# Patient Record
Sex: Male | Born: 1955 | Race: White | Hispanic: No | Smoking: Former smoker
Health system: Southern US, Community
[De-identification: ages and names within clinical notes are randomized; demographics above are authoritative.]

---

## 2021-10-18 ENCOUNTER — Inpatient Hospital Stay (HOSPITAL_COMMUNITY)
Admission: EM | Admit: 2021-10-18 | Discharge: 2021-10-21 | DRG: 291 | Disposition: A | Discharge status: Home / Self Care | Payer: Medicare Other | Attending: Family Medicine | Admitting: Family Medicine

## 2021-10-18 ENCOUNTER — Other Ambulatory Visit: Payer: Self-pay

## 2021-10-18 ENCOUNTER — Emergency Department (HOSPITAL_COMMUNITY): Payer: Medicare Other

## 2021-10-18 ENCOUNTER — Encounter (HOSPITAL_COMMUNITY): Payer: Self-pay

## 2021-10-18 DIAGNOSIS — I110 Hypertensive heart disease with heart failure: Secondary | ICD-10-CM | POA: Diagnosis not present

## 2021-10-18 DIAGNOSIS — I130 Hypertensive heart and chronic kidney disease with heart failure and stage 1 through stage 4 chronic kidney disease, or unspecified chronic kidney disease: Secondary | ICD-10-CM | POA: Diagnosis not present

## 2021-10-18 DIAGNOSIS — N1831 Chronic kidney disease, stage 3a: Secondary | ICD-10-CM | POA: Diagnosis present

## 2021-10-18 DIAGNOSIS — F1011 Alcohol abuse, in remission: Secondary | ICD-10-CM | POA: Diagnosis present

## 2021-10-18 DIAGNOSIS — T465X6A Underdosing of other antihypertensive drugs, initial encounter: Secondary | ICD-10-CM | POA: Diagnosis present

## 2021-10-18 DIAGNOSIS — N179 Acute kidney failure, unspecified: Secondary | ICD-10-CM | POA: Diagnosis present

## 2021-10-18 DIAGNOSIS — I160 Hypertensive urgency: Secondary | ICD-10-CM | POA: Diagnosis present

## 2021-10-18 DIAGNOSIS — I517 Cardiomegaly: Secondary | ICD-10-CM | POA: Diagnosis not present

## 2021-10-18 DIAGNOSIS — I43 Cardiomyopathy in diseases classified elsewhere: Secondary | ICD-10-CM | POA: Diagnosis present

## 2021-10-18 DIAGNOSIS — Z20822 Contact with and (suspected) exposure to covid-19: Secondary | ICD-10-CM | POA: Diagnosis present

## 2021-10-18 DIAGNOSIS — R7989 Other specified abnormal findings of blood chemistry: Secondary | ICD-10-CM | POA: Diagnosis present

## 2021-10-18 DIAGNOSIS — I509 Heart failure, unspecified: Secondary | ICD-10-CM | POA: Diagnosis not present

## 2021-10-18 DIAGNOSIS — Z91138 Patient's unintentional underdosing of medication regimen for other reason: Secondary | ICD-10-CM

## 2021-10-18 DIAGNOSIS — F17210 Nicotine dependence, cigarettes, uncomplicated: Secondary | ICD-10-CM | POA: Diagnosis present

## 2021-10-18 DIAGNOSIS — I7121 Aneurysm of the ascending aorta, without rupture: Secondary | ICD-10-CM | POA: Diagnosis present

## 2021-10-18 DIAGNOSIS — J811 Chronic pulmonary edema: Secondary | ICD-10-CM | POA: Diagnosis not present

## 2021-10-18 DIAGNOSIS — R0602 Shortness of breath: Secondary | ICD-10-CM | POA: Diagnosis not present

## 2021-10-18 DIAGNOSIS — I5041 Acute combined systolic (congestive) and diastolic (congestive) heart failure: Secondary | ICD-10-CM | POA: Diagnosis present

## 2021-10-18 DIAGNOSIS — R778 Other specified abnormalities of plasma proteins: Secondary | ICD-10-CM | POA: Diagnosis present

## 2021-10-18 DIAGNOSIS — F1410 Cocaine abuse, uncomplicated: Secondary | ICD-10-CM | POA: Diagnosis present

## 2021-10-18 DIAGNOSIS — N1832 Chronic kidney disease, stage 3b: Secondary | ICD-10-CM | POA: Diagnosis present

## 2021-10-18 DIAGNOSIS — I248 Other forms of acute ischemic heart disease: Secondary | ICD-10-CM | POA: Diagnosis present

## 2021-10-18 DIAGNOSIS — E876 Hypokalemia: Secondary | ICD-10-CM | POA: Diagnosis present

## 2021-10-18 NOTE — ED Notes (Signed)
Blue Top tube sent to lab.

## 2021-10-18 NOTE — ED Triage Notes (Signed)
Pt complains of SHOB x 2 days. Pt states that he has high blood pressure and has been taking his medication.

## 2021-10-19 ENCOUNTER — Observation Stay (HOSPITAL_COMMUNITY): Payer: Medicare Other

## 2021-10-19 ENCOUNTER — Encounter (HOSPITAL_COMMUNITY): Payer: Self-pay | Admitting: Internal Medicine

## 2021-10-19 DIAGNOSIS — Z91138 Patient's unintentional underdosing of medication regimen for other reason: Secondary | ICD-10-CM | POA: Diagnosis not present

## 2021-10-19 DIAGNOSIS — I428 Other cardiomyopathies: Secondary | ICD-10-CM | POA: Diagnosis not present

## 2021-10-19 DIAGNOSIS — E876 Hypokalemia: Secondary | ICD-10-CM | POA: Diagnosis present

## 2021-10-19 DIAGNOSIS — F1410 Cocaine abuse, uncomplicated: Secondary | ICD-10-CM | POA: Diagnosis present

## 2021-10-19 DIAGNOSIS — I160 Hypertensive urgency: Secondary | ICD-10-CM | POA: Diagnosis present

## 2021-10-19 DIAGNOSIS — I509 Heart failure, unspecified: Secondary | ICD-10-CM

## 2021-10-19 DIAGNOSIS — R7989 Other specified abnormal findings of blood chemistry: Secondary | ICD-10-CM

## 2021-10-19 DIAGNOSIS — I5041 Acute combined systolic (congestive) and diastolic (congestive) heart failure: Secondary | ICD-10-CM | POA: Diagnosis present

## 2021-10-19 DIAGNOSIS — F17210 Nicotine dependence, cigarettes, uncomplicated: Secondary | ICD-10-CM

## 2021-10-19 DIAGNOSIS — Z20822 Contact with and (suspected) exposure to covid-19: Secondary | ICD-10-CM | POA: Diagnosis present

## 2021-10-19 DIAGNOSIS — I43 Cardiomyopathy in diseases classified elsewhere: Secondary | ICD-10-CM | POA: Diagnosis present

## 2021-10-19 DIAGNOSIS — N1831 Chronic kidney disease, stage 3a: Secondary | ICD-10-CM | POA: Diagnosis present

## 2021-10-19 DIAGNOSIS — I10 Essential (primary) hypertension: Secondary | ICD-10-CM | POA: Insufficient documentation

## 2021-10-19 DIAGNOSIS — R778 Other specified abnormalities of plasma proteins: Secondary | ICD-10-CM | POA: Diagnosis not present

## 2021-10-19 DIAGNOSIS — I5021 Acute systolic (congestive) heart failure: Secondary | ICD-10-CM | POA: Diagnosis not present

## 2021-10-19 DIAGNOSIS — N179 Acute kidney failure, unspecified: Secondary | ICD-10-CM | POA: Diagnosis not present

## 2021-10-19 DIAGNOSIS — N1832 Chronic kidney disease, stage 3b: Secondary | ICD-10-CM | POA: Diagnosis present

## 2021-10-19 DIAGNOSIS — F1011 Alcohol abuse, in remission: Secondary | ICD-10-CM | POA: Diagnosis present

## 2021-10-19 DIAGNOSIS — I5031 Acute diastolic (congestive) heart failure: Secondary | ICD-10-CM

## 2021-10-19 DIAGNOSIS — J9811 Atelectasis: Secondary | ICD-10-CM | POA: Diagnosis not present

## 2021-10-19 DIAGNOSIS — J439 Emphysema, unspecified: Secondary | ICD-10-CM | POA: Diagnosis not present

## 2021-10-19 DIAGNOSIS — J90 Pleural effusion, not elsewhere classified: Secondary | ICD-10-CM | POA: Diagnosis not present

## 2021-10-19 DIAGNOSIS — T465X6A Underdosing of other antihypertensive drugs, initial encounter: Secondary | ICD-10-CM | POA: Diagnosis present

## 2021-10-19 DIAGNOSIS — I248 Other forms of acute ischemic heart disease: Secondary | ICD-10-CM | POA: Diagnosis present

## 2021-10-19 DIAGNOSIS — I7121 Aneurysm of the ascending aorta, without rupture: Secondary | ICD-10-CM | POA: Diagnosis not present

## 2021-10-19 DIAGNOSIS — I130 Hypertensive heart and chronic kidney disease with heart failure and stage 1 through stage 4 chronic kidney disease, or unspecified chronic kidney disease: Secondary | ICD-10-CM | POA: Diagnosis present

## 2021-10-19 DIAGNOSIS — I700 Atherosclerosis of aorta: Secondary | ICD-10-CM | POA: Diagnosis not present

## 2021-10-19 HISTORY — DX: Other specified abnormal findings of blood chemistry: R79.89

## 2021-10-19 HISTORY — DX: Other specified abnormalities of plasma proteins: R77.8

## 2021-10-19 HISTORY — DX: Hypertensive urgency: I16.0

## 2021-10-19 HISTORY — DX: Chronic kidney disease, stage 3a: N18.31

## 2021-10-19 HISTORY — DX: Cocaine abuse, uncomplicated: F14.10

## 2021-10-19 HISTORY — DX: Hypokalemia: E87.6

## 2021-10-19 HISTORY — DX: Nicotine dependence, cigarettes, uncomplicated: F17.210

## 2021-10-19 HISTORY — DX: Essential (primary) hypertension: I10

## 2021-10-19 HISTORY — DX: Heart failure, unspecified: I50.9

## 2021-10-19 LAB — CBC WITH DIFFERENTIAL/PLATELET
Abs Immature Granulocytes: 0.03 10*3/uL (ref 0.00–0.07)
Abs Immature Granulocytes: 0.05 10*3/uL (ref 0.00–0.07)
Basophils Absolute: 0.1 10*3/uL (ref 0.0–0.1)
Basophils Absolute: 0.1 10*3/uL (ref 0.0–0.1)
Basophils Relative: 1 %
Basophils Relative: 1 %
Eosinophils Absolute: 0.2 10*3/uL (ref 0.0–0.5)
Eosinophils Absolute: 0.2 10*3/uL (ref 0.0–0.5)
Eosinophils Relative: 1 %
Eosinophils Relative: 2 %
HCT: 44.1 % (ref 39.0–52.0)
HCT: 48.7 % (ref 39.0–52.0)
Hemoglobin: 14.9 g/dL (ref 13.0–17.0)
Hemoglobin: 16.1 g/dL (ref 13.0–17.0)
Immature Granulocytes: 0 %
Immature Granulocytes: 0 %
Lymphocytes Relative: 17 %
Lymphocytes Relative: 19 %
Lymphs Abs: 2.3 10*3/uL (ref 0.7–4.0)
Lymphs Abs: 2.5 10*3/uL (ref 0.7–4.0)
MCH: 28.6 pg (ref 26.0–34.0)
MCH: 28.7 pg (ref 26.0–34.0)
MCHC: 33.1 g/dL (ref 30.0–36.0)
MCHC: 33.8 g/dL (ref 30.0–36.0)
MCV: 84.8 fL (ref 80.0–100.0)
MCV: 86.5 fL (ref 80.0–100.0)
Monocytes Absolute: 0.7 10*3/uL (ref 0.1–1.0)
Monocytes Absolute: 0.7 10*3/uL (ref 0.1–1.0)
Monocytes Relative: 5 %
Monocytes Relative: 6 %
Neutro Abs: 11.3 10*3/uL — ABNORMAL HIGH (ref 1.7–7.7)
Neutro Abs: 8.8 10*3/uL — ABNORMAL HIGH (ref 1.7–7.7)
Neutrophils Relative %: 72 %
Neutrophils Relative %: 76 %
Platelets: 190 10*3/uL (ref 150–400)
Platelets: 224 10*3/uL (ref 150–400)
RBC: 5.2 MIL/uL (ref 4.22–5.81)
RBC: 5.63 MIL/uL (ref 4.22–5.81)
RDW: 14 % (ref 11.5–15.5)
RDW: 14.2 % (ref 11.5–15.5)
WBC: 12.1 10*3/uL — ABNORMAL HIGH (ref 4.0–10.5)
WBC: 14.9 10*3/uL — ABNORMAL HIGH (ref 4.0–10.5)
nRBC: 0 % (ref 0.0–0.2)
nRBC: 0 % (ref 0.0–0.2)

## 2021-10-19 LAB — RAPID URINE DRUG SCREEN, HOSP PERFORMED
Amphetamines: NOT DETECTED
Barbiturates: NOT DETECTED
Benzodiazepines: NOT DETECTED
Cocaine: NOT DETECTED
Opiates: NOT DETECTED
Tetrahydrocannabinol: NOT DETECTED

## 2021-10-19 LAB — ECHOCARDIOGRAM COMPLETE
Area-P 1/2: 3.93 cm2
Calc EF: 35.7 %
Height: 75 in
P 1/2 time: 610 msec
S' Lateral: 5.5 cm
Single Plane A2C EF: 42.1 %
Single Plane A4C EF: 26.1 %
Weight: 4000 oz

## 2021-10-19 LAB — COMPREHENSIVE METABOLIC PANEL
ALT: 25 U/L (ref 0–44)
ALT: 28 U/L (ref 0–44)
AST: 23 U/L (ref 15–41)
AST: 26 U/L (ref 15–41)
Albumin: 4.2 g/dL (ref 3.5–5.0)
Albumin: 4.5 g/dL (ref 3.5–5.0)
Alkaline Phosphatase: 70 U/L (ref 38–126)
Alkaline Phosphatase: 73 U/L (ref 38–126)
Anion gap: 10 (ref 5–15)
Anion gap: 7 (ref 5–15)
BUN: 34 mg/dL — ABNORMAL HIGH (ref 8–23)
BUN: 37 mg/dL — ABNORMAL HIGH (ref 8–23)
CO2: 24 mmol/L (ref 22–32)
CO2: 27 mmol/L (ref 22–32)
Calcium: 9.3 mg/dL (ref 8.9–10.3)
Calcium: 9.6 mg/dL (ref 8.9–10.3)
Chloride: 105 mmol/L (ref 98–111)
Chloride: 106 mmol/L (ref 98–111)
Creatinine, Ser: 1.7 mg/dL — ABNORMAL HIGH (ref 0.61–1.24)
Creatinine, Ser: 1.72 mg/dL — ABNORMAL HIGH (ref 0.61–1.24)
GFR, Estimated: 44 mL/min — ABNORMAL LOW (ref >60)
GFR, Estimated: 44 mL/min — ABNORMAL LOW (ref >60)
Glucose, Bld: 110 mg/dL — ABNORMAL HIGH (ref 70–99)
Glucose, Bld: 132 mg/dL — ABNORMAL HIGH (ref 70–99)
Potassium: 3.3 mmol/L — ABNORMAL LOW (ref 3.5–5.1)
Potassium: 3.5 mmol/L (ref 3.5–5.1)
Sodium: 139 mmol/L (ref 135–145)
Sodium: 140 mmol/L (ref 135–145)
Total Bilirubin: 1.7 mg/dL — ABNORMAL HIGH (ref 0.3–1.2)
Total Bilirubin: 2.1 mg/dL — ABNORMAL HIGH (ref 0.3–1.2)
Total Protein: 7.6 g/dL (ref 6.5–8.1)
Total Protein: 8 g/dL (ref 6.5–8.1)

## 2021-10-19 LAB — URINALYSIS, ROUTINE W REFLEX MICROSCOPIC
Bilirubin Urine: NEGATIVE
Glucose, UA: NEGATIVE mg/dL
Hgb urine dipstick: NEGATIVE
Ketones, ur: NEGATIVE mg/dL
Leukocytes,Ua: NEGATIVE
Nitrite: NEGATIVE
Protein, ur: NEGATIVE mg/dL
Specific Gravity, Urine: 1.004 — ABNORMAL LOW (ref 1.005–1.030)
pH: 7 (ref 5.0–8.0)

## 2021-10-19 LAB — RESP PANEL BY RT-PCR (FLU A&B, COVID) ARPGX2
Influenza A by PCR: NEGATIVE
Influenza B by PCR: NEGATIVE
SARS Coronavirus 2 by RT PCR: NEGATIVE

## 2021-10-19 LAB — TROPONIN I (HIGH SENSITIVITY)
Troponin I (High Sensitivity): 89 ng/L — ABNORMAL HIGH (ref <18)
Troponin I (High Sensitivity): 101 ng/L (ref <18)
Troponin I (High Sensitivity): 92 ng/L — ABNORMAL HIGH (ref <18)

## 2021-10-19 LAB — MAGNESIUM: Magnesium: 2.2 mg/dL (ref 1.7–2.4)

## 2021-10-19 LAB — HIV ANTIBODY (ROUTINE TESTING W REFLEX): HIV Screen 4th Generation wRfx: NONREACTIVE

## 2021-10-19 LAB — BRAIN NATRIURETIC PEPTIDE: B Natriuretic Peptide: 608.9 pg/mL — ABNORMAL HIGH (ref 0.0–100.0)

## 2021-10-19 MED ORDER — ONDANSETRON HCL 4 MG/2ML IJ SOLN: 4.0000 mg | Freq: Four times a day (QID) | INTRAMUSCULAR | Status: DC | PRN

## 2021-10-19 MED ORDER — FUROSEMIDE 10 MG/ML IJ SOLN
40.0000 mg | Freq: Two times a day (BID) | INTRAMUSCULAR | Status: DC
  Administered 2021-10-19 – 2021-10-20 (×4): 40 mg via INTRAVENOUS
  Filled 2021-10-19 (×3): qty 4

## 2021-10-19 MED ORDER — ACETAMINOPHEN 650 MG RE SUPP: 650.0000 mg | Freq: Four times a day (QID) | RECTAL | Status: DC | PRN

## 2021-10-19 MED ORDER — ENOXAPARIN SODIUM 40 MG/0.4ML IJ SOSY
40.0000 mg | PREFILLED_SYRINGE | INTRAMUSCULAR | Status: DC
  Administered 2021-10-19 – 2021-10-21 (×3): 40 mg via SUBCUTANEOUS
  Filled 2021-10-19 (×3): qty 0.4

## 2021-10-19 MED ORDER — ACETAMINOPHEN 325 MG PO TABS: 650.0000 mg | ORAL_TABLET | Freq: Four times a day (QID) | ORAL | Status: DC | PRN

## 2021-10-19 MED ORDER — NITROGLYCERIN 2 % TD OINT
1.0000 [in_us] | TOPICAL_OINTMENT | Freq: Once | TRANSDERMAL | Status: AC
  Administered 2021-10-19: 1 [in_us] via TOPICAL
  Filled 2021-10-19: qty 1

## 2021-10-19 MED ORDER — FUROSEMIDE 10 MG/ML IJ SOLN
40.0000 mg | Freq: Once | INTRAMUSCULAR | Status: AC
Start: 2021-10-19 — End: 2021-10-19
  Administered 2021-10-19: 40 mg via INTRAVENOUS
  Filled 2021-10-19: qty 4

## 2021-10-19 MED ORDER — HYDRALAZINE HCL 20 MG/ML IJ SOLN: 10.0000 mg | Freq: Four times a day (QID) | INTRAMUSCULAR | Status: DC | PRN

## 2021-10-19 MED ORDER — ONDANSETRON HCL 4 MG PO TABS: 4.0000 mg | ORAL_TABLET | Freq: Four times a day (QID) | ORAL | Status: DC | PRN

## 2021-10-19 MED ORDER — PERFLUTREN LIPID MICROSPHERE
1.0000 mL | INTRAVENOUS | Status: AC | PRN
  Administered 2021-10-19: 2 mL via INTRAVENOUS
  Filled 2021-10-19: qty 10

## 2021-10-19 MED ORDER — POLYETHYLENE GLYCOL 3350 17 G PO PACK: 17.0000 g | PACK | Freq: Every day | ORAL | Status: DC | PRN

## 2021-10-19 MED ORDER — NITROGLYCERIN 0.4 MG SL SUBL: 0.4000 mg | SUBLINGUAL_TABLET | SUBLINGUAL | Status: DC | PRN

## 2021-10-19 MED ORDER — POTASSIUM CHLORIDE CRYS ER 20 MEQ PO TBCR
40.0000 meq | EXTENDED_RELEASE_TABLET | Freq: Once | ORAL | Status: AC
  Administered 2021-10-19: 40 meq via ORAL
  Filled 2021-10-19: qty 2

## 2021-10-19 NOTE — Care Plan (Signed)
This 66 years old male with PMH significant for hypertension, medication noncompliance presented to the ED with complaints of shortness of breath.  Patient reports shortness of breath gets worse when he tries to lie flat and is associated with paroxysmal nocturnal dyspnea.  Patient also has bilateral pedal edema which has been occurring for same duration.  Patient is found to have markedly elevated BNP 608 slightly elevated troponins chest x-ray shows moderate pulmonary edema.  Patient is admitted for acute congestive heart failure and is started on IV Lasix.  Troponin is slightly trending up, patient denies any chest pain.  Blood pressure has improved.  Patient has been the regular user of cocaine which is contributing to patient's condition.  Echocardiogram shows EF 35 to 40%.  Patient was seen and examined at bedside.  He reports feeling better,  denies any chest pain or other symptoms.

## 2021-10-19 NOTE — ED Notes (Signed)
Introduced myself to patient and wife. Patient is alert and oriented X4. Patient does not report any chest pain or shortness of breath, however he is using oxygen via nasal cannual.

## 2021-10-19 NOTE — Assessment & Plan Note (Signed)
·   No previous chemistries on file to determine what patient's baseline creatinine is however I presume that this is essentially patient's baseline creatinine considering the BUN/creatinine ratio  Strict input and output monitoring  Monitor renal function and electrolytes with serial chemistries

## 2021-10-19 NOTE — Assessment & Plan Note (Signed)
·   Patient presenting with markedly elevated blood pressure  ER provider initially placed patient on nitroglycerin patch however will attempt to take this off and transition patient to as needed intravenous and hypertensives as we titrate oral meds  Continue to provide as needed intravenous interim tenses for markedly elevated blood pressure  Target blood pressure reduction of 25% in the first 24 hours

## 2021-10-19 NOTE — Assessment & Plan Note (Signed)
·   Replacing with oral potassium chloride  Monitoring potassium with serial chemistries

## 2021-10-19 NOTE — ED Notes (Signed)
Save blue tube in main lab °

## 2021-10-19 NOTE — ED Provider Notes (Signed)
[facility]-EMERGENCY DEPT Provider Note   CSN: [id number] Arrival date & time: 10/18/21  2338     History  Chief Complaint  Patient presents with   Shortness of Breath    Timothy Moon is a 66 y.o. male.  Patient is a 66 year old male with history of hypertension currently taking no medications.  Patient presenting today with complaints of shortness of breath.  This has been worsening over the past 2 days.  Symptoms are worse when he lies flat and ambulates.  He denies to me that he is having any fevers or chills.  He does report some congestion in his chest and cough.  He describes leg swelling.  He had lisinopril and HCTZ leftover from previous prescription and took a dose of this this evening with little relief.  The history is provided by the patient.  Shortness of Breath Severity:  Moderate Onset quality:  Gradual Duration:  2 days Timing:  Constant Progression:  Worsening Chronicity:  New Context: activity   Context: not URI   Relieved by:  Nothing Worsened by:  Exertion and activity (Lying flat) Associated symptoms: no fever       Home Medications Prior to Admission medications   Not on File      Allergies    Patient has no allergy information on record.    Review of Systems   Review of Systems  Constitutional:  Negative for fever.  Respiratory:  Positive for shortness of breath.   All other systems reviewed and are negative.  Physical Exam Updated Vital Signs BP (!) 214/137    Pulse 98    Temp 97.6 F (36.4 C) (Oral)    Resp (!) 24    Ht 6\' 3"  (1.905 m)    Wt 113.4 kg    SpO2 94%    BMI 31.25 kg/m  Physical Exam Vitals and nursing note reviewed.  Constitutional:      General: He is not in acute distress.    Appearance: He is well-developed. He is not diaphoretic.  HENT:     Head: Normocephalic and atraumatic.  Cardiovascular:     Rate and Rhythm: Normal rate and regular rhythm.     Heart sounds: No murmur heard.   No  friction rub.  Pulmonary:     Effort: Pulmonary effort is normal. No respiratory distress.     Breath sounds: Examination of the right-lower field reveals rales. Examination of the left-lower field reveals rales. Rales present. No wheezing.  Abdominal:     General: Bowel sounds are normal. There is no distension.     Palpations: Abdomen is soft.     Tenderness: There is no abdominal tenderness.  Musculoskeletal:        General: Normal range of motion.     Cervical back: Normal range of motion and neck supple.     Right lower leg: No tenderness. Edema present.     Left lower leg: No tenderness. Edema present.     Comments: There is 1+ pitting edema of both lower extremities  Skin:    General: Skin is warm and dry.  Neurological:     Mental Status: He is alert and oriented to person, place, and time.     Coordination: Coordination normal.    ED Results / Procedures / Treatments   Labs (all labs ordered are listed, but only abnormal results are displayed) Labs Reviewed  CBC WITH DIFFERENTIAL/PLATELET - Abnormal; Notable for the following components:  Result Value   WBC 14.9 (*)    Neutro Abs 11.3 (*)    All other components within normal limits  RESP PANEL BY RT-PCR (FLU A&B, COVID) ARPGX2  COMPREHENSIVE METABOLIC PANEL  BRAIN NATRIURETIC PEPTIDE  URINALYSIS, ROUTINE W REFLEX MICROSCOPIC  TROPONIN I (HIGH SENSITIVITY)    EKG EKG Interpretation  Date/Time:  Monday October 18 2021 23:49:04 EST Ventricular Rate:  107 PR Interval:  169 QRS Duration: 106 QT Interval:  364 QTC Calculation: 486 R Axis:   70 Text Interpretation: Sinus tachycardia Atrial premature complex Probable left atrial enlargement Abnormal R-wave progression, late transition Left ventricular hypertrophy Nonspecific T abnormalities, lateral leads Anterior ST elevation, probably due to LVH Borderline prolonged QT interval Confirmed by Geoffery Lyons ([id number]) on 10/19/2021 12:08:05 AM  Radiology DG Chest  Portable 1 View  Result Date: 10/19/2021 CLINICAL DATA:  Shortness of breath for 2 days. EXAM: PORTABLE CHEST 1 VIEW COMPARISON:  None. FINDINGS: The heart is enlarged. Mild aortic tortuosity. Moderate peribronchial and interstitial thickening suspicious for pulmonary edema. Trace fluid in the right minor fissure. No large subpulmonic effusion. No confluent consolidation. No pneumothorax. No acute osseous abnormalities are seen. IMPRESSION: Cardiomegaly with moderate pulmonary edema. Trace fluid in the right minor fissure. Findings likely represent CHF. Electronically Signed   By: Narda Rutherford M.D.   On: 10/19/2021 00:01    Procedures Procedures  Continuous cardiac monitoring  Medications Ordered in ED Medications  furosemide (LASIX) injection 40 mg (has no administration in time range)  nitroGLYCERIN (NITROGLYN) 2 % ointment 1 inch (has no administration in time range)    ED Course/ Medical Decision Making/ A&P  This patient presents to the ED for concern of shortness of breath, this involves an extensive number of treatment options, and is a complaint that carries with it a high risk of complications and morbidity.  The differential diagnosis includes acute onset of CHF, pulmonary embolism, acute urinary syndrome, infection   Co morbidities that complicate the patient evaluation  None   Additional history obtained:  There are no pertinent documents from outside facilities and no additional history required from other individuals.   Lab Tests:  I Ordered, and personally interpreted labs.  The pertinent results include: Elevation of BNP and troponin   Imaging Studies ordered:  I ordered imaging studies including chest x-ray I independently visualized and interpreted imaging which showed pulmonary edema I agree with the radiologist interpretation   Cardiac Monitoring:  The patient was maintained on a cardiac monitor.  I personally viewed and interpreted the cardiac  monitored which showed an underlying rhythm of: Sinus rhythm   Medicines ordered and prescription drug management:  I ordered medication including Lasix and nitroglycerin for treatment of acute congestive heart failure Reevaluation of the patient after these medicines showed that the patient improved I have reviewed the patients home medicines and have made adjustments as needed   Test Considered:  No other tests considered, ordered, or indicated   Critical Interventions:  Diuresis and treatment with nitroglycerin paste   Consultations Obtained:  I requested consultation with the hospitalist,  and discussed lab and imaging findings as well as pertinent plan - they recommend: Admission for diuresis and further work-up.   Problem List / ED Course:  Patient with history of untreated hypertension presenting with shortness of breath worsening over the past several days.  Patient is in acute congestive heart failure based on clinical findings, chest x-ray, and laboratory studies.  I suspect this is related to uncontrolled  hypertension. I feel as though patient will require admission for diuresis, blood pressure control, and likely will require echocardiogram to evaluate his cardiac function.   Reevaluation:  After the interventions noted above, I reevaluated the patient and found that they have :improved   Social Determinants of Health:  None   Dispostion:  After consideration of the diagnostic results and the patients response to treatment, I feel that the patent would benefit from admission for further diuresis, blood pressure control, and cardiac work-up.  CRITICAL CARE Performed by: Geoffery Lyons Total critical care time: 35 minutes Critical care time was exclusive of separately billable procedures and treating other patients. Critical care was necessary to treat or prevent imminent or life-threatening deterioration. Critical care was time spent personally by me on the  following activities: development of treatment plan with patient and/or surrogate as well as nursing, discussions with consultants, evaluation of patient's response to treatment, examination of patient, obtaining history from patient or surrogate, ordering and performing treatments and interventions, ordering and review of laboratory studies, ordering and review of radiographic studies, pulse oximetry and re-evaluation of patient's condition.     Final Clinical Impression(s) / ED Diagnoses Final diagnoses:  None    Rx / DC Orders ED Discharge Orders     None         Geoffery Lyons, MD 10/19/21 [phone]

## 2021-10-19 NOTE — Assessment & Plan Note (Signed)
·   Slightly elevated high-sensitivity troponins in the setting of hypertensive urgency and acute cardiogenic pulmonary edema  Patient denies chest pain  While there is ST segment elevation in the anterior septal leads of the EKG morphology is more consistent with LVH  Continue to cycle cardiac enzymes  Monitoring on telemetry  As needed sublingual nitroglycerin for episodes of chest pain

## 2021-10-19 NOTE — H&P (Signed)
History and Physical    Timothy Moon [id number] DOB: 1956/05/08 DOA: 10/18/2021  PCP: Pcp, No  Patient coming from: Home   Chief Complaint:  Chief Complaint  Patient presents with   Shortness of Breath     HPI:    66 year old male with past medical history of hypertension, medical noncompliance who presents to [facility] emergency department with complaints of shortness of breath.  Patient explains that for at least the past 2 days he has been experiencing shortness of breath.  Shortness of breath has been moderate-severe in intensity, worse with exertion and improved with rest.  Patient notes that his shortness of breath is worse when he attempts to lie flat and is associated with paroxysmal nocturnal dyspnea.  Patient also describes associated bilateral lower extremity edema that has been occurring over the same span of time.  Patient also complains of nonproductive cough.  Patient denies fevers, sick contacts, recent travel or contact with confirmed COVID-19 infection.  Patient symptoms of shortness of breath became so severe that the patient presented to [facility] emergency department for evaluation.  Upon evaluation in the emergency department patient was found to have a markedly elevated BNP of 608.  Patient was also found to have slightly elevated troponins of 89 and 101.  Chest x-ray revealed moderate pulmonary edema.  Patient was felt to be suffering from acute congestive heart failure and was administered 40 mg of intravenous Lasix.  The hospitalist group was then called to assess the patient for admission to the hospital.  Review of Systems:   Review of Systems  Respiratory:  Positive for cough and shortness of breath.   Cardiovascular:  Positive for orthopnea and leg swelling.   Past Medical History:  Diagnosis Date   Essential hypertension 10/19/2021    History reviewed. No pertinent surgical history.   reports that he has never smoked. He  has never used smokeless tobacco. He reports that he does not drink alcohol and does not use drugs.  Not on File  Family History  Problem Relation Age of Onset   Heart disease Neg Hx      Prior to Admission medications   Not on File    Physical Exam: Vitals:   10/19/21 0015 10/19/21 0030 10/19/21 0115 10/19/21 0700  BP: (!) 188/121 (!) 183/126 (!) 189/118 (!) 161/129  Pulse: 94 97 94 89  Resp: (!) 22 18 (!) 21 19  Temp:      TempSrc:      SpO2: 96% 95% 95% 98%  Weight:      Height:       Patient is in mild respiratory distress. Constitutional: Awake alert and oriented x3, no associated distress.   Skin: no rashes, no lesions, good skin turgor noted. Eyes: Pupils are equally reactive to light.  No evidence of scleral icterus or conjunctival pallor.  ENMT: Moist mucous membranes noted.  Posterior pharynx clear of any exudate or lesions.   Neck: normal, supple, no masses, no thyromegaly.  Markedly elevated jugular venous pulse at 45 degrees. Respiratory: Coarse rales in the bilateral mid and lower fields.  No evidence of associated wheezing.  Slightly increased respiratory effort without accessory muscle use.  Cardiovascular: Regular rate and rhythm, no murmurs / rubs / gallops. No extremity edema. 2+ pedal pulses. No carotid bruits.  Chest:   Nontender without crepitus or deformity.   Back:   Nontender without crepitus or deformity. Abdomen: Abdomen is soft and nontender.  No evidence of intra-abdominal masses.  Positive bowel sounds noted in all quadrants.   Musculoskeletal: No joint deformity upper and lower extremities. Good ROM, no contractures. Normal muscle tone.  Neurologic: CN 2-12 grossly intact. Sensation intact.  Patient moving all 4 extremities spontaneously.  Patient is following all commands.  Patient is responsive to verbal stimuli.   Psychiatric: Patient exhibits normal mood with appropriate affect.  Patient seems to possess insight as to their current situation.      Labs on Admission: I have personally reviewed following labs and imaging studies -   CBC: Recent Labs  Lab 10/18/21 2351 10/19/21 0500  WBC 14.9* 12.1*  NEUTROABS 11.3* 8.8*  HGB 16.1 14.9  HCT 48.7 44.1  MCV 86.5 84.8  PLT 224 [id number]   Basic Metabolic Panel: Recent Labs  Lab 10/18/21 2351 10/19/21 0500  NA 139 140  K 3.3* 3.5  CL 105 106  CO2 24 27  GLUCOSE 132* 110*  BUN 34* 37*  CREATININE 1.72* 1.70*  CALCIUM 9.6 9.3  MG  --  2.2   GFR: Estimated Creatinine Clearance: 58.9 mL/min (A) (by C-G formula based on SCr of 1.7 mg/dL (H)). Liver Function Tests: Recent Labs  Lab 10/18/21 2351 10/19/21 0500  AST 26 23  ALT 28 25  ALKPHOS 73 70  BILITOT 1.7* 2.1*  PROT 8.0 7.6  ALBUMIN 4.5 4.2   No results for input(s): LIPASE, AMYLASE in the last 168 hours. No results for input(s): AMMONIA in the last 168 hours. Coagulation Profile: No results for input(s): INR, PROTIME in the last 168 hours. Cardiac Enzymes: No results for input(s): CKTOTAL, CKMB, CKMBINDEX, TROPONINI in the last 168 hours. BNP (last 3 results) No results for input(s): PROBNP in the last 8760 hours. HbA1C: No results for input(s): HGBA1C in the last 72 hours. CBG: No results for input(s): GLUCAP in the last 168 hours. Lipid Profile: No results for input(s): CHOL, HDL, LDLCALC, TRIG, CHOLHDL, LDLDIRECT in the last 72 hours. Thyroid Function Tests: No results for input(s): TSH, T4TOTAL, FREET4, T3FREE, THYROIDAB in the last 72 hours. Anemia Panel: No results for input(s): VITAMINB12, FOLATE, FERRITIN, TIBC, IRON, RETICCTPCT in the last 72 hours. Urine analysis:    Component Value Date/Time   COLORURINE COLORLESS (A) 10/19/2021 0300   APPEARANCEUR CLEAR 10/19/2021 0300   LABSPEC 1.004 (L) 10/19/2021 0300   PHURINE 7.0 10/19/2021 0300   GLUCOSEU NEGATIVE 10/19/2021 0300   HGBUR NEGATIVE 10/19/2021 0300   BILIRUBINUR NEGATIVE 10/19/2021 0300   KETONESUR NEGATIVE 10/19/2021 0300    PROTEINUR NEGATIVE 10/19/2021 0300   NITRITE NEGATIVE 10/19/2021 0300   LEUKOCYTESUR NEGATIVE 10/19/2021 0300    Radiological Exams on Admission - Personally Reviewed: DG Chest Portable 1 View  Result Date: 10/19/2021 CLINICAL DATA:  Shortness of breath for 2 days. EXAM: PORTABLE CHEST 1 VIEW COMPARISON:  None. FINDINGS: The heart is enlarged. Mild aortic tortuosity. Moderate peribronchial and interstitial thickening suspicious for pulmonary edema. Trace fluid in the right minor fissure. No large subpulmonic effusion. No confluent consolidation. No pneumothorax. No acute osseous abnormalities are seen. IMPRESSION: Cardiomegaly with moderate pulmonary edema. Trace fluid in the right minor fissure. Findings likely represent CHF. Electronically Signed   By: Keith Rake M.D.   On: 10/19/2021 00:01    EKG: Personally reviewed.  Rhythm is sinus tachycardia with heart rate of 107 bpm.  Left ventricular hypertrophy present.  ST segment elevation noted in the anterior septal leads likely secondary to left ventricular hypertrophy.    Assessment/Plan  * Acute congestive heart failure (  [place]) Patient presenting with 2 to 3-day history of rapidly progressive severe shortness of breath with mild peripheral edema Exam reveals jugular venous distention with significant rales bilaterally Chest x-ray consistent with acute cardiogenic pulmonary edema with elevated BNP all consistent with diagnosis of acute congestive heart failure Patient admits to medication noncompliance with his antihypertensive regimen and also admits to regular cocaine use both of which are likely the culprits in the patient's cardiomyopathy Monitoring patient on telemetry Intravenous Lasix twice daily which can be titrated to more frequent if necessary Strict input and output monitoring Serial chemistries to monitor renal function and electrolytes Echocardiogram in the morning Will consider CHF team consultation based on clinical  course  Hypertensive urgency- (present on admission) Patient presenting with markedly elevated blood pressure ER provider initially placed patient on nitroglycerin patch however will attempt to take this off and transition patient to as needed intravenous and hypertensives as we titrate oral meds Continue to provide as needed intravenous interim tenses for markedly elevated blood pressure Target blood pressure reduction of 25% in the first 24 hours  Elevated troponin level not due myocardial infarction- (present on admission) Slightly elevated high-sensitivity troponins in the setting of hypertensive urgency and acute cardiogenic pulmonary edema Patient denies chest pain While there is ST segment elevation in the anterior septal leads of the EKG morphology is more consistent with LVH Continue to cycle cardiac enzymes Monitoring on telemetry As needed sublingual nitroglycerin for episodes of chest pain   Cocaine abuse ([place])- (present on admission) Patient admits to at least using cocaine twice monthly with last use just several days ago Regular cocaine use is likely a major contributor in the patient's heart disease Counseling on cessation  Chronic kidney disease, stage 3a ([place])- (present on admission) No previous chemistries on file to determine what patient's baseline creatinine is however I presume that this is essentially patient's baseline creatinine considering the BUN/creatinine ratio Strict input and output monitoring Monitor renal function and electrolytes with serial chemistries  Hypokalemia- (present on admission) Replacing with oral potassium chloride Monitoring potassium with serial chemistries  Nicotine dependence, cigarettes, uncomplicated- (present on admission) Counseling patient on cessation daily.      Code Status:  Full code  code status decision has been confirmed with: patient Family Communication: deferred   Status is: Observation  The patient remains  OBS appropriate and will d/c before 2 midnights.       Vernelle Emerald MD [facility] Pager [phone]  If 7PM-7AM, please contact night-coverage [email or web address] Use universal [employer] password for that web site. If you do not have the password, please call the hospital operator.  10/19/2021, 8:23 AM

## 2021-10-19 NOTE — Assessment & Plan Note (Addendum)
·   Patient presenting with 2 to 3-day history of rapidly progressive severe shortness of breath with mild peripheral edema  Exam reveals jugular venous distention with significant rales bilaterally  Chest x-ray consistent with acute cardiogenic pulmonary edema with elevated BNP all consistent with diagnosis of acute congestive heart failure  Patient admits to medication noncompliance with his antihypertensive regimen and also admits to regular cocaine use both of which are likely the culprits in the patient's cardiomyopathy  Monitoring patient on telemetry  Intravenous Lasix twice daily which can be titrated to more frequent if necessary  Strict input and output monitoring  Serial chemistries to monitor renal function and electrolytes  Echocardiogram in the morning  Will consider CHF team consultation based on clinical course

## 2021-10-19 NOTE — Assessment & Plan Note (Signed)
·   Counseling patient on cessation daily.

## 2021-10-19 NOTE — Assessment & Plan Note (Signed)
·   Patient admits to at least using cocaine twice monthly with last use just several days ago  Regular cocaine use is likely a major contributor in the patient's heart disease  Counseling on cessation

## 2021-10-19 NOTE — ED Notes (Signed)
Patient is ready for transport.  

## 2021-10-20 ENCOUNTER — Inpatient Hospital Stay (HOSPITAL_COMMUNITY): Payer: Medicare Other

## 2021-10-20 DIAGNOSIS — I5021 Acute systolic (congestive) heart failure: Secondary | ICD-10-CM

## 2021-10-20 DIAGNOSIS — I428 Other cardiomyopathies: Secondary | ICD-10-CM

## 2021-10-20 DIAGNOSIS — N179 Acute kidney failure, unspecified: Secondary | ICD-10-CM | POA: Diagnosis not present

## 2021-10-20 LAB — CBC
HCT: 44 % (ref 39.0–52.0)
Hemoglobin: 14.5 g/dL (ref 13.0–17.0)
MCH: 28.5 pg (ref 26.0–34.0)
MCHC: 33 g/dL (ref 30.0–36.0)
MCV: 86.6 fL (ref 80.0–100.0)
Platelets: 200 10*3/uL (ref 150–400)
RBC: 5.08 MIL/uL (ref 4.22–5.81)
RDW: 14 % (ref 11.5–15.5)
WBC: 10 10*3/uL (ref 4.0–10.5)
nRBC: 0 % (ref 0.0–0.2)

## 2021-10-20 LAB — BASIC METABOLIC PANEL
Anion gap: 9 (ref 5–15)
BUN: 46 mg/dL — ABNORMAL HIGH (ref 8–23)
CO2: 28 mmol/L (ref 22–32)
Calcium: 9 mg/dL (ref 8.9–10.3)
Chloride: 104 mmol/L (ref 98–111)
Creatinine, Ser: 1.96 mg/dL — ABNORMAL HIGH (ref 0.61–1.24)
GFR, Estimated: 37 mL/min — ABNORMAL LOW (ref >60)
Glucose, Bld: 92 mg/dL (ref 70–99)
Potassium: 3.4 mmol/L — ABNORMAL LOW (ref 3.5–5.1)
Sodium: 141 mmol/L (ref 135–145)

## 2021-10-20 LAB — PHOSPHORUS: Phosphorus: 3.7 mg/dL (ref 2.5–4.6)

## 2021-10-20 LAB — MAGNESIUM: Magnesium: 2.5 mg/dL — ABNORMAL HIGH (ref 1.7–2.4)

## 2021-10-20 MED ORDER — POTASSIUM CHLORIDE 20 MEQ PO PACK
40.0000 meq | PACK | Freq: Once | ORAL | Status: AC
Start: 2021-10-20 — End: 2021-10-20
  Administered 2021-10-20: 40 meq via ORAL
  Filled 2021-10-20: qty 2

## 2021-10-20 MED ORDER — ISOSORBIDE MONONITRATE ER 60 MG PO TB24
60.0000 mg | ORAL_TABLET | Freq: Every day | ORAL | Status: DC
  Administered 2021-10-20 – 2021-10-21 (×2): 60 mg via ORAL
  Filled 2021-10-20 (×2): qty 1

## 2021-10-20 MED ORDER — HYDRALAZINE HCL 25 MG PO TABS
25.0000 mg | ORAL_TABLET | Freq: Three times a day (TID) | ORAL | Status: DC
  Administered 2021-10-20 – 2021-10-21 (×3): 25 mg via ORAL
  Filled 2021-10-20 (×3): qty 1

## 2021-10-20 MED ORDER — CARVEDILOL 3.125 MG PO TABS
3.1250 mg | ORAL_TABLET | Freq: Two times a day (BID) | ORAL | Status: DC
  Administered 2021-10-20 – 2021-10-21 (×2): 3.125 mg via ORAL
  Filled 2021-10-20: qty 1

## 2021-10-20 MED ORDER — DAPAGLIFLOZIN PROPANEDIOL 10 MG PO TABS
10.0000 mg | ORAL_TABLET | Freq: Every day | ORAL | Status: DC
  Administered 2021-10-20 – 2021-10-21 (×2): 10 mg via ORAL
  Filled 2021-10-20 (×2): qty 1

## 2021-10-20 NOTE — Progress Notes (Signed)
PROGRESS NOTE    Timothy Moon  [id number] DOB: June 23, 1956 DOA: 10/18/2021 PCP: Pcp, No    Brief Narrative:  This 66 years old male with PMH significant for hypertension,cocaine abuse, ETOH abuse,  medication noncompliance presented to the ED with complaints of shortness of breath.  Patient reports shortness of breath gets worse when he tries to lie flat and is associated with paroxysmal nocturnal dyspnea.  Patient also has bilateral pedal edema which has been occurring for same duration.  Patient is found to have markedly elevated BNP 608,  Slightly elevated troponins,  Chest x-ray shows moderate pulmonary edema.  Patient is admitted for acute congestive heart failure and is started on IV Lasix. Troponin is slightly trending up, Patient denies any chest pain.  Blood pressure has improved.  Patient has been the regular user of cocaine which is contributing to patient's condition.  Echocardiogram shows EF 35 to 40%.  Cardiology is consulted and medications were adjusted based on low EF and worsening renal functions.  Assessment & Plan:   Principal Problem:   Acute congestive heart failure (HCC) Active Problems:   Hypertensive urgency   Hypokalemia   Chronic kidney disease, stage 3a (HCC)   Elevated troponin level not due myocardial infarction   Cocaine abuse (HCC)   Nicotine dependence, cigarettes, uncomplicated  Acute systolic congestive heart failure: Patient presented with progressive SOB, peripheral edema, elevated BNP,  elevated JVP,  x-ray shows pulmonary edema consistent with CHF exacerbation.   Suspect multifactorial due to medication noncompliance, cocaine use and uncontrolled hypertension. Echocardiogram shows LVEF 35 to 40%.  Cardiology consulted. Continue IV Lasix for diuresis.  Patient does not seem volume overloaded. He is 6 L negative balance, monitor intake output charting. Patient cannot be started on ACE inhibitor's and Entresto given AKI. Cardiology recommended  Coreg,  hydralazine,  nitrates and Farxiga. Consider Entresto once renal functions improves.  Hypertensive urgency: Patient has been having uncontrolled blood pressure for long time Patient is started on Coreg, hydralazine, nitrates given AKI. Continue to monitor blood pressure.  Cocaine abuse: Has been regular user of cocaine, agreeable to stop using cocaine. Social worker consult  AKI on CKD stage IIIa: Does not know about baseline serum creatinine. Serum creatinine is going up with the Lasix 40 mg IV twice daily. Lasix is reduced to 40 mg IV daily, continue to monitor renal functions.  Elevated troponin: Could be due to demand ischemia, Patient denies chest pain and SOB Cardiology is consulted.  DVT prophylaxis: Lovenox Code Status: Full code. Family Communication:  No family at bed side. Disposition Plan:   Status is: Inpatient  Remains inpatient appropriate because:  Admitted for CHF exacerbation requiring IV diuresis.  Medication adjusted for hypertensive urgency.  Consultants:  Cardiology  Procedures: Echocardiogram Antimicrobials: None  Subjective: Patient was seen and examined at bedside.  No overnight events.   Patient reports feeling much improved.  Patient denies any chest pain and shortness of breath.  Objective: Vitals:   10/19/21 2309 10/20/21 0428 10/20/21 0500 10/20/21 0847  BP: (!) 156/90 (!) 139/92  (!) 168/105  Pulse: 81 73  80  Resp: 17 20  19   Temp: 98.2 F (36.8 C) 97.8 F (36.6 C)  97.8 F (36.6 C)  TempSrc: Oral Oral  Oral  SpO2: 98% 98%  92%  Weight:   110.9 kg   Height:        Intake/Output Summary (Last 24 hours) at 10/20/2021 1207 Last data filed at 10/19/2021 2040 Gross per 24 hour  Intake --  Output 1100 ml  Net -1100 ml   Filed Weights   10/18/21 2355 10/20/21 0500  Weight: 113.4 kg 110.9 kg    Examination:  General exam: Appears comfortable, not in any acute distress. Respiratory system: Clear to auscultation  bilaterally, respiratory effort normal, RR 15. Cardiovascular system: S1-S2 heard, regular rate and rhythm, no murmur. Gastrointestinal system: Abdomen is soft, nontender, nondistended, BS+ Central nervous system: Alert and oriented x 3. No focal neurological deficits. Extremities: Edema+, no cyanosis, no clubbing. Skin: No rashes, lesions or ulcers Psychiatry: Judgement and insight appear normal. Mood & affect appropriate.     Data Reviewed: I have personally reviewed following labs and imaging studies  CBC: Recent Labs  Lab 10/18/21 2351 10/19/21 0500 10/20/21 0344  WBC 14.9* 12.1* 10.0  NEUTROABS 11.3* 8.8*  --   HGB 16.1 14.9 14.5  HCT 48.7 44.1 44.0  MCV 86.5 84.8 86.6  PLT 224 190 [id number]   Basic Metabolic Panel: Recent Labs  Lab 10/18/21 2351 10/19/21 0500 10/20/21 0344  NA 139 140 141  K 3.3* 3.5 3.4*  CL 105 106 104  CO2 24 27 28   GLUCOSE 132* 110* 92  BUN 34* 37* 46*  CREATININE 1.72* 1.70* 1.96*  CALCIUM 9.6 9.3 9.0  MG  --  2.2 2.5*  PHOS  --   --  3.7   GFR: Estimated Creatinine Clearance: 50.5 mL/min (A) (by C-G formula based on SCr of 1.96 mg/dL (H)). Liver Function Tests: Recent Labs  Lab 10/18/21 2351 10/19/21 0500  AST 26 23  ALT 28 25  ALKPHOS 73 70  BILITOT 1.7* 2.1*  PROT 8.0 7.6  ALBUMIN 4.5 4.2   No results for input(s): LIPASE, AMYLASE in the last 168 hours. No results for input(s): AMMONIA in the last 168 hours. Coagulation Profile: No results for input(s): INR, PROTIME in the last 168 hours. Cardiac Enzymes: No results for input(s): CKTOTAL, CKMB, CKMBINDEX, TROPONINI in the last 168 hours. BNP (last 3 results) No results for input(s): PROBNP in the last 8760 hours. HbA1C: No results for input(s): HGBA1C in the last 72 hours. CBG: No results for input(s): GLUCAP in the last 168 hours. Lipid Profile: No results for input(s): CHOL, HDL, LDLCALC, TRIG, CHOLHDL, LDLDIRECT in the last 72 hours. Thyroid Function Tests: No results  for input(s): TSH, T4TOTAL, FREET4, T3FREE, THYROIDAB in the last 72 hours. Anemia Panel: No results for input(s): VITAMINB12, FOLATE, FERRITIN, TIBC, IRON, RETICCTPCT in the last 72 hours. Sepsis Labs: No results for input(s): PROCALCITON, LATICACIDVEN in the last 168 hours.  Recent Results (from the past 240 hour(s))  Resp Panel by RT-PCR (Flu A&B, Covid) Nasopharyngeal Swab     Status: None   Collection Time: 10/18/21 11:54 PM   Specimen: Nasopharyngeal Swab; Nasopharyngeal(NP) swabs in vial transport medium  Result Value Ref Range Status   SARS Coronavirus 2 by RT PCR NEGATIVE NEGATIVE Final    Comment: (NOTE) SARS-CoV-2 target nucleic acids are NOT DETECTED.  The SARS-CoV-2 RNA is generally detectable in upper respiratory specimens during the acute phase of infection. The lowest concentration of SARS-CoV-2 viral copies this assay can detect is 138 copies/mL. A negative result does not preclude SARS-Cov-2 infection and should not be used as the sole basis for treatment or other patient management decisions. A negative result may occur with  improper specimen collection/handling, submission of specimen other than nasopharyngeal swab, presence of viral mutation(s) within the areas targeted by this assay, and inadequate number of viral copies(<138  copies/mL). A negative result must be combined with clinical observations, patient history, and epidemiological information. The expected result is Negative.  Fact Sheet for Patients:  EntrepreneurPulse.com.au  Fact Sheet for Healthcare Providers:  IncredibleEmployment.be  This test is no t yet approved or cleared by the [place] FDA and  has been authorized for detection and/or diagnosis of SARS-CoV-2 by FDA under an Emergency Use Authorization (EUA). This EUA will remain  in effect (meaning this test can be used) for the duration of the COVID-19 declaration under Section 564(b)(1) of the Act,  21 U.S.C.section 360bbb-3(b)(1), unless the authorization is terminated  or revoked sooner.       Influenza A by PCR NEGATIVE NEGATIVE Final   Influenza B by PCR NEGATIVE NEGATIVE Final    Comment: (NOTE) The Xpert Xpress SARS-CoV-2/FLU/RSV plus assay is intended as an aid in the diagnosis of influenza from Nasopharyngeal swab specimens and should not be used as a sole basis for treatment. Nasal washings and aspirates are unacceptable for Xpert Xpress SARS-CoV-2/FLU/RSV testing.  Fact Sheet for Patients: EntrepreneurPulse.com.au  Fact Sheet for Healthcare Providers: IncredibleEmployment.be  This test is not yet approved or cleared by the [place] FDA and has been authorized for detection and/or diagnosis of SARS-CoV-2 by FDA under an Emergency Use Authorization (EUA). This EUA will remain in effect (meaning this test can be used) for the duration of the COVID-19 declaration under Section 564(b)(1) of the Act, 21 U.S.C. section 360bbb-3(b)(1), unless the authorization is terminated or revoked.  Performed at [facility], [place] [address] [place], [place] [address]    Radiology Studies: CT CHEST WO CONTRAST  Result Date: 10/20/2021 CLINICAL DATA:  66 year old male with aortic aneurysm. EXAM: CT CHEST WITHOUT CONTRAST TECHNIQUE: Multidetector CT imaging of the chest was performed following the standard protocol without IV contrast. COMPARISON:  None. FINDINGS: Cardiovascular: Calcified coronary artery atherosclerosis. Cardiac size is at the upper limits of normal. No pericardial effusion. Fusiform enlargement of the ascending thoracic aorta measuring 47-49 mm maximal diameter (series 2, image 91, series 4, image 46 and series 5, image 92). The aorta tapers in the arch, and the descending segment does not appear aneurysmal. Mild Calcified aortic atherosclerosis. Vascular patency is not evaluated in the absence of IV contrast.  Mediastinum/Nodes: Negative. No mediastinal mass or lymphadenopathy. Lungs/Pleura: Major airways are patent. Upper lobe predominant centrilobular emphysema is mild. Mild dependent atelectasis and trace layering pleural effusions. Occasional subpleural lung scarring. Upper Abdomen: Negative visible noncontrast liver, gallbladder, spleen, pancreas, adrenal glands and proximal bowel. Diverticulosis of the visible colon. Multiple bilateral renal cysts, have a benign appearance. Renal vascular calcifications at the hilum. Musculoskeletal: Flowing anterior endplate osteophytes result in interbody ankylosis in the midthoracic spine. No acute osseous abnormality identified. IMPRESSION: 1. 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 Recommend semi-annual imaging followup by CTA or MRA and referral to Cardiothoracic Surgery if not already obtained. This recommendation follows 2010 ACCF/AHA/AATS/ACR/ASA/SCA/SCAI/SIR/STS/SVM Guidelines for the Diagnosis and Management of Patients With Thoracic Aortic Disease. Circulation. 2010; [id number]. Aortic aneurysm NOS (ICD10-I71.9). Aortic Atherosclerosis (ICD10-I70.0). 2. Emphysema (ICD10-J43.9). Trace layering pleural effusions and mild pulmonary atelectasis. Electronically Signed   By: Genevie Ann M.D.   On: 10/20/2021 11:50   DG Chest Portable 1 View  Result Date: 10/19/2021 CLINICAL DATA:  Shortness of breath for 2 days. EXAM: PORTABLE CHEST 1 VIEW COMPARISON:  None. FINDINGS: The heart is enlarged. Mild aortic tortuosity. Moderate peribronchial and interstitial thickening suspicious for pulmonary  edema. Trace fluid in the right minor fissure. No large subpulmonic effusion. No confluent consolidation. No pneumothorax. No acute osseous abnormalities are seen. IMPRESSION: Cardiomegaly with moderate pulmonary edema. Trace fluid in the right minor fissure. Findings likely represent CHF. Electronically Signed   By: Keith Rake M.D.    On: 10/19/2021 00:01   ECHOCARDIOGRAM COMPLETE  Result Date: 10/19/2021    ECHOCARDIOGRAM REPORT   Patient Name:   MANDY ENGLIN Date of Exam: 10/19/2021 Medical Rec #:  [id number]      Height:       75.0 in Accession #:    [id number]     Weight:       250.0 lb Date of Birth:  November 10, 1955     BSA:          2.413 m Patient Age:    49 years       BP:           144/101 mmHg Patient Gender: M              HR:           83 bpm. Exam Location:  Inpatient Procedure: 2D Echo, Cardiac Doppler, Color Doppler, 3D Echo and Intracardiac            Opacification Agent Indications:    CHF-Acute Diastolic [id number]  History:        Patient has no prior history of Echocardiogram examinations.                 Arrythmias:Tachycardia, Signs/Symptoms:Shortness of Breath; Risk                 Factors:Hypertension and Current Smoker. Jugular venous                 distention with significant rales bilaterally. Slightly elevated                 high-sensitivity troponins in the setting of hypertensive                 urgency and acute cardiogenic pulmonary edema. Cocaine abuse.                 Chronic kidney disease.  Sonographer:    Darlina Sicilian RDCS Referring Phys: [id number] [employer]  1. Left ventricular ejection fraction, by estimation, is 30 to 35%. The left ventricle has moderately decreased function. The left ventricle demonstrates global hypokinesis. The left ventricular internal cavity size was moderately dilated. There is moderate concentric left ventricular hypertrophy. Left ventricular diastolic parameters are consistent with Grade II diastolic dysfunction (pseudonormalization).  2. Right ventricular systolic function is normal. The right ventricular size is normal.  3. The mitral valve is normal in structure. Mild mitral valve regurgitation. No evidence of mitral stenosis.  4. The aortic valve is normal in structure. Aortic valve regurgitation is mild. Aortic valve sclerosis is present, with no evidence of  aortic valve stenosis.  5. Aneurysm of the ascending aorta, measuring 43 mm.  6. The inferior vena cava is normal in size with greater than 50% respiratory variability, suggesting right atrial pressure of 3 mmHg. Comparison(s): No prior Echocardiogram. FINDINGS  Left Ventricle: Left ventricular ejection fraction, by estimation, is 30 to 35%. The left ventricle has moderately decreased function. The left ventricle demonstrates global hypokinesis. Definity contrast agent was given IV to delineate the left ventricular endocardial borders. The left ventricular internal cavity size was moderately dilated. There is moderate concentric left ventricular hypertrophy. Left ventricular  diastolic parameters are consistent with Grade II diastolic dysfunction (pseudonormalization). Right Ventricle: The right ventricular size is normal. No increase in right ventricular wall thickness. Right ventricular systolic function is normal. Left Atrium: Left atrial size was normal in size. Right Atrium: Right atrial size was normal in size. Pericardium: There is no evidence of pericardial effusion. Mitral Valve: The mitral valve is normal in structure. Mild mitral valve regurgitation. No evidence of mitral valve stenosis. Tricuspid Valve: The tricuspid valve is normal in structure. Tricuspid valve regurgitation is not demonstrated. No evidence of tricuspid stenosis. Aortic Valve: The aortic valve is normal in structure. Aortic valve regurgitation is mild. Aortic regurgitation PHT measures 610 msec. Aortic valve sclerosis is present, with no evidence of aortic valve stenosis. Pulmonic Valve: The pulmonic valve was not well visualized. Pulmonic valve regurgitation is not visualized. No evidence of pulmonic stenosis. Aorta: There is an aneurysm involving the ascending aorta measuring 43 mm. Venous: The inferior vena cava is normal in size with greater than 50% respiratory variability, suggesting right atrial pressure of 3 mmHg. IAS/Shunts: No  atrial level shunt detected by color flow Doppler.  LEFT VENTRICLE PLAX 2D LVIDd:         6.80 cm      Diastology LVIDs:         5.50 cm      LV e' medial:    4.54 cm/s LV PW:         1.35 cm      LV E/e' medial:  14.3 LV IVS:        1.35 cm      LV e' lateral:   4.05 cm/s LVOT diam:     2.50 cm      LV E/e' lateral: 16.1 LV SV:         80 LV SV Index:   33 LVOT Area:     4.91 cm                              3D Volume EF: LV Volumes (MOD)            3D EF:        35 % LV vol d, MOD A2C: 214.0 ml LV EDV:       288 ml LV vol d, MOD A4C: 222.0 ml LV ESV:       187 ml LV vol s, MOD A2C: 124.0 ml LV SV:        101 ml LV vol s, MOD A4C: 164.0 ml LV SV MOD A2C:     90.0 ml LV SV MOD A4C:     222.0 ml LV SV MOD BP:      78.8 ml RIGHT VENTRICLE RV S prime:     14.00 cm/s TAPSE (M-mode): 2.4 cm LEFT ATRIUM             Index        RIGHT ATRIUM           Index LA diam:        4.90 cm 2.03 cm/m   RA Area:     20.50 cm LA Vol (A2C):   67.0 ml 27.77 ml/m  RA Volume:   55.40 ml  22.96 ml/m LA Vol (A4C):   57.5 ml 23.83 ml/m LA Biplane Vol: 62.1 ml 25.74 ml/m  AORTIC VALVE             PULMONIC VALVE LVOT Vmax:  90.10 cm/s  RVOT Peak grad: 2 mmHg LVOT Vmean:  58.000 cm/s LVOT VTI:    0.162 m AI PHT:      610 msec  AORTA Ao Asc diam: 4.30 cm MITRAL VALVE MV Area (PHT): 3.93 cm    SHUNTS MV Decel Time: 193 msec    Systemic VTI:  0.16 m MV E velocity: 65.10 cm/s  Systemic Diam: 2.50 cm MV A velocity: 45.40 cm/s  Pulmonic VTI:  0.097 m MV E/A ratio:  1.43 Kardie Tobb DO Electronically signed by Berniece Salines DO Signature Date/Time: 10/19/2021/1:26:05 PM    Final     Scheduled Meds:  carvedilol  3.125 mg Oral BID WC   dapagliflozin propanediol  10 mg Oral Daily   enoxaparin (LOVENOX) injection  40 mg Subcutaneous Q24H   furosemide  40 mg Intravenous BID   hydrALAZINE  25 mg Oral Q8H   isosorbide mononitrate  60 mg Oral Daily   Continuous Infusions:   LOS: 1 day    Time spent: 45 mins    Cuyler Vandyken, MD [facility]   If 7PM-7AM, please contact night-coverage

## 2021-10-20 NOTE — Consult Note (Addendum)
Cardiology Consultation:   Patient ID: Timothy Moon MRN: [id number]; DOB: [birth year]  Admit date: 10/18/2021 Date of Consult: 10/20/2021  PCP:  Merryl Hacker No   [facility] Providers Cardiologist:  Kirk Ruths, MD new   Patient Profile:   Timothy Moon is a 66 y.o. male with a hx of HTN, cocaine abuse, past alcohol abuse, current smoker, and medication noncompliance who is being seen 10/20/2021 for the evaluation of CHF at the request of Dr. Dwyane Dee.  History of Present Illness:   Timothy Moon presented to [facility] 10/18/20 with complaints of shortness of breath x 2 days with PND and bilateral lower extremity swelling.   BNP 600 CXR with pulmonary edema and cardiomegaly Echocardiogram with LVEF 30-35%, grade 2 DD, and ascending aortic aneurysm of 43 mm. No significant valvular disease.   HS troponin 89 --> 101 --> 92 sCr trending up to 1.96, baseline unknown  UDS negative  He admitted to not taking his hypertension medications and regular use of cocaine. Cardiology was consulted for newly recognized heart failure.   He has diuresed with IV lasix - down 6 lbs overnight. He is receiving 40 mg IV lasix BID.   He denies prior cardiac problems. He lives in [place] but does not take medications and does not see a PCP regularly. He describes 3-4 days of dyspnea and PND, no chest pain, palpitations, or syncope.   He participated in drug rehab in 2012 and has not used alcohol since that time. He used cocaine "frequently" prior to 2012, but now only uses 1-2 times per month. He is willing to abstain from smoking and cocaine use now.  He was incarcerated for 30 days in Jan 2022 and was given HTN medications at that time and left with a prescription he never filled. He admits to SBP in the 200s. This has been long-standing. 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 He has two children, but is estranged from his son. He is divorced.    Past Medical History:  Diagnosis Date   Essential  hypertension 10/19/2021    History reviewed. No pertinent surgical history.   Home Medications:  Prior to Admission medications   Medication Sig Start Date End Date Taking? Authorizing Provider  HYDROCHLOROTHIAZIDE PO Take 1 tablet by mouth daily.   Yes [provider]  LISINOPRIL PO Take 1 tablet by mouth daily.   Yes [provider]    Inpatient Medications: Scheduled Meds:  enoxaparin (LOVENOX) injection  40 mg Subcutaneous Q24H   furosemide  40 mg Intravenous BID   Continuous Infusions:  PRN Meds: acetaminophen **OR** acetaminophen, hydrALAZINE, nitroGLYCERIN, ondansetron **OR** ondansetron (ZOFRAN) IV, polyethylene glycol  Allergies:   No Known Allergies  Social History:   Social History   Socioeconomic History   Marital status: Single    Spouse name: Not on file   Number of children: Not on file   Years of education: Not on file   Highest education level: Not on file  Occupational History   Not on file  Tobacco Use   Smoking status: Never   Smokeless tobacco: Never  Substance and Sexual Activity   Alcohol use: Never   Drug use: Never   Sexual activity: Not on file  Other Topics Concern   Not on file  Social History Narrative   Not on file   Social Determinants of Health   Financial Resource Strain: Not on file  Food Insecurity: Not on file  Transportation Needs: Not on file  Physical Activity: Not on file  Stress: Not on file  Social Connections: Not on file  Intimate Partner Violence: Not on file    Family History:   Family History  Problem Relation Age of Onset   Heart disease Neg Hx      ROS:  Please see the history of present illness.   All other ROS reviewed and negative.     Physical Exam/Data:   Vitals:   10/19/21 2200 10/19/21 2309 10/20/21 0428 10/20/21 0500  BP: 135/73 (!) 156/90 (!) 139/92   Pulse: 84 81 73   Resp: (!) 22 17 20    Temp: (!) 97.4 F (36.3 C) 98.2 F (36.8 C) 97.8 F (36.6 C)   TempSrc: Oral  Oral Oral   SpO2: 97% 98% 98%   Weight:    110.9 kg  Height:        Intake/Output Summary (Last 24 hours) at 10/20/2021 0800 Last data filed at 10/19/2021 2040 Gross per 24 hour  Intake --  Output 1100 ml  Net -1100 ml   Last 3 Weights 10/20/2021 10/18/2021  Weight (lbs) 244 lb 7.8 oz 250 lb  Weight (kg) 110.9 kg 113.399 kg     Body mass index is 30.56 kg/m.  General:  Well nourished, well developed, in no acute distress HEENT: normal Neck: no JVD Vascular: No carotid bruits; Distal pulses 2+ bilaterally Cardiac:  normal S1, S2; RRR; no murmur  Lungs:  clear to auscultation bilaterally, no wheezing, rhonchi or rales  Abd: soft, nontender, no hepatomegaly  Ext: no edema Musculoskeletal:  No deformities, BUE and BLE strength normal and equal Skin: warm and dry  Neuro:  CNs 2-12 intact, no focal abnormalities noted Psych:  Normal affect   EKG:  The EKG was personally reviewed and demonstrates:  sinus tachycardia with HR 107, LVH, ST depression V6, poor R wave progression, artifact but possible ST elevation in precordial leads related to LVH Telemetry:  Telemetry was personally reviewed and demonstrates:  sinus rhythm  Relevant CV Studies:  Echo 10/19/20: 1. Left ventricular ejection fraction, by estimation, is 30 to 35%. The  left ventricle has moderately decreased function. The left ventricle  demonstrates global hypokinesis. The left ventricular internal cavity size  was moderately dilated. There is  moderate concentric left ventricular hypertrophy. Left ventricular  diastolic parameters are consistent with Grade II diastolic dysfunction  (pseudonormalization).   2. Right ventricular systolic function is normal. The right ventricular  size is normal.   3. The mitral valve is normal in structure. Mild mitral valve  regurgitation. No evidence of mitral stenosis.   4. The aortic valve is normal in structure. Aortic valve regurgitation is  mild. Aortic valve sclerosis is present,  with no evidence of aortic valve  stenosis.   5. Aneurysm of the ascending aorta, measuring 43 mm.   6. The inferior vena cava is normal in size with greater than 50%  respiratory variability, suggesting right atrial pressure of 3 mmHg.   Laboratory Data:  High Sensitivity Troponin:   Recent Labs  Lab 10/18/21 2351 10/19/21 0330 10/19/21 1036  TROPONINIHS 89* 101* 92*     Chemistry Recent Labs  Lab 10/18/21 2351 10/19/21 0500 10/20/21 0344  NA 139 140 141  K 3.3* 3.5 3.4*  CL 105 106 104  CO2 24 27 28   GLUCOSE 132* 110* 92  BUN 34* 37* 46*  CREATININE 1.72* 1.70* 1.96*  CALCIUM 9.6 9.3 9.0  MG  --  2.2 2.5*  GFRNONAA 44*  44* 37*  ANIONGAP 10 7 9     Recent Labs  Lab 10/18/21 2351 10/19/21 0500  PROT 8.0 7.6  ALBUMIN 4.5 4.2  AST 26 23  ALT 28 25  ALKPHOS 73 70  BILITOT 1.7* 2.1*   Lipids No results for input(s): CHOL, TRIG, HDL, LABVLDL, LDLCALC, CHOLHDL in the last 168 hours.  Hematology Recent Labs  Lab 10/18/21 2351 10/19/21 0500 10/20/21 0344  WBC 14.9* 12.1* 10.0  RBC 5.63 5.20 5.08  HGB 16.1 14.9 14.5  HCT 48.7 44.1 44.0  MCV 86.5 84.8 86.6  MCH 28.6 28.7 28.5  MCHC 33.1 33.8 33.0  RDW 14.2 14.0 14.0  PLT 224 190 200   Thyroid No results for input(s): TSH, FREET4 in the last 168 hours.  BNP Recent Labs  Lab 10/18/21 2351  BNP 608.9*    DDimer No results for input(s): DDIMER in the last 168 hours.   Radiology/Studies:  DG Chest Portable 1 View  Result Date: 10/19/2021 CLINICAL DATA:  Shortness of breath for 2 days. EXAM: PORTABLE CHEST 1 VIEW COMPARISON:  None. FINDINGS: The heart is enlarged. Mild aortic tortuosity. Moderate peribronchial and interstitial thickening suspicious for pulmonary edema. Trace fluid in the right minor fissure. No large subpulmonic effusion. No confluent consolidation. No pneumothorax. No acute osseous abnormalities are seen. IMPRESSION: Cardiomegaly with moderate pulmonary edema. Trace fluid in the right minor  fissure. Findings likely represent CHF. Electronically Signed   By: Keith Rake M.D.   On: 10/19/2021 00:01   ECHOCARDIOGRAM COMPLETE  Result Date: 10/19/2021    ECHOCARDIOGRAM REPORT   Patient Name:   BENJMAN FREMONT Date of Exam: 10/19/2021 Medical Rec #:  [id number]      Height:       75.0 in Accession #:    [id number]     Weight:       250.0 lb Date of Birth:  01/16/1956     BSA:          2.413 m Patient Age:    82 years       BP:           144/101 mmHg Patient Gender: M              HR:           83 bpm. Exam Location:  Inpatient Procedure: 2D Echo, Cardiac Doppler, Color Doppler, 3D Echo and Intracardiac            Opacification Agent Indications:    CHF-Acute Diastolic [id number]  History:        Patient has no prior history of Echocardiogram examinations.                 Arrythmias:Tachycardia, Signs/Symptoms:Shortness of Breath; Risk                 Factors:Hypertension and Current Smoker. Jugular venous                 distention with significant rales bilaterally. Slightly elevated                 high-sensitivity troponins in the setting of hypertensive                 urgency and acute cardiogenic pulmonary edema. Cocaine abuse.                 Chronic kidney disease.  Sonographer:    Darlina Sicilian RDCS Referring Phys: [id number] [place]  1. Left ventricular ejection  fraction, by estimation, is 30 to 35%. The left ventricle has moderately decreased function. The left ventricle demonstrates global hypokinesis. The left ventricular internal cavity size was moderately dilated. There is moderate concentric left ventricular hypertrophy. Left ventricular diastolic parameters are consistent with Grade II diastolic dysfunction (pseudonormalization).  2. Right ventricular systolic function is normal. The right ventricular size is normal.  3. The mitral valve is normal in structure. Mild mitral valve regurgitation. No evidence of mitral stenosis.  4. The aortic valve is normal in structure.  Aortic valve regurgitation is mild. Aortic valve sclerosis is present, with no evidence of aortic valve stenosis.  5. Aneurysm of the ascending aorta, measuring 43 mm.  6. The inferior vena cava is normal in size with greater than 50% respiratory variability, suggesting right atrial pressure of 3 mmHg. Comparison(s): No prior Echocardiogram. FINDINGS  Left Ventricle: Left ventricular ejection fraction, by estimation, is 30 to 35%. The left ventricle has moderately decreased function. The left ventricle demonstrates global hypokinesis. Definity contrast agent was given IV to delineate the left ventricular endocardial borders. The left ventricular internal cavity size was moderately dilated. There is moderate concentric left ventricular hypertrophy. Left ventricular diastolic parameters are consistent with Grade II diastolic dysfunction (pseudonormalization). Right Ventricle: The right ventricular size is normal. No increase in right ventricular wall thickness. Right ventricular systolic function is normal. Left Atrium: Left atrial size was normal in size. Right Atrium: Right atrial size was normal in size. Pericardium: There is no evidence of pericardial effusion. Mitral Valve: The mitral valve is normal in structure. Mild mitral valve regurgitation. No evidence of mitral valve stenosis. Tricuspid Valve: The tricuspid valve is normal in structure. Tricuspid valve regurgitation is not demonstrated. No evidence of tricuspid stenosis. Aortic Valve: The aortic valve is normal in structure. Aortic valve regurgitation is mild. Aortic regurgitation PHT measures 610 msec. Aortic valve sclerosis is present, with no evidence of aortic valve stenosis. Pulmonic Valve: The pulmonic valve was not well visualized. Pulmonic valve regurgitation is not visualized. No evidence of pulmonic stenosis. Aorta: There is an aneurysm involving the ascending aorta measuring 43 mm. Venous: The inferior vena cava is normal in size with greater  than 50% respiratory variability, suggesting right atrial pressure of 3 mmHg. IAS/Shunts: No atrial level shunt detected by color flow Doppler.  LEFT VENTRICLE PLAX 2D LVIDd:         6.80 cm      Diastology LVIDs:         5.50 cm      LV e' medial:    4.54 cm/s LV PW:         1.35 cm      LV E/e' medial:  14.3 LV IVS:        1.35 cm      LV e' lateral:   4.05 cm/s LVOT diam:     2.50 cm      LV E/e' lateral: 16.1 LV SV:         80 LV SV Index:   33 LVOT Area:     4.91 cm                              3D Volume EF: LV Volumes (MOD)            3D EF:        35 % LV vol d, MOD A2C: 214.0 ml LV EDV:  288 ml LV vol d, MOD A4C: 222.0 ml LV ESV:       187 ml LV vol s, MOD A2C: 124.0 ml LV SV:        101 ml LV vol s, MOD A4C: 164.0 ml LV SV MOD A2C:     90.0 ml LV SV MOD A4C:     222.0 ml LV SV MOD BP:      78.8 ml RIGHT VENTRICLE RV S prime:     14.00 cm/s TAPSE (M-mode): 2.4 cm LEFT ATRIUM             Index        RIGHT ATRIUM           Index LA diam:        4.90 cm 2.03 cm/m   RA Area:     20.50 cm LA Vol (A2C):   67.0 ml 27.77 ml/m  RA Volume:   55.40 ml  22.96 ml/m LA Vol (A4C):   57.5 ml 23.83 ml/m LA Biplane Vol: 62.1 ml 25.74 ml/m  AORTIC VALVE             PULMONIC VALVE LVOT Vmax:   90.10 cm/s  RVOT Peak grad: 2 mmHg LVOT Vmean:  58.000 cm/s LVOT VTI:    0.162 m AI PHT:      610 msec  AORTA Ao Asc diam: 4.30 cm MITRAL VALVE MV Area (PHT): 3.93 cm    SHUNTS MV Decel Time: 193 msec    Systemic VTI:  0.16 m MV E velocity: 65.10 cm/s  Systemic Diam: 2.50 cm MV A velocity: 45.40 cm/s  Pulmonic VTI:  0.097 m MV E/A ratio:  1.43 Kardie Tobb DO Electronically signed by Berniece Salines DO Signature Date/Time: 10/19/2021/1:26:05 PM    Final      Assessment and Plan:   Acute systolic and diastolic heart failure Longstanding untreated hypertension - LVEF 30-35%, grade 2 DD - has diuresed 6 lbs on 40 mg IV lasix BID - has been noncompliant on home lisinopril and HCTZ (dose unknown) - he is not decompensated and  feels much better - volume status improving - continue diuresis and start low dose coreg - BP is in the 130-150s --> will likely start hydralazine + long acting nitrate as pressure tolerates - GDMT will be limited by kidney function - etiology of cardiomyopathy likely multifactorial given longstanding untreated HTN and cocaine use - would benefit from a right and left heart catheterization, but will need to see what his renal function does over the next 24 hrs - will repeat an EKG today given artifact on presenting tracing - hs troponin mild and flat, no chest pain   AKI - unclear what his baseline renal function has been - creatinine rising - reduce lasix to 40 mg IV daily - monitor BMP - likely a component of CKD due to untreated hypertension   Ascending aortic aneurysm - will need to control BP, add BB   Cocaine use Ongoing tobacco use - UDS negative - he is agreeable to abstinence for both   Risk Assessment/Risk Scores:    New York Heart Association (NYHA) Functional Class NYHA Class II   For questions or updates, please contact [facility] Please consult [email or web address] for contact info under   Signed, Ledora Bottcher, PA  10/20/2021 8:00 AM As above, patient seen and examined.  Briefly he is a 65 year old male with history of uncontrolled hypertension, remote alcohol abuse, ongoing cocaine abuse, tobacco abuse for  evaluation of acute systolic congestive heart failure.  Patient states that for the week prior to his admission he was having worsening dyspnea.  Unclear whether there was orthopnea but there was PND.  Also with mild pedal edema.  He denies chest pain, palpitations or syncope.  He was admitted and echocardiogram interpreted as ejection fraction 30 to [id number], grade 2 diastolic dysfunction, mild aortic insufficiency, mild mitral regurgitation and ascending aorta measuring 43 mm.  I personally reviewed and the aortic root also appears to be 5 cm.  Cardiology now  asked to evaluate.  Note he has improved with diuresis. Chest x-ray shows pulmonary edema.  Electrocardiogram shows sinus rhythm with PACs, left ventricular hypertrophy, nonspecific ST changes.  BUN 46 and creatinine 1.96.  BNP 609, troponin 89, 101 and 92, hemoglobin 14.5, HIV negative.  1 acute systolic congestive heart failure-volume status is improving.  We will continue Lasix at present dose.  We will not add spironolactone given renal insufficiency.  We will add Farxiga 10 mg daily.  Follow renal function.  2 cardiomyopathy-etiology unclear but likely hypertensive mediated.  Creatinine is 1.96 and increasing with diuresis.  We will therefore not add ARB or Entresto.  Add hydralazine 25 mg 3 times daily and isosorbide 60 mg daily.  Can consider changing to [facility] later if renal function stable.  We will also begin low-dose carvedilol 3.125 mg twice daily as his CHF is improving and he is not markedly volume overloaded on examination.  Titrate medications both for CHF and for hypertension.  We will need repeat echocardiogram 3 months after medications fully titrated and if ejection fraction less than 35% would need ischemia evaluation at that point.  Would avoid cardiac CTA or catheterization if possible given severity of renal insufficiency.  3 renal insufficiency-duration is unclear as patient has no knowledge of this but has not been followed by physicians previously.  We will need to follow closely with addition of medications.  Likely from longstanding uncontrolled hypertension.  Will need close follow-up with nephrology after discharge.  4 tobacco abuse, substance abuse-patient counseled on avoiding.  5 thoracic aortic aneurysm-his aortic root appears to be 5 cm on my review of echocardiogram and his ascending aorta is also dilated.  Would like to proceed with CTA but I would like to also avoid contrast given the risk of contrast nephropathy.  We will arrange a noncardiac CT to see if we can  better size his aortic root.  Kirk Ruths, MD

## 2021-10-21 ENCOUNTER — Encounter: Payer: Self-pay | Admitting: Cardiology

## 2021-10-21 ENCOUNTER — Other Ambulatory Visit (HOSPITAL_COMMUNITY): Payer: Self-pay

## 2021-10-21 ENCOUNTER — Encounter (HOSPITAL_COMMUNITY): Payer: Self-pay | Admitting: Internal Medicine

## 2021-10-21 ENCOUNTER — Telehealth (HOSPITAL_COMMUNITY): Payer: Self-pay

## 2021-10-21 DIAGNOSIS — I5021 Acute systolic (congestive) heart failure: Secondary | ICD-10-CM | POA: Diagnosis not present

## 2021-10-21 LAB — BASIC METABOLIC PANEL
Anion gap: 9 (ref 5–15)
BUN: 46 mg/dL — ABNORMAL HIGH (ref 8–23)
CO2: 26 mmol/L (ref 22–32)
Calcium: 8.9 mg/dL (ref 8.9–10.3)
Chloride: 102 mmol/L (ref 98–111)
Creatinine, Ser: 2.18 mg/dL — ABNORMAL HIGH (ref 0.61–1.24)
GFR, Estimated: 33 mL/min — ABNORMAL LOW (ref >60)
Glucose, Bld: 99 mg/dL (ref 70–99)
Potassium: 3.6 mmol/L (ref 3.5–5.1)
Sodium: 137 mmol/L (ref 135–145)

## 2021-10-21 MED ORDER — NITROGLYCERIN 0.4 MG SL SUBL
0.4000 mg | SUBLINGUAL_TABLET | SUBLINGUAL | 12 refills | Status: AC | PRN
  Filled 2021-10-21: qty 25, 1d supply, fill #0

## 2021-10-21 MED ORDER — HYDRALAZINE HCL 50 MG PO TABS: 50.0000 mg | ORAL_TABLET | Freq: Three times a day (TID) | ORAL | Status: DC

## 2021-10-21 MED ORDER — HYDRALAZINE HCL 50 MG PO TABS
50.0000 mg | ORAL_TABLET | Freq: Three times a day (TID) | ORAL | 1 refills | Status: DC
  Filled 2021-10-21: qty 90, 30d supply, fill #0

## 2021-10-21 MED ORDER — CARVEDILOL 6.25 MG PO TABS
6.2500 mg | ORAL_TABLET | Freq: Two times a day (BID) | ORAL | Status: DC
  Filled 2021-10-21: qty 1

## 2021-10-21 MED ORDER — ISOSORBIDE MONONITRATE ER 60 MG PO TB24
60.0000 mg | ORAL_TABLET | Freq: Every day | ORAL | 1 refills | Status: DC
  Filled 2021-10-21: qty 30, 30d supply, fill #0

## 2021-10-21 MED ORDER — FUROSEMIDE 40 MG PO TABS
40.0000 mg | ORAL_TABLET | Freq: Every day | ORAL | 1 refills | Status: DC
  Filled 2021-10-21: qty 30, 30d supply, fill #0

## 2021-10-21 MED ORDER — DAPAGLIFLOZIN PROPANEDIOL 10 MG PO TABS
10.0000 mg | ORAL_TABLET | Freq: Every day | ORAL | 1 refills | Status: DC
  Filled 2021-10-21: qty 30, 30d supply, fill #0

## 2021-10-21 MED ORDER — CARVEDILOL 6.25 MG PO TABS
6.2500 mg | ORAL_TABLET | Freq: Two times a day (BID) | ORAL | 1 refills | Status: DC
  Filled 2021-10-21: qty 60, 30d supply, fill #0

## 2021-10-21 MED ORDER — FUROSEMIDE 40 MG PO TABS
40.0000 mg | ORAL_TABLET | Freq: Every day | ORAL | Status: DC
  Administered 2021-10-21: 40 mg via ORAL
  Filled 2021-10-21: qty 1

## 2021-10-21 NOTE — Progress Notes (Signed)
Progress Note  Patient Name: Timothy Moon Date of Encounter: 10/21/2021  [facility] Cardiologist: Timothy Ruths, MD   Subjective   No CP; dyspnea improving  Inpatient Medications    Scheduled Meds:  carvedilol  3.125 mg Oral BID WC   dapagliflozin propanediol  10 mg Oral Daily   enoxaparin (LOVENOX) injection  40 mg Subcutaneous Q24H   furosemide  40 mg Intravenous BID   hydrALAZINE  25 mg Oral Q8H   isosorbide mononitrate  60 mg Oral Daily   Continuous Infusions:  PRN Meds: acetaminophen **OR** acetaminophen, hydrALAZINE, nitroGLYCERIN, ondansetron **OR** ondansetron (ZOFRAN) IV, polyethylene glycol   Vital Signs    Vitals:   10/20/21 1244 10/20/21 2202 10/21/21 0442 10/21/21 0500  BP: (!) 153/96 (!) 135/99 (!) 141/89   Pulse: 77 74 72   Resp: 16 20 20    Temp: 97.9 F (36.6 C) 98 F (36.7 C) 98.2 F (36.8 C)   TempSrc: Oral Oral Oral   SpO2: 92% 94% 94%   Weight:    113.6 kg  Height:        Intake/Output Summary (Last 24 hours) at 10/21/2021 0912 Last data filed at 10/20/2021 1900 Gross per 24 hour  Intake 960 ml  Output 1350 ml  Net -390 ml   Last 3 Weights 10/21/2021 10/20/2021 10/18/2021  Weight (lbs) 250 lb 7.1 oz 244 lb 7.8 oz 250 lb  Weight (kg) 113.6 kg 110.9 kg 113.399 kg      Telemetry    NSR with PVCs - Personally Reviewed   Physical Exam   GEN: No acute distress.   Neck: No JVD Cardiac: RRR Respiratory: Clear to auscultation bilaterally. GI: Soft, nontender, non-distended  MS: No edema Neuro:  Nonfocal  Psych: Normal affect   Labs    High Sensitivity Troponin:   Recent Labs  Lab 10/18/21 2351 10/19/21 0330 10/19/21 1036  TROPONINIHS 89* 101* 92*     Chemistry Recent Labs  Lab 10/18/21 2351 10/19/21 0500 10/20/21 0344 10/21/21 0355  NA 139 140 141 137  K 3.3* 3.5 3.4* 3.6  CL 105 106 104 102  CO2 24 27 28 26   GLUCOSE 132* 110* 92 99  BUN 34* 37* 46* 46*  CREATININE 1.72* 1.70* 1.96* 2.18*  CALCIUM 9.6 9.3 9.0 8.9   MG  --  2.2 2.5*  --   PROT 8.0 7.6  --   --   ALBUMIN 4.5 4.2  --   --   AST 26 23  --   --   ALT 28 25  --   --   ALKPHOS 73 70  --   --   BILITOT 1.7* 2.1*  --   --   GFRNONAA 44* 44* 37* 33*  ANIONGAP 10 7 9 9      Hematology Recent Labs  Lab 10/18/21 2351 10/19/21 0500 10/20/21 0344  WBC 14.9* 12.1* 10.0  RBC 5.63 5.20 5.08  HGB 16.1 14.9 14.5  HCT 48.7 44.1 44.0  MCV 86.5 84.8 86.6  MCH 28.6 28.7 28.5  MCHC 33.1 33.8 33.0  RDW 14.2 14.0 14.0  PLT 224 190 200    BNP Recent Labs  Lab 10/18/21 2351  BNP 608.9*     Radiology    CT CHEST WO CONTRAST  Result Date: 10/20/2021 CLINICAL DATA:  66 year old male with aortic aneurysm. EXAM: CT CHEST WITHOUT CONTRAST TECHNIQUE: Multidetector CT imaging of the chest was performed following the standard protocol without IV contrast. COMPARISON:  None. FINDINGS: Cardiovascular: Calcified coronary  artery atherosclerosis. Cardiac size is at the upper limits of normal. No pericardial effusion. Fusiform enlargement of the ascending thoracic aorta measuring 47-49 mm maximal diameter (series 2, image 91, series 4, image 46 and series 5, image 92). The aorta tapers in the arch, and the descending segment does not appear aneurysmal. Mild Calcified aortic atherosclerosis. Vascular patency is not evaluated in the absence of IV contrast. Mediastinum/Nodes: Negative. No mediastinal mass or lymphadenopathy. Lungs/Pleura: Major airways are patent. Upper lobe predominant centrilobular emphysema is mild. Mild dependent atelectasis and trace layering pleural effusions. Occasional subpleural lung scarring. Upper Abdomen: Negative visible noncontrast liver, gallbladder, spleen, pancreas, adrenal glands and proximal bowel. Diverticulosis of the visible colon. Multiple bilateral renal cysts, have a benign appearance. Renal vascular calcifications at the hilum. Musculoskeletal: Flowing anterior endplate osteophytes result in interbody ankylosis in the  midthoracic spine. No acute osseous abnormality identified. IMPRESSION: 1. 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 Recommend semi-annual imaging followup by CTA or MRA and referral to Cardiothoracic Surgery if not already obtained. This recommendation follows 2010 ACCF/AHA/AATS/ACR/ASA/SCA/SCAI/SIR/STS/SVM Guidelines for the Diagnosis and Management of Patients With Thoracic Aortic Disease. Circulation. 2010; [id number]. Aortic aneurysm NOS (ICD10-I71.9). Aortic Atherosclerosis (ICD10-I70.0). 2. Emphysema (ICD10-J43.9). Trace layering pleural effusions and mild pulmonary atelectasis. Electronically Signed   By: Genevie Ann M.D.   On: 10/20/2021 11:50   ECHOCARDIOGRAM COMPLETE  Result Date: 10/19/2021    ECHOCARDIOGRAM REPORT   Patient Name:   Timothy Moon Date of Exam: 10/19/2021 Medical Rec #:  [id number]      Height:       75.0 in Accession #:    [id number]     Weight:       250.0 lb Date of Birth:  12/05/1955     BSA:          2.413 m Patient Age:    17 years       BP:           144/101 mmHg Patient Gender: M              HR:           83 bpm. Exam Location:  Inpatient Procedure: 2D Echo, Cardiac Doppler, Color Doppler, 3D Echo and Intracardiac            Opacification Agent Indications:    CHF-Acute Diastolic [id number]  History:        Patient has no prior history of Echocardiogram examinations.                 Arrythmias:Tachycardia, Signs/Symptoms:Shortness of Breath; Risk                 Factors:Hypertension and Current Smoker. Jugular venous                 distention with significant rales bilaterally. Slightly elevated                 high-sensitivity troponins in the setting of hypertensive                 urgency and acute cardiogenic pulmonary edema. Cocaine abuse.                 Chronic kidney disease.  Sonographer:    Darlina Sicilian RDCS Referring Phys: [id number] [place]  1. Left ventricular ejection fraction, by estimation, is 30 to  35%. The left ventricle has moderately decreased function. The left ventricle demonstrates global hypokinesis. The  left ventricular internal cavity size was moderately dilated. There is moderate concentric left ventricular hypertrophy. Left ventricular diastolic parameters are consistent with Grade II diastolic dysfunction (pseudonormalization).  2. Right ventricular systolic function is normal. The right ventricular size is normal.  3. The mitral valve is normal in structure. Mild mitral valve regurgitation. No evidence of mitral stenosis.  4. The aortic valve is normal in structure. Aortic valve regurgitation is mild. Aortic valve sclerosis is present, with no evidence of aortic valve stenosis.  5. Aneurysm of the ascending aorta, measuring 43 mm.  6. The inferior vena cava is normal in size with greater than 50% respiratory variability, suggesting right atrial pressure of 3 mmHg. Comparison(s): No prior Echocardiogram. FINDINGS  Left Ventricle: Left ventricular ejection fraction, by estimation, is 30 to 35%. The left ventricle has moderately decreased function. The left ventricle demonstrates global hypokinesis. Definity contrast agent was given IV to delineate the left ventricular endocardial borders. The left ventricular internal cavity size was moderately dilated. There is moderate concentric left ventricular hypertrophy. Left ventricular diastolic parameters are consistent with Grade II diastolic dysfunction (pseudonormalization). Right Ventricle: The right ventricular size is normal. No increase in right ventricular wall thickness. Right ventricular systolic function is normal. Left Atrium: Left atrial size was normal in size. Right Atrium: Right atrial size was normal in size. Pericardium: There is no evidence of pericardial effusion. Mitral Valve: The mitral valve is normal in structure. Mild mitral valve regurgitation. No evidence of mitral valve stenosis. Tricuspid Valve: The tricuspid valve is normal in  structure. Tricuspid valve regurgitation is not demonstrated. No evidence of tricuspid stenosis. Aortic Valve: The aortic valve is normal in structure. Aortic valve regurgitation is mild. Aortic regurgitation PHT measures 610 msec. Aortic valve sclerosis is present, with no evidence of aortic valve stenosis. Pulmonic Valve: The pulmonic valve was not well visualized. Pulmonic valve regurgitation is not visualized. No evidence of pulmonic stenosis. Aorta: There is an aneurysm involving the ascending aorta measuring 43 mm. Venous: The inferior vena cava is normal in size with greater than 50% respiratory variability, suggesting right atrial pressure of 3 mmHg. IAS/Shunts: No atrial level shunt detected by color flow Doppler.  LEFT VENTRICLE PLAX 2D LVIDd:         6.80 cm      Diastology LVIDs:         5.50 cm      LV e' medial:    4.54 cm/s LV PW:         1.35 cm      LV E/e' medial:  14.3 LV IVS:        1.35 cm      LV e' lateral:   4.05 cm/s LVOT diam:     2.50 cm      LV E/e' lateral: 16.1 LV SV:         80 LV SV Index:   33 LVOT Area:     4.91 cm                              3D Volume EF: LV Volumes (MOD)            3D EF:        35 % LV vol d, MOD A2C: 214.0 ml LV EDV:       288 ml LV vol d, MOD A4C: 222.0 ml LV ESV:       187 ml LV vol s,  MOD A2C: 124.0 ml LV SV:        101 ml LV vol s, MOD A4C: 164.0 ml LV SV MOD A2C:     90.0 ml LV SV MOD A4C:     222.0 ml LV SV MOD BP:      78.8 ml RIGHT VENTRICLE RV S prime:     14.00 cm/s TAPSE (M-mode): 2.4 cm LEFT ATRIUM             Index        RIGHT ATRIUM           Index LA diam:        4.90 cm 2.03 cm/m   RA Area:     20.50 cm LA Vol (A2C):   67.0 ml 27.77 ml/m  RA Volume:   55.40 ml  22.96 ml/m LA Vol (A4C):   57.5 ml 23.83 ml/m LA Biplane Vol: 62.1 ml 25.74 ml/m  AORTIC VALVE             PULMONIC VALVE LVOT Vmax:   90.10 cm/s  RVOT Peak grad: 2 mmHg LVOT Vmean:  58.000 cm/s LVOT VTI:    0.162 m AI PHT:      610 msec  AORTA Ao Asc diam: 4.30 cm MITRAL VALVE MV  Area (PHT): 3.93 cm    SHUNTS MV Decel Time: 193 msec    Systemic VTI:  0.16 m MV E velocity: 65.10 cm/s  Systemic Diam: 2.50 cm MV A velocity: 45.40 cm/s  Pulmonic VTI:  0.097 m MV E/A ratio:  1.43 Kardie Tobb DO Electronically signed by Berniece Salines DO Signature Date/Time: 10/19/2021/1:26:05 PM    Final      Patient Profile     66 year old male with history of uncontrolled hypertension, remote alcohol abuse, ongoing cocaine abuse, tobacco abuse for evaluation of acute systolic congestive heart failure.  Patient states that for the week prior to his admission he was having worsening dyspnea.  Unclear whether there was orthopnea but there was PND.  Also with mild pedal edema.  He denies chest pain, palpitations or syncope.  He was admitted and echocardiogram interpreted as ejection fraction 30 to [id number], grade 2 diastolic dysfunction, mild aortic insufficiency, mild mitral regurgitation and ascending aorta measuring 43 mm.  I personally reviewed and the aortic root also appears to be 5 cm.  Cardiology now asked to evaluate.  Note he has improved with diuresis. Chest x-ray shows pulmonary edema.  Electrocardiogram shows sinus rhythm with PACs, left ventricular hypertrophy, nonspecific ST changes.  BUN 46 and creatinine 1.96.  BNP 609, troponin 89, 101 and 92, hemoglobin 14.5, HIV negative.  Assessment & Plan    1 acute systolic congestive heart failure-volume status has improved.  We will change Lasix to 40 mg by mouth daily.  I have not added spironolactone given worsening renal function.  We will continue Farxiga for now discontinued if renal function deteriorates further.    2 cardiomyopathy-etiology unclear but possibly hypertensive mediated.  Renal function has worsened with diuresis and I have therefore not added an ARB or Entresto.  Blood pressure remains elevated.  Increase hydralazine to 50 mg p.o. 3 times daily and continue isosorbide.  Can consider changing to [facility] later if renal function  stable.  Increase carvedilol to 6.25 mg twice daily.  Titrate medications as an outpatient both for CHF and for hypertension.  We will need repeat echocardiogram 3 months after medications fully titrated and if ejection fraction less than 35% would need  ischemia evaluation at that point.  Would avoid cardiac CTA or catheterization if possible given severity of renal insufficiency.   3 renal insufficiency-duration unclear but has not been seen by physicians for quite some time.  Likely secondary to longstanding hypertension.  Would arrange outpatient nephrology evaluation following discharge.  Would also check potassium and renal function 1 week following discharge.     4 tobacco abuse, substance abuse-patient counseled on avoiding.   5 thoracic aortic aneurysm-aortic root measures 4.7 to 4.9 cm on noncontrast chest CT.  Will need repeat study in 6 months.  I avoided CTA or MRA given baseline renal insufficiency and risk of contrast nephropathy.    Patient can be discharged from a cardiac standpoint on present medications.  Will need to bmet in 1 week.  We will arrange follow-up in CHF clinic for medication titration in 2 to 4 weeks.  Follow-up with me in approximately 3 months.  Needs outpatient nephrology evaluation.  For questions or updates, please contact [place] Please consult [email or web address] for contact info under        Signed, Timothy Ruths, MD  10/21/2021, 9:12 AM

## 2021-10-21 NOTE — Discharge Instructions (Addendum)
Advised to follow-up with primary care physician in 1 week. Advised to follow-up with cardiology as  scheduled. Advised to follow-up with nephrology , appointment has been made. Advised to continue current medications as advised.

## 2021-10-21 NOTE — Progress Notes (Signed)
Patient has been explained and taught discharge instructions. Patient has no further questions at this time. IV's have been removed, and sites are clean, dry, and intact.  Sinclair Ship, RN

## 2021-10-21 NOTE — Discharge Summary (Signed)
Physician Discharge Summary  Timothy Moon [id number] DOB: 1956-08-11 DOA: 10/18/2021  PCP: Pcp, No  Admit date: 10/18/2021.  Discharge date: 10/21/2021  Admitted From: Home.  Disposition:  Home  Recommendations for Outpatient Follow-up:  Follow up with PCP in 1-2 weeks. Please obtain BMP/CBC in one week. Advised to follow-up with cardiology as  scheduled. Advised to follow-up with nephrology , appointment has been made. Advised to continue current medications as advised.  Home Health: None Equipment/Devices:None  Discharge Condition: Stable CODE STATUS:Full code Diet recommendation: Heart Healthy   Brief Summary / Hospital Course: This 66 years old male with PMH significant for hypertension, cocaine abuse, ETOH abuse,  medication noncompliance presented to the ED with complaints of shortness of breath.  Patient reports shortness of breath gets worse when he tries to lie flat and is associated with paroxysmal nocturnal dyspnea.  Patient also has bilateral pedal edema which has been occurring for same duration.  Patient is found to have markedly elevated BNP 608,  Slightly elevated troponins,  Chest x-ray showed moderate pulmonary edema.  Patient was admitted for acute congestive heart failure and was started on IV Lasix. Troponin is slightly trended up, Patient denies any chest pain.  Blood pressure has improved.  Patient has been the regular user of cocaine which is contributing to patient's condition.  Echocardiogram shows EF 35 to 40%.  Cardiology was consulted and medications were adjusted based on low EF and worsening renal functions.  Patient was started on hydralazine and nitrates given AKI.  He was not restarted on Entresto given uptrending serum creatinine.  Patient was continued on diuresis.  Patient feels much better, volume status is significantly improved.  Patient is cleared from cardiology to be discharged on below medications.  Patient will need outpatient nephrology  follow-up.  Patient want to be discharged home.  Discharge medications: Coreg 6.25 mg twice daily. Farxiga 10 mg daily Imdur 60 mg daily Hydralazine 50 mg 3 times daily. Lasix 40 mg p.o. daily Nitroglycerin SL as needed.   He was managed for below problems.  Discharge Diagnoses:  Principal Problem:   Acute congestive heart failure (HCC) Active Problems:   Hypertensive urgency   Hypokalemia   Chronic kidney disease, stage 3a (HCC)   Elevated troponin level not due myocardial infarction   Cocaine abuse (HCC)   Nicotine dependence, cigarettes, uncomplicated  Acute systolic congestive heart failure: Patient presented with progressive SOB, peripheral edema, elevated BNP,  elevated JVP,  x-ray shows pulmonary edema consistent with CHF exacerbation.   Suspect multifactorial due to medication noncompliance, cocaine use and uncontrolled hypertension. Echocardiogram shows LVEF 35 to 40%.  Cardiology consulted. Continue IV Lasix for diuresis.  Patient does not seem volume overloaded. He is 6 L negative balance, monitor intake output charting. Patient cannot be started on ACE inhibitor's and Entresto given AKI. Cardiology recommended Coreg,  hydralazine,  nitrates and Farxiga. Consider Entresto once renal functions improves.   Hypertensive urgency: Patient has been having uncontrolled blood pressure for long time. Patient is started on Coreg, hydralazine, and imdur given AKI. Continue to monitor blood pressure. BP improved.   Cocaine abuse: Has been regular user of cocaine, agreeable to stop using cocaine. Social worker consult   AKI on CKD stage IIIa: Does not know about baseline serum creatinine. Serum creatinine is going up with the Lasix 40 mg IV twice daily. Lasix is reduced to 40 mg IV daily, continue to monitor renal functions.   Elevated troponin: Could be due to demand ischemia, Patient denies  chest pain and SOB. Cardiology is consulted.  No ischemia work-up at this  time recommended.  Discharge Instructions  Discharge Instructions     Call MD for:  difficulty breathing, headache or visual disturbances   Complete by: As directed    Call MD for:  persistant dizziness or light-headedness   Complete by: As directed    Call MD for:  persistant nausea and vomiting   Complete by: As directed    Diet - low sodium heart healthy   Complete by: As directed    Diet - low sodium heart healthy   Complete by: As directed    Diet Carb Modified   Complete by: As directed    Discharge instructions   Complete by: As directed    Advised to follow-up with primary care physician in 1 week. Advised to follow-up with cardiology as a scheduled. Advised to follow-up with nephrology , appointment has been made. Advised to continue current medications as advised.   Increase activity slowly   Complete by: As directed    Increase activity slowly   Complete by: As directed       Allergies as of 10/21/2021   No Known Allergies      Medication List     STOP taking these medications    HYDROCHLOROTHIAZIDE PO   LISINOPRIL PO       TAKE these medications    carvedilol 6.25 MG tablet Commonly known as: COREG Take 1 tablet (6.25 mg total) by mouth 2 (two) times daily with a meal.   dapagliflozin propanediol 10 MG Tabs tablet Commonly known as: FARXIGA Take 1 tablet (10 mg total) by mouth daily. Start taking on: October 22, 2021   furosemide 40 MG tablet Commonly known as: LASIX Take 1 tablet (40 mg total) by mouth daily. Start taking on: October 22, 2021   hydrALAZINE 50 MG tablet Commonly known as: APRESOLINE Take 1 tablet (50 mg total) by mouth every 8 (eight) hours.   isosorbide mononitrate 60 MG 24 hr tablet Commonly known as: IMDUR Take 1 tablet (60 mg total) by mouth daily. Start taking on: October 22, 2021   nitroGLYCERIN 0.4 MG SL tablet Commonly known as: NITROSTAT Place 1 tablet (0.4 mg total) under the tongue every 5 (five) minutes as  needed for chest pain.        Follow-up Information     Lelon Perla, MD Follow up in 1 week(s).   Specialty: Cardiology Contact information: [address] [place] [address] [phone]         Roney Jaffe, MD Follow up in 1 week(s).   Specialty: Nephrology Contact information: [place] [address] [phone]                No Known Allergies  Consultations: Cardiology   Procedures/Studies: CT CHEST WO CONTRAST  Result Date: 10/20/2021 CLINICAL DATA:  65 year old male with aortic aneurysm. EXAM: CT CHEST WITHOUT CONTRAST TECHNIQUE: Multidetector CT imaging of the chest was performed following the standard protocol without IV contrast. COMPARISON:  None. FINDINGS: Cardiovascular: Calcified coronary artery atherosclerosis. Cardiac size is at the upper limits of normal. No pericardial effusion. Fusiform enlargement of the ascending thoracic aorta measuring 47-49 mm maximal diameter (series 2, image 91, series 4, image 46 and series 5, image 92). The aorta tapers in the arch, and the descending segment does not appear aneurysmal. Mild Calcified aortic atherosclerosis. Vascular patency is not evaluated in the absence of IV contrast. Mediastinum/Nodes: Negative.  No mediastinal mass or lymphadenopathy. Lungs/Pleura: Major airways are patent. Upper lobe predominant centrilobular emphysema is mild. Mild dependent atelectasis and trace layering pleural effusions. Occasional subpleural lung scarring. Upper Abdomen: Negative visible noncontrast liver, gallbladder, spleen, pancreas, adrenal glands and proximal bowel. Diverticulosis of the visible colon. Multiple bilateral renal cysts, have a benign appearance. Renal vascular calcifications at the hilum. Musculoskeletal: Flowing anterior endplate osteophytes result in interbody ankylosis in the midthoracic spine. No acute osseous abnormality identified. IMPRESSION: 1. 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 Recommend semi-annual imaging followup by CTA or MRA and referral to Cardiothoracic Surgery if not already obtained. This recommendation follows 2010 ACCF/AHA/AATS/ACR/ASA/SCA/SCAI/SIR/STS/SVM Guidelines for the Diagnosis and Management of Patients With Thoracic Aortic Disease. Circulation. 2010; [id number]. Aortic aneurysm NOS (ICD10-I71.9). Aortic Atherosclerosis (ICD10-I70.0). 2. Emphysema (ICD10-J43.9). Trace layering pleural effusions and mild pulmonary atelectasis. Electronically Signed   By: Genevie Ann M.D.   On: 10/20/2021 11:50   DG Chest Portable 1 View  Result Date: 10/19/2021 CLINICAL DATA:  Shortness of breath for 2 days. EXAM: PORTABLE CHEST 1 VIEW COMPARISON:  None. FINDINGS: The heart is enlarged. Mild aortic tortuosity. Moderate peribronchial and interstitial thickening suspicious for pulmonary edema. Trace fluid in the right minor fissure. No large subpulmonic effusion. No confluent consolidation. No pneumothorax. No acute osseous abnormalities are seen. IMPRESSION: Cardiomegaly with moderate pulmonary edema. Trace fluid in the right minor fissure. Findings likely represent CHF. Electronically Signed   By: Keith Rake M.D.   On: 10/19/2021 00:01   ECHOCARDIOGRAM COMPLETE  Result Date: 10/19/2021    ECHOCARDIOGRAM REPORT   Patient Name:   AMJED STRIKE Date of Exam: 10/19/2021 Medical Rec #:  [id number]      Height:       75.0 in Accession #:    [id number]     Weight:       250.0 lb Date of Birth:  1956/09/03     BSA:          2.413 m Patient Age:    32 years       BP:           144/101 mmHg Patient Gender: M              HR:           83 bpm. Exam Location:  Inpatient Procedure: 2D Echo, Cardiac Doppler, Color Doppler, 3D Echo and Intracardiac            Opacification Agent Indications:    CHF-Acute Diastolic [id number]  History:        Patient has no prior history of Echocardiogram examinations.                  Arrythmias:Tachycardia, Signs/Symptoms:Shortness of Breath; Risk                 Factors:Hypertension and Current Smoker. Jugular venous                 distention with significant rales bilaterally. Slightly elevated                 high-sensitivity troponins in the setting of hypertensive                 urgency and acute cardiogenic pulmonary edema. Cocaine abuse.                 Chronic kidney disease.  Sonographer:    Darlina Sicilian RDCS Referring Phys: [id number] Sherryll Burger  [place] IMPRESSIONS  1. Left ventricular ejection fraction, by estimation, is 30 to 35%. The left ventricle has moderately decreased function. The left ventricle demonstrates global hypokinesis. The left ventricular internal cavity size was moderately dilated. There is moderate concentric left ventricular hypertrophy. Left ventricular diastolic parameters are consistent with Grade II diastolic dysfunction (pseudonormalization).  2. Right ventricular systolic function is normal. The right ventricular size is normal.  3. The mitral valve is normal in structure. Mild mitral valve regurgitation. No evidence of mitral stenosis.  4. The aortic valve is normal in structure. Aortic valve regurgitation is mild. Aortic valve sclerosis is present, with no evidence of aortic valve stenosis.  5. Aneurysm of the ascending aorta, measuring 43 mm.  6. The inferior vena cava is normal in size with greater than 50% respiratory variability, suggesting right atrial pressure of 3 mmHg. Comparison(s): No prior Echocardiogram. FINDINGS  Left Ventricle: Left ventricular ejection fraction, by estimation, is 30 to 35%. The left ventricle has moderately decreased function. The left ventricle demonstrates global hypokinesis. Definity contrast agent was given IV to delineate the left ventricular endocardial borders. The left ventricular internal cavity size was moderately dilated. There is moderate concentric left ventricular hypertrophy. Left ventricular diastolic  parameters are consistent with Grade II diastolic dysfunction (pseudonormalization). Right Ventricle: The right ventricular size is normal. No increase in right ventricular wall thickness. Right ventricular systolic function is normal. Left Atrium: Left atrial size was normal in size. Right Atrium: Right atrial size was normal in size. Pericardium: There is no evidence of pericardial effusion. Mitral Valve: The mitral valve is normal in structure. Mild mitral valve regurgitation. No evidence of mitral valve stenosis. Tricuspid Valve: The tricuspid valve is normal in structure. Tricuspid valve regurgitation is not demonstrated. No evidence of tricuspid stenosis. Aortic Valve: The aortic valve is normal in structure. Aortic valve regurgitation is mild. Aortic regurgitation PHT measures 610 msec. Aortic valve sclerosis is present, with no evidence of aortic valve stenosis. Pulmonic Valve: The pulmonic valve was not well visualized. Pulmonic valve regurgitation is not visualized. No evidence of pulmonic stenosis. Aorta: There is an aneurysm involving the ascending aorta measuring 43 mm. Venous: The inferior vena cava is normal in size with greater than 50% respiratory variability, suggesting right atrial pressure of 3 mmHg. IAS/Shunts: No atrial level shunt detected by color flow Doppler.  LEFT VENTRICLE PLAX 2D LVIDd:         6.80 cm      Diastology LVIDs:         5.50 cm      LV e' medial:    4.54 cm/s LV PW:         1.35 cm      LV E/e' medial:  14.3 LV IVS:        1.35 cm      LV e' lateral:   4.05 cm/s LVOT diam:     2.50 cm      LV E/e' lateral: 16.1 LV SV:         80 LV SV Index:   33 LVOT Area:     4.91 cm                              3D Volume EF: LV Volumes (MOD)            3D EF:        35 % LV vol d, MOD A2C: 214.0 ml LV EDV:  288 ml LV vol d, MOD A4C: 222.0 ml LV ESV:       187 ml LV vol s, MOD A2C: 124.0 ml LV SV:        101 ml LV vol s, MOD A4C: 164.0 ml LV SV MOD A2C:     90.0 ml LV SV MOD A4C:      222.0 ml LV SV MOD BP:      78.8 ml RIGHT VENTRICLE RV S prime:     14.00 cm/s TAPSE (M-mode): 2.4 cm LEFT ATRIUM             Index        RIGHT ATRIUM           Index LA diam:        4.90 cm 2.03 cm/m   RA Area:     20.50 cm LA Vol (A2C):   67.0 ml 27.77 ml/m  RA Volume:   55.40 ml  22.96 ml/m LA Vol (A4C):   57.5 ml 23.83 ml/m LA Biplane Vol: 62.1 ml 25.74 ml/m  AORTIC VALVE             PULMONIC VALVE LVOT Vmax:   90.10 cm/s  RVOT Peak grad: 2 mmHg LVOT Vmean:  58.000 cm/s LVOT VTI:    0.162 m AI PHT:      610 msec  AORTA Ao Asc diam: 4.30 cm MITRAL VALVE MV Area (PHT): 3.93 cm    SHUNTS MV Decel Time: 193 msec    Systemic VTI:  0.16 m MV E velocity: 65.10 cm/s  Systemic Diam: 2.50 cm MV A velocity: 45.40 cm/s  Pulmonic VTI:  0.097 m MV E/A ratio:  1.43 Kardie Tobb DO Electronically signed by Berniece Salines DO Signature Date/Time: 10/19/2021/1:26:05 PM    Final     Echocardiogram   Subjective: Patient was seen and examined at bedside.  Overnight events noted.   Patient reports feeling much improved, Volume status has improved.   Patient is cleared from cardiology,  Patient wants to be discharged today.  Discharge Exam: Vitals:   10/20/21 2202 10/21/21 0442  BP: (!) 135/99 (!) 141/89  Pulse: 74 72  Resp: 20 20  Temp: 98 F (36.7 C) 98.2 F (36.8 C)  SpO2: 94% 94%   Vitals:   10/20/21 1244 10/20/21 2202 10/21/21 0442 10/21/21 0500  BP: (!) 153/96 (!) 135/99 (!) 141/89   Pulse: 77 74 72   Resp: 16 20 20    Temp: 97.9 F (36.6 C) 98 F (36.7 C) 98.2 F (36.8 C)   TempSrc: Oral Oral Oral   SpO2: 92% 94% 94%   Weight:    113.6 kg  Height:        General: Pt is alert, awake, not in acute distress Cardiovascular: RRR, S1/S2 +, no rubs, no gallops Respiratory: CTA bilaterally, no wheezing, no rhonchi Abdominal: Soft, NT, ND, bowel sounds + Extremities: no edema, no cyanosis.    The results of significant diagnostics from this hospitalization (including imaging, microbiology,  ancillary and laboratory) are listed below for reference.     Microbiology: Recent Results (from the past 240 hour(s))  Resp Panel by RT-PCR (Flu A&B, Covid) Nasopharyngeal Swab     Status: None   Collection Time: 10/18/21 11:54 PM   Specimen: Nasopharyngeal Swab; Nasopharyngeal(NP) swabs in vial transport medium  Result Value Ref Range Status   SARS Coronavirus 2 by RT PCR NEGATIVE NEGATIVE Final    Comment: (NOTE) SARS-CoV-2 target nucleic acids are NOT  DETECTED.  The SARS-CoV-2 RNA is generally detectable in upper respiratory specimens during the acute phase of infection. The lowest concentration of SARS-CoV-2 viral copies this assay can detect is 138 copies/mL. A negative result does not preclude SARS-Cov-2 infection and should not be used as the sole basis for treatment or other patient management decisions. A negative result may occur with  improper specimen collection/handling, submission of specimen other than nasopharyngeal swab, presence of viral mutation(s) within the areas targeted by this assay, and inadequate number of viral copies(<138 copies/mL). A negative result must be combined with clinical observations, patient history, and epidemiological information. The expected result is Negative.  Fact Sheet for Patients:  EntrepreneurPulse.com.au  Fact Sheet for Healthcare Providers:  IncredibleEmployment.be  This test is no t yet approved or cleared by the [place] FDA and  has been authorized for detection and/or diagnosis of SARS-CoV-2 by FDA under an Emergency Use Authorization (EUA). This EUA will remain  in effect (meaning this test can be used) for the duration of the COVID-19 declaration under Section 564(b)(1) of the Act, 21 U.S.C.section 360bbb-3(b)(1), unless the authorization is terminated  or revoked sooner.       Influenza A by PCR NEGATIVE NEGATIVE Final   Influenza B by PCR NEGATIVE NEGATIVE Final     Comment: (NOTE) The Xpert Xpress SARS-CoV-2/FLU/RSV plus assay is intended as an aid in the diagnosis of influenza from Nasopharyngeal swab specimens and should not be used as a sole basis for treatment. Nasal washings and aspirates are unacceptable for Xpert Xpress SARS-CoV-2/FLU/RSV testing.  Fact Sheet for Patients: EntrepreneurPulse.com.au  Fact Sheet for Healthcare Providers: IncredibleEmployment.be  This test is not yet approved or cleared by the [place] FDA and has been authorized for detection and/or diagnosis of SARS-CoV-2 by FDA under an Emergency Use Authorization (EUA). This EUA will remain in effect (meaning this test can be used) for the duration of the COVID-19 declaration under Section 564(b)(1) of the Act, 21 U.S.C. section 360bbb-3(b)(1), unless the authorization is terminated or revoked.  Performed at [facility], [place] [address] [place], [place] [address]      Labs: BNP (last 3 results) Recent Labs    10/18/21 2351  BNP 0000000*   Basic Metabolic Panel: Recent Labs  Lab 10/18/21 2351 10/19/21 0500 10/20/21 0344 10/21/21 0355  NA 139 140 141 137  K 3.3* 3.5 3.4* 3.6  CL 105 106 104 102  CO2 24 27 28 26   GLUCOSE 132* 110* 92 99  BUN 34* 37* 46* 46*  CREATININE 1.72* 1.70* 1.96* 2.18*  CALCIUM 9.6 9.3 9.0 8.9  MG  --  2.2 2.5*  --   PHOS  --   --  3.7  --    Liver Function Tests: Recent Labs  Lab 10/18/21 2351 10/19/21 0500  AST 26 23  ALT 28 25  ALKPHOS 73 70  BILITOT 1.7* 2.1*  PROT 8.0 7.6  ALBUMIN 4.5 4.2   No results for input(s): LIPASE, AMYLASE in the last 168 hours. No results for input(s): AMMONIA in the last 168 hours. CBC: Recent Labs  Lab 10/18/21 2351 10/19/21 0500 10/20/21 0344  WBC 14.9* 12.1* 10.0  NEUTROABS 11.3* 8.8*  --   HGB 16.1 14.9 14.5  HCT 48.7 44.1 44.0  MCV 86.5 84.8 86.6  PLT 224 190 200   Cardiac Enzymes: No results for input(s):  CKTOTAL, CKMB, CKMBINDEX, TROPONINI in the last 168 hours. BNP: Invalid input(s): POCBNP CBG: No results for input(s): GLUCAP  in the last 168 hours. D-Dimer No results for input(s): DDIMER in the last 72 hours. Hgb A1c No results for input(s): HGBA1C in the last 72 hours. Lipid Profile No results for input(s): CHOL, HDL, LDLCALC, TRIG, CHOLHDL, LDLDIRECT in the last 72 hours. Thyroid function studies No results for input(s): TSH, T4TOTAL, T3FREE, THYROIDAB in the last 72 hours.  Invalid input(s): FREET3 Anemia work up No results for input(s): VITAMINB12, FOLATE, FERRITIN, TIBC, IRON, RETICCTPCT in the last 72 hours. Urinalysis    Component Value Date/Time   COLORURINE COLORLESS (A) 10/19/2021 0300   APPEARANCEUR CLEAR 10/19/2021 0300   LABSPEC 1.004 (L) 10/19/2021 0300   PHURINE 7.0 10/19/2021 0300   GLUCOSEU NEGATIVE 10/19/2021 0300   HGBUR NEGATIVE 10/19/2021 0300   BILIRUBINUR NEGATIVE 10/19/2021 0300   KETONESUR NEGATIVE 10/19/2021 0300   PROTEINUR NEGATIVE 10/19/2021 0300   NITRITE NEGATIVE 10/19/2021 0300   LEUKOCYTESUR NEGATIVE 10/19/2021 0300   Sepsis Labs Invalid input(s): PROCALCITONIN,  WBC,  LACTICIDVEN Microbiology Recent Results (from the past 240 hour(s))  Resp Panel by RT-PCR (Flu A&B, Covid) Nasopharyngeal Swab     Status: None   Collection Time: 10/18/21 11:54 PM   Specimen: Nasopharyngeal Swab; Nasopharyngeal(NP) swabs in vial transport medium  Result Value Ref Range Status   SARS Coronavirus 2 by RT PCR NEGATIVE NEGATIVE Final    Comment: (NOTE) SARS-CoV-2 target nucleic acids are NOT DETECTED.  The SARS-CoV-2 RNA is generally detectable in upper respiratory specimens during the acute phase of infection. The lowest concentration of SARS-CoV-2 viral copies this assay can detect is 138 copies/mL. A negative result does not preclude SARS-Cov-2 infection and should not be used as the sole basis for treatment or other patient management decisions. A  negative result may occur with  improper specimen collection/handling, submission of specimen other than nasopharyngeal swab, presence of viral mutation(s) within the areas targeted by this assay, and inadequate number of viral copies(<138 copies/mL). A negative result must be combined with clinical observations, patient history, and epidemiological information. The expected result is Negative.  Fact Sheet for Patients:  EntrepreneurPulse.com.au  Fact Sheet for Healthcare Providers:  IncredibleEmployment.be  This test is no t yet approved or cleared by the [place] FDA and  has been authorized for detection and/or diagnosis of SARS-CoV-2 by FDA under an Emergency Use Authorization (EUA). This EUA will remain  in effect (meaning this test can be used) for the duration of the COVID-19 declaration under Section 564(b)(1) of the Act, 21 U.S.C.section 360bbb-3(b)(1), unless the authorization is terminated  or revoked sooner.       Influenza A by PCR NEGATIVE NEGATIVE Final   Influenza B by PCR NEGATIVE NEGATIVE Final    Comment: (NOTE) The Xpert Xpress SARS-CoV-2/FLU/RSV plus assay is intended as an aid in the diagnosis of influenza from Nasopharyngeal swab specimens and should not be used as a sole basis for treatment. Nasal washings and aspirates are unacceptable for Xpert Xpress SARS-CoV-2/FLU/RSV testing.  Fact Sheet for Patients: EntrepreneurPulse.com.au  Fact Sheet for Healthcare Providers: IncredibleEmployment.be  This test is not yet approved or cleared by the [place] FDA and has been authorized for detection and/or diagnosis of SARS-CoV-2 by FDA under an Emergency Use Authorization (EUA). This EUA will remain in effect (meaning this test can be used) for the duration of the COVID-19 declaration under Section 564(b)(1) of the Act, 21 U.S.C. section 360bbb-3(b)(1), unless the authorization  is terminated or revoked.  Performed at [facility], [address] [place],  [place] [address]      Time coordinating discharge: Over 30 minutes  SIGNED:   Shawna Clamp, MD  [facility] 10/21/2021, 2:42 PM Pager   If 7PM-7AM, please contact night-coverage

## 2021-10-21 NOTE — Telephone Encounter (Signed)
Heart Failure Nurse Navigator  Received staff message from Micah Flesher, cardiology PA regarding scheduling HV [facility] clinic appointment as pt is discharged from [facility] hospital.  Spoke with patient via phone. Completed SDoH wheel..the patient last use of cocaine with 10/09/21. Drank alcohol (scotch) heavily for 7 years while in [place], quit 2011. Smoked cigarettes for 20 years, 1PPD, quit 10/14/2021.  Pt states he does not have an immediate financial needs, other than he did not pick up his [place] prescription at [facility] pharmacy as it was over $500. Pt did pick up the other medications.   Scheduled HV TOC clinic appt for 1/11 @ 10AM. Pt has private vehicle and drives. Gave directions.   Spoke with Leotis Shames, AHF pharmacist-- plan to hold Marcelline Deist this week, potential start Jardiance with better patient assistance options for patients potential insurance.   Updated patient with new information regarding Farxiga plan. Pt plans to apply for medicare part D prescription coverage. Reminded of HV TOC clinic appt for next Wednesday.  Ozella Rocks, MSN, RN Heart Failure Nurse Navigator [phone]

## 2021-10-21 NOTE — TOC Transition Note (Signed)
Transition of Care [facility]) - CM/SW Discharge Note   Patient Details  Name: Isami Mehra MRN: [id number] Date of Birth: [birth year]  Transition [facility]) CM/SW Contact:  Golda Acre, RN Phone Number: 10/21/2021, 10:00 AM   Clinical Narrative:    TOC NOTE: discharged to home with self care.  Orders checked for toc needs-none found   Final next level of care: Home/Self Care Barriers to Discharge: No Barriers Identified   Patient Goals and CMS Choice Patient states their goals for this hospitalization and ongoing recovery are:: to go home CMS Medicare.gov Compare Post Acute Care list provided to:: Patient    Discharge Placement                       Discharge Plan and Services   Discharge Planning Services: CM Consult                                 Social Determinants of Health (SDOH) Interventions     Readmission Risk Interventions No flowsheet data found.

## 2021-10-25 ENCOUNTER — Other Ambulatory Visit (HOSPITAL_COMMUNITY): Payer: Self-pay

## 2021-10-26 ENCOUNTER — Encounter: Payer: Self-pay | Admitting: Cardiology

## 2021-10-26 NOTE — Progress Notes (Signed)
HEART & VASCULAR TRANSITION OF CARE CONSULT NOTE     Referring Physician: Dr. Jens Som  Primary Care: none  Primary Cardiologist: Olga Millers, MD   HPI: Referred to clinic by Dr. Jens Som for heart failure consultation.   66 y/o male w/ history of poorly controlled HTN, Moderate ETOH use, tobacco use and cocaine use, recently admitted to [facility] 1/23 w/ new systolic heart failure.  Echo showed moderately reduced LVEF 30 to 35%, mod LVH w/ GIIDD, normal RV. Chest CT also showed thoracic aortic aneurysm. Aortic root measures 4.7 to 4.9 cm. Coronary atherosclerosis also noted. UDS negative. Hs Trop 89>>101>>92. Denied ischemic like CP.   He was diuresed w/ IV Lasix. Did not undergo LHC given renal insufficiency, which also limited medical therapy. SCr 2.2 (baseline unknown). No ARNi/ARB/spiro/dig. Placed on Imdur/hydarl, coreg and farxiga. D/c wt 249 lb. Referred to TOC.  Presents to clinic today for f/u. Report doing fairly well symptomatically. No resting dyspnea, orthopnea/PND. NYHA Class II. Denies CP. His weight is up since discharge but no LEE. Does not appear grossly volume overloaded on exam. He reports full med compliance, except w/ Marcelline Deist. Did not pick up from pharmacy due to cost. BP moderately elevated, 160/90. Denies any further tobacco use. Quit ~1 month ago. Denies any further cocaine use. Also reports he was a heavy drinker in the past but quit ~11 years ago. No FH of HD. Denies any recent ischemic like CP. He reports h/o snoring. Has never had sleep study.   ReDs not attempted due to body habitus.   He does not have a PCP. He has basic medical insurance but no coverage for prescription medications, per pt report.  Cardiac Testing   2D Echo 10/19/21 Left ventricular ejection fraction, by estimation, is 30 to 35%. The left ventricle has moderately decreased function. The left ventricle demonstrates global hypokinesis. The left ventricular internal cavity size was moderately  dilated. There is moderate concentric left ventricular hypertrophy. Left ventricular diastolic parameters are consistent with Grade II diastolic dysfunction (pseudonormalization). 1. 2. Right ventricular systolic function is normal. The right ventricular size is normal. The mitral valve is normal in structure. Mild mitral valve regurgitation. No evidence of mitral stenosis. 3. The aortic valve is normal in structure. Aortic valve regurgitation is mild. Aortic valve sclerosis is present, with no evidence of aortic valve stenosis. 4. 5. Aneurysm of the ascending aorta, measuring 43 mm. The inferior vena cava is normal in size with greater than 50% respiratory variability, suggesting right atrial pressure of 3 mmHg.  Review of Systems: [y] = yes, [ ]  = no   General: Weight gain [ ] ; Weight loss [ ] ; Anorexia [ ] ; Fatigue [ ] ; Fever [ ] ; Chills [ ] ; Weakness [ ]   Cardiac: Chest pain/pressure [ ] ; Resting SOB [ ] ; Exertional SOB [ Y]; Orthopnea [ ] ; Pedal Edema [ ] ; Palpitations [ ] ; Syncope [ ] ; Presyncope [ ] ; Paroxysmal nocturnal dyspnea[ ]   Pulmonary: Cough [ ] ; Wheezing[ ] ; Hemoptysis[ ] ; Sputum [ ] ; Snoring [ ]   GI: Vomiting[ ] ; Dysphagia[ ] ; Melena[ ] ; Hematochezia [ ] ; Heartburn[ ] ; Abdominal pain [ ] ; Constipation [ ] ; Diarrhea [ ] ; BRBPR [ ]   GU: Hematuria[ ] ; Dysuria [ ] ; Nocturia[ ]   Vascular: Pain in legs with walking [ ] ; Pain in feet with lying flat [ ] ; Non-healing sores [ ] ; Stroke [ ] ; TIA [ ] ; Slurred speech [ ] ;  Neuro: Headaches[ ] ; Vertigo[ ] ; Seizures[ ] ; Paresthesias[ ] ;Blurred vision [ ] ;  Diplopia [ ] ; Vision changes [ ]   Ortho/Skin: Arthritis [ ] ; Joint pain [ ] ; Muscle pain [ ] ; Joint swelling [ ] ; Back Pain [ ] ; Rash [ ]   Psych: Depression[ ] ; Anxiety[ ]   Heme: Bleeding problems [ ] ; Clotting disorders [ ] ; Anemia [ ]   Endocrine: Diabetes [ ] ; Thyroid dysfunction[ ]    Past Medical History:  Diagnosis Date   Essential hypertension 10/19/2021    Current  Outpatient Medications  Medication Sig Dispense Refill   nitroGLYCERIN (NITROSTAT) 0.4 MG SL tablet Place 1 tablet (0.4 mg total) under the tongue every 5 (five) minutes as needed for chest pain. 30 tablet 12   carvedilol (COREG) 12.5 MG tablet Take 1 tablet (12.5 mg total) by mouth 2 (two) times daily with a meal. 60 tablet 3   dapagliflozin propanediol (FARXIGA) 10 MG TABS tablet Take 1 tablet (10 mg total) by mouth daily. 30 tablet 3   furosemide (LASIX) 40 MG tablet Take 1 tablet (40 mg total) by mouth daily. 30 tablet 3   hydrALAZINE (APRESOLINE) 50 MG tablet Take 1.5 tablets (75 mg total) by mouth every 8 (eight) hours. 135 tablet 3   isosorbide mononitrate (IMDUR) 60 MG 24 hr tablet Take 1 tablet (60 mg total) by mouth daily. 30 tablet 3   No current facility-administered medications for this encounter.    No Known Allergies    Social History   Socioeconomic History   Marital status: Divorced    Spouse name: Not on file   Number of children: 2   Years of education: Not on file   Highest education level: Associate degree: academic program  Occupational History   Occupation: retired  Tobacco Use   Smoking status: Former    Packs/day: 1.00    Years: 20.00    Pack years: 20.00    Types: Cigarettes    Quit date: 10/17/2021    Years since quitting: 0.0   Smokeless tobacco: Never  Vaping Use   Vaping Use: Never used  Substance and Sexual Activity   Alcohol use: Not Currently    Comment: heavy drinker x 7 years, quit 11 years ago.   Drug use: Not Currently    Types: Cocaine    Comment: 1-2x/mo last use 10/09/2021   Sexual activity: Not on file  Other Topics Concern   Not on file  Social History Narrative   Not on file   Social Determinants of Health   Financial Resource Strain: Medium Risk   Difficulty of Paying Living Expenses: Somewhat hard  Food Insecurity: No Food Insecurity   Worried About Running Out of Food in the Last Year: Never true   Ran Out of Food in  the Last Year: Never true  Transportation Needs: No Transportation Needs   Lack of Transportation (Medical): No   Lack of Transportation (Non-Medical): No  Physical Activity: Not on file  Stress: Not on file  Social Connections: Not on file  Intimate Partner Violence: Not on file      Family History  Problem Relation Age of Onset   Heart disease Neg Hx     Vitals:   10/27/21 0958  BP: (!) 160/90  Pulse: 67  SpO2: 96%  Weight: 117.7 kg (259 lb 6.4 oz)    PHYSICAL EXAM: General:  Well appearing. No respiratory difficulty HEENT: normal Neck: supple. no JVD. Carotids 2+ bilat; no bruits. No lymphadenopathy or thryomegaly appreciated. Cor: PMI nondisplaced. Regular rate & rhythm. No rubs, gallops or murmurs. Lungs: clear Abdomen:  soft, nontender, nondistended. No hepatosplenomegaly. No bruits or masses. Good bowel sounds. Extremities: no cyanosis, clubbing, rash, edema Neuro: alert & oriented x 3, cranial nerves grossly intact. moves all 4 extremities w/o difficulty. Affect pleasant.  ECG: Not performed    ASSESSMENT & PLAN:  Systolic Heart Failure -  Echo 1/23: EF 30 to 35%, mod LVH w/ GIIDD, normal RV (no prior study for comparison) -  Etiology uncertain. 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 Also ? IHD. He will need cath if renal fx improves (chest CT + coronary calcifications, multiple RFs for IHD but h/o ischemic like CP). ETOH and cocaine may also contribute - NYHA Class II. Appears grossly euvolemic on exam but wt up post discharge. Body habitus not favorable for ReDs assessment - Continue Lasix 40 mg daily and check BNP today (titrate loop diuretic further if elevated)   - No ARNi/ABR/Spiro/ dig w/ recent SCr > 2.0 - Continue Imdur 60 - Increase hydralazine to 75 mg tid - Increase Coreg to 12.5 mg bid  - Start Farxiga 10 mg daily. 30 day co-pay card given. HF pharmD to help check qualification for assistance.  - Encouraged avoidance of cocaine  and ETOH  - Refer to the [facility]    2. Hypertension  - long h/o poor control - moderately elevated today  - increase coreg to 12.5 mg bid - increase hydralazine to 75 mg tid - h/o snoring. Will need sleep study to r/o OSA   3. Renal Insuffiencey  - recent hospitalization w/ SCr elevated at 2.2 - unsure if acute vs chronic (had not been followed PTA)  - repeat BMP today  - ? Hypertensive nephropathy. BP control per above  - Star Farxiga 10 mg daily (GFR 33)   4. Thoracic aortic aneurysm - aortic root measured 4.7 to 4.9 cm on noncontrast chest CT 1/23 - needs repeat imaging in 3 months  - refer to outpatient CT surgery  - tight BP control - on ? blocker - add statin   NYHA II GDMT  Diuretic- Lasix 40 mg daily  BB- Coreg 12.5 mg bid  Ace/ARB/ARNI No CKD, SCr > 2.0  MRA No CKD, SCr > 2.0  SGLT2i- Farxiga 10 mg daily     Referred to HFSW (PCP, Medications, Transportation, ETOH Abuse, Drug Abuse, Insurance, Museum/gallery curator ): Yes  Refer to Pharmacy: Yes  Refer to Home Health: No Refer to Advanced Heart Failure Clinic: Yes  Refer to General Cardiology: No  Follow up  in the [facility] in 2-3 weeks

## 2021-10-27 ENCOUNTER — Ambulatory Visit (HOSPITAL_COMMUNITY)
Admission: RE | Admit: 2021-10-27 | Discharge: 2021-10-27 | Disposition: A | Discharge status: Home / Self Care | Payer: Medicare Other | Source: Ambulatory Visit | Attending: Cardiology | Admitting: Cardiology

## 2021-10-27 ENCOUNTER — Encounter (HOSPITAL_COMMUNITY): Payer: Self-pay

## 2021-10-27 ENCOUNTER — Other Ambulatory Visit: Payer: Self-pay

## 2021-10-27 ENCOUNTER — Other Ambulatory Visit (HOSPITAL_COMMUNITY): Payer: Self-pay

## 2021-10-27 VITALS — BP 160/90 | HR 67 | Wt 259.4 lb

## 2021-10-27 DIAGNOSIS — I712 Thoracic aortic aneurysm, without rupture, unspecified: Secondary | ICD-10-CM | POA: Diagnosis not present

## 2021-10-27 DIAGNOSIS — F1491 Cocaine use, unspecified, in remission: Secondary | ICD-10-CM | POA: Diagnosis not present

## 2021-10-27 DIAGNOSIS — R0683 Snoring: Secondary | ICD-10-CM | POA: Diagnosis not present

## 2021-10-27 DIAGNOSIS — I7121 Aneurysm of the ascending aorta, without rupture: Secondary | ICD-10-CM | POA: Diagnosis not present

## 2021-10-27 DIAGNOSIS — I5020 Unspecified systolic (congestive) heart failure: Secondary | ICD-10-CM | POA: Diagnosis not present

## 2021-10-27 DIAGNOSIS — Z79899 Other long term (current) drug therapy: Secondary | ICD-10-CM | POA: Insufficient documentation

## 2021-10-27 DIAGNOSIS — I080 Rheumatic disorders of both mitral and aortic valves: Secondary | ICD-10-CM | POA: Diagnosis not present

## 2021-10-27 DIAGNOSIS — N289 Disorder of kidney and ureter, unspecified: Secondary | ICD-10-CM | POA: Insufficient documentation

## 2021-10-27 DIAGNOSIS — F1091 Alcohol use, unspecified, in remission: Secondary | ICD-10-CM | POA: Diagnosis not present

## 2021-10-27 DIAGNOSIS — I110 Hypertensive heart disease with heart failure: Secondary | ICD-10-CM | POA: Insufficient documentation

## 2021-10-27 DIAGNOSIS — Z7984 Long term (current) use of oral hypoglycemic drugs: Secondary | ICD-10-CM | POA: Insufficient documentation

## 2021-10-27 DIAGNOSIS — I2510 Atherosclerotic heart disease of native coronary artery without angina pectoris: Secondary | ICD-10-CM | POA: Insufficient documentation

## 2021-10-27 DIAGNOSIS — Z87891 Personal history of nicotine dependence: Secondary | ICD-10-CM | POA: Insufficient documentation

## 2021-10-27 LAB — BASIC METABOLIC PANEL
Anion gap: 6 (ref 5–15)
BUN: 31 mg/dL — ABNORMAL HIGH (ref 8–23)
CO2: 27 mmol/L (ref 22–32)
Calcium: 9.1 mg/dL (ref 8.9–10.3)
Chloride: 104 mmol/L (ref 98–111)
Creatinine, Ser: 1.93 mg/dL — ABNORMAL HIGH (ref 0.61–1.24)
GFR, Estimated: 38 mL/min — ABNORMAL LOW (ref >60)
Glucose, Bld: 105 mg/dL — ABNORMAL HIGH (ref 70–99)
Potassium: 4 mmol/L (ref 3.5–5.1)
Sodium: 137 mmol/L (ref 135–145)

## 2021-10-27 LAB — BRAIN NATRIURETIC PEPTIDE: B Natriuretic Peptide: 118.4 pg/mL — ABNORMAL HIGH (ref 0.0–100.0)

## 2021-10-27 MED ORDER — DAPAGLIFLOZIN PROPANEDIOL 10 MG PO TABS
10.0000 mg | ORAL_TABLET | Freq: Every day | ORAL | 3 refills | Status: DC
  Filled 2021-10-27: qty 30, 30d supply, fill #0
  Filled 2021-11-24: qty 30, 30d supply, fill #1
  Filled 2021-12-23: qty 30, 30d supply, fill #2
  Filled 2022-02-06: qty 30, 30d supply, fill #3

## 2021-10-27 MED ORDER — HYDRALAZINE HCL 50 MG PO TABS
75.0000 mg | ORAL_TABLET | Freq: Three times a day (TID) | ORAL | 3 refills | Status: DC
  Filled 2021-10-27 – 2021-11-24 (×2): qty 135, 30d supply, fill #0
  Filled 2021-12-23: qty 135, 30d supply, fill #1
  Filled 2022-02-06: qty 135, 30d supply, fill #2
  Filled 2022-04-12: qty 135, 30d supply, fill #3

## 2021-10-27 MED ORDER — ISOSORBIDE MONONITRATE ER 60 MG PO TB24
60.0000 mg | ORAL_TABLET | Freq: Every day | ORAL | 3 refills | Status: DC
  Filled 2021-10-27 – 2021-11-24 (×2): qty 30, 30d supply, fill #0
  Filled 2021-12-23: qty 30, 30d supply, fill #1
  Filled 2022-02-06: qty 30, 30d supply, fill #2
  Filled 2022-04-12: qty 30, 30d supply, fill #3

## 2021-10-27 MED ORDER — FUROSEMIDE 40 MG PO TABS
40.0000 mg | ORAL_TABLET | Freq: Every day | ORAL | 3 refills | Status: DC
  Filled 2021-10-27 – 2021-11-24 (×2): qty 30, 30d supply, fill #0
  Filled 2021-12-23: qty 30, 30d supply, fill #1
  Filled 2022-02-06: qty 30, 30d supply, fill #2
  Filled 2022-04-12: qty 30, 30d supply, fill #3

## 2021-10-27 MED ORDER — CARVEDILOL 12.5 MG PO TABS
12.5000 mg | ORAL_TABLET | Freq: Two times a day (BID) | ORAL | 3 refills | Status: DC
Start: 2021-10-27 — End: 2021-11-24
  Filled 2021-10-27: qty 60, 30d supply, fill #0

## 2021-10-27 NOTE — Patient Instructions (Signed)
Increase Carvedilol to 12.5 mg Twice daily   Increase Hydralazine to 75 mg (1 & 1/2 tabs) Three times a day   **PRESCRIPTIONS FOR ALL MEDICATIONS HAS BEEN SENT TO THE [facility] UNDER THE HF FUND  Labs done today, your results will be available in MyChart, we will contact you for abnormal readings.  You have been referred to Cardiothoracic surgeons, they will call you for an appointment  Thank you for allowing [place] to provider your heart failure care after your recent hospitalization. Please follow-up with our [facility] in: 2-3 weeks and again in 6-8 weeks  Do the following things EVERYDAY: Weigh yourself in the morning before breakfast. Write it down and keep it in a log. Take your medicines as prescribed Eat low salt foods--Limit salt (sodium) to 2000 mg per day.  Stay as active as you can everyday Limit all fluids for the day to less than 2 liters

## 2021-10-27 NOTE — Progress Notes (Signed)
Heart and Vascular Care Navigation  10/27/2021  Timothy Moon 1956/10/01 [id number]  Reason for Referral: Patient seen in HF [facility] clinic.   Engaged with patient face to face for initial visit for Heart and Vascular Care Coordination.                                                                                                   Assessment:  Patient is a 66 yo male who resides with a male friend Amy and her 53 yo son. He is currently receiving his social security benefits and reports he signed up for Medicare A and B but did not sign up for Medicare D.  Patient states that he is trying to quit smoking tobacco and "almost quit". Patient states he had one cigarette yesterday and reports he "can have a strong willpower"  and denies any smoking cessation. He admits to using cocaine in the past and currently uses 1-2 x monthly. He states that he went to rehab in 2012 when he left [place]. He spoke of his triggers and stated that "a bad relationship" contributed to his relapse a few years ago. He states he has been sober for 11 years but not clean from drugs. He shared that he recently ran into an old friend who he was a sponsor through [employer] and has regained contact with this person who is know acting as his sponsor. Patient shared the value of AA and the support he gains for involvement.                             HRT/VAS Care Coordination     Outpatient Care Team Social Worker   Social Worker Name: Lasandra Beech, [place] [phone]   Living arrangements for the past 2 months Single Family Home   Lives with: Significant Other; Other (Comment)  Amy- SO and 9 yo son   Patient Current Insurance Coverage Traditional Medicare   Patient Has Concern With Paying Medical Bills No   Does Patient Have Prescription Coverage? No   Patient Prescription Assistance Programs Heart Failure Fund  Will call [employer] to enroll in Medicare D   Home Assistive Devices/Equipment Scales       Social History:                                                                              SDOH Screenings   Alcohol Screen: Low Risk    Last Alcohol Screening Score (AUDIT): 0  Depression (PHQ2-9): Not on file  Financial Resource Strain: Medium Risk   Difficulty of Paying Living Expenses: Somewhat hard  Food Insecurity: No Food Insecurity   Worried About Running Out of Food in the Last Year: Never true   Ran Out of Food in the Last  Year: Never true  Housing: Low Risk    Last Housing Risk Score: 0  Physical Activity: Not on file  Social Connections: Not on file  Stress: Not on file  Tobacco Use: Medium Risk   Smoking Tobacco Use: Former   Smokeless Tobacco Use: Never   Passive Exposure: Not on file  Transportation Needs: No Transportation Needs   Lack of Transportation (Medical): No   Lack of Transportation (Non-Medical): No    SDOH Interventions: Financial Resources:  Financial Strain Interventions: Other (Comment) Will contact [employer] to enroll in Medicare D.  Food Insecurity:   N/a  Housing Insecurity:   N/a  Transportation:    N/a   Health Promotion Interventions:     Physical Inactivity N/a  Smoking Cessation Patient denied need  Dietary Concerns N/a  Health Coaching Patient denied need at this time   Other Care Navigation Interventions:     Inpatient/Outpatient Substance Abuse Counseling/Rehab Options CSW discussed resources although patient feels he has the support needed at this time  Provided Pharmacy assistance resources Heart Failure Fund (Will call [employer] to enroll in Medicare D)  Patient expressed Mental Health concerns No.  Patient Referred to: N/a   Follow-up plan:  Patient will follow up with [facility] and hoping that he is still within the enrollment window as he only turned 65 less than 3 months ago. Patient verbalizes awareness of substance use and resources available to him. He denies any needs at this time and will follow up in clinic if he feels that he may need  services/assistance in the future. CSW provided supportive intervention and resources and will be available as needed. Lasandra Beech, LCSW, CCSW-MCS [phone]

## 2021-10-29 ENCOUNTER — Encounter: Payer: Self-pay | Admitting: Cardiology

## 2021-11-10 NOTE — Progress Notes (Incomplete)
ADVANCED HF CLINIC CONSULT NOTE  Referring Physician: Lyda Jester PA Primary Care: Primary Cardiologist:  HPI: [name] is a 66 y/o male w/ history of poorly controlled HTN, ETOH use, tobacco use and cocaine use, and recently diagnosed with HFrEF.   Admitted to [facility] 1/23 with Acute HFrEF.  Echo showed moderately reduced LVEF 30 to 35%, mod LVH w/ GIIDD, normal RV. Chest CT also showed thoracic aortic aneurysm. Aortic root measures 4.7 to 4.9 cm. Coronary atherosclerosis also noted. UDS negative. Diuresed w/ IV Lasix. Did not undergo LHC given renal insufficiency, which also limited medical therapy. SCr 2.2 (baseline unknown). No ARNi/ARB/spiro/dig. Placed on Imdur/hydarl, coreg and farxiga. D/c wt 249 lb.   He was seen in the HF TOC last week and referred to the Advance Heart Failure Clinic. Hypertensive at the visit. Coreg and hydralazine increased. Also started on farxiga.   Overall feeling fine. Denies SOB/PND/Orthopnea. Appetite ok. No fever or chills. Weight at home pounds. Taking all medications      Cardiac Testing   2D Echo 10/19/21 EF 30-35%. Global HK, moderate ventricular hypertrophy, grade II DD, RV normal Aneurysm of the ascending aorta, measuring 43 mm.      Review of Systems: [y] = yes, [ ]  = no   General: Weight gain [ ] ; Weight loss [ ] ; Anorexia [ ] ; Fatigue [ ] ; Fever [ ] ; Chills [ ] ; Weakness [ ]   Cardiac: Chest pain/pressure [ ] ; Resting SOB [ ] ; Exertional SOB [ ] ; Orthopnea [ ] ; Pedal Edema [ ] ; Palpitations [ ] ; Syncope [ ] ; Presyncope [ ] ; Paroxysmal nocturnal dyspnea[ ]   Pulmonary: Cough [ ] ; Wheezing[ ] ; Hemoptysis[ ] ; Sputum [ ] ; Snoring [ ]   GI: Vomiting[ ] ; Dysphagia[ ] ; Melena[ ] ; Hematochezia [ ] ; Heartburn[ ] ; Abdominal pain [ ] ; Constipation [ ] ; Diarrhea [ ] ; BRBPR [ ]   GU: Hematuria[ ] ; Dysuria [ ] ; Nocturia[ ]   Vascular: Pain in legs with walking [ ] ; Pain in feet with lying flat [ ] ; Non-healing sores [ ] ; Stroke [ ] ; TIA [ ] ; Slurred  speech [ ] ;  Neuro: Headaches[ ] ; Vertigo[ ] ; Seizures[ ] ; Paresthesias[ ] ;Blurred vision [ ] ; Diplopia [ ] ; Vision changes [ ]   Ortho/Skin: Arthritis [ ] ; Joint pain [ ] ; Muscle pain [ ] ; Joint swelling [ ] ; Back Pain [ ] ; Rash [ ]   Psych: Depression[ ] ; Anxiety[ ]   Heme: Bleeding problems [ ] ; Clotting disorders [ ] ; Anemia [ ]   Endocrine: Diabetes [ ] ; Thyroid dysfunction[ ]    Past Medical History:  Diagnosis Date   Essential hypertension 10/19/2021    Current Outpatient Medications  Medication Sig Dispense Refill   carvedilol (COREG) 12.5 MG tablet Take 1 tablet (12.5 mg total) by mouth 2 (two) times daily with a meal. 60 tablet 3   dapagliflozin propanediol (FARXIGA) 10 MG TABS tablet Take 1 tablet (10 mg total) by mouth daily. 30 tablet 3   furosemide (LASIX) 40 MG tablet Take 1 tablet (40 mg total) by mouth daily. 30 tablet 3   hydrALAZINE (APRESOLINE) 50 MG tablet Take 1.5 tablets (75 mg total) by mouth every 8 (eight) hours. 135 tablet 3   isosorbide mononitrate (IMDUR) 60 MG 24 hr tablet Take 1 tablet (60 mg total) by mouth daily. 30 tablet 3   nitroGLYCERIN (NITROSTAT) 0.4 MG SL tablet Place 1 tablet (0.4 mg total) under the tongue every 5 (five) minutes as needed for chest pain. 30 tablet 12   No current facility-administered medications for this  visit.    No Known Allergies    Social History   Socioeconomic History   Marital status: Divorced    Spouse name: Not on file   Number of children: 2   Years of education: Not on file   Highest education level: Associate degree: academic program  Occupational History   Occupation: retired  Tobacco Use   Smoking status: Former    Packs/day: 1.00    Years: 20.00    Pack years: 20.00    Types: Cigarettes    Quit date: 10/17/2021    Years since quitting: 0.0   Smokeless tobacco: Never  Vaping Use   Vaping Use: Never used  Substance and Sexual Activity   Alcohol use: Not Currently    Comment: heavy drinker x 7 years,  quit 11 years ago.   Drug use: Not Currently    Types: Cocaine    Comment: 1-2x/mo last use 10/09/2021   Sexual activity: Not on file  Other Topics Concern   Not on file  Social History Narrative   Not on file   Social Determinants of Health   Financial Resource Strain: Medium Risk   Difficulty of Paying Living Expenses: Somewhat hard  Food Insecurity: No Food Insecurity   Worried About Running Out of Food in the Last Year: Never true   Ran Out of Food in the Last Year: Never true  Transportation Needs: No Transportation Needs   Lack of Transportation (Medical): No   Lack of Transportation (Non-Medical): No  Physical Activity: Not on file  Stress: Not on file  Social Connections: Not on file  Intimate Partner Violence: Not on file      Family History  Problem Relation Age of Onset   Heart disease Neg Hx     There were no vitals filed for this visit.  PHYSICAL EXAM: General:  Well appearing. No respiratory difficulty HEENT: normal Neck: supple. no JVD. Carotids 2+ bilat; no bruits. No lymphadenopathy or thryomegaly appreciated. Cor: PMI nondisplaced. Regular rate & rhythm. No rubs, gallops or murmurs. Lungs: clear Abdomen: soft, nontender, nondistended. No hepatosplenomegaly. No bruits or masses. Good bowel sounds. Extremities: no cyanosis, clubbing, rash, edema Neuro: alert & oriented x 3, cranial nerves grossly intact. moves all 4 extremities w/o difficulty. Affect pleasant.  ECG:   ASSESSMENT & PLAN: Systolic Heart Failure -  Echo 1/23: EF 30 to 35%, mod LVH w/ GIIDD, normal RV (no prior study for comparison) -  Etiology uncertain. 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 He will need cath if renal fx improves (chest CT + coronary calcifications, multiple RFs, ETOH and cocaine may also contribute - NYHA - Continue Lasix 40 mg daily  - No ARNi/ABR/Spiro/ dig w/ recent SCr > 2.0 - Continue Imdur 60 - Continue hydralazine to 75 mg tid -  Continue Coreg to 12.5 mg bid  - Start Farxiga 10 mg daily. 30 day co-pay card given. -     2. Hypertension  - long h/o poor control -- h/o snoring.  -Will need sleep study to r/o OSA    3. Renal Insuffiencey  - recent hospitalization w/ SCr elevated at 2.2 - ? Hypertensive nephropathy. BP control per above  - Continue Farxiga 10 mg daily - BMET    4. Thoracic aortic aneurysm - aortic root measured 4.7 to 4.9 cm on noncontrast chest CT 1/23 - needs repeat imaging in 3 months  - refer to outpatient CT surgery  - tight BP control - on ?  blocker - add statin

## 2021-11-11 ENCOUNTER — Encounter (HOSPITAL_COMMUNITY): Payer: Medicare Other

## 2021-11-23 ENCOUNTER — Other Ambulatory Visit: Payer: Self-pay

## 2021-11-23 ENCOUNTER — Encounter: Payer: Self-pay | Admitting: Thoracic Surgery (Cardiothoracic Vascular Surgery)

## 2021-11-23 ENCOUNTER — Institutional Professional Consult (permissible substitution) (INDEPENDENT_AMBULATORY_CARE_PROVIDER_SITE_OTHER): Payer: Medicare Other | Admitting: Thoracic Surgery (Cardiothoracic Vascular Surgery)

## 2021-11-23 VITALS — BP 120/78 | HR 56 | Resp 20 | Ht 75.0 in | Wt 259.0 lb

## 2021-11-23 DIAGNOSIS — I7121 Aneurysm of the ascending aorta, without rupture: Secondary | ICD-10-CM | POA: Diagnosis not present

## 2021-11-23 HISTORY — DX: Aneurysm of the ascending aorta, without rupture: I71.21

## 2021-11-23 NOTE — Progress Notes (Signed)
PCP is Pcp, No Referring Provider is Lawernce Ion*  Chief Complaint  Patient presents with   Thoracic Aortic Aneurysm    Surgical consult, Chest CT 10/20/21, ECHO 10/19/21     HPI: Mr. Timothy Moon is sent for consultation regarding an ascending aortic aneurysm.  Timothy Moon is a 66 year old man with a history of hypertension, tobacco abuse, stage III chronic kidney disease, congestive heart failure, and recently diagnosed ascending aortic aneurysm.  He has been having some issues with shortness of breath and swelling in his legs for a couple of years but had not sought medical attention.  In January he was hospitalized with decompensated acute congestive heart failure.  While in the hospital he had a CT of the chest which showed a 4.9 cm ascending aneurysm.  That was done without contrast due to his renal disease.  His troponins were mildly elevated, but he did not undergo a an ischemic work-up.  He since has had a couple of episodes of chest pain one about 2 weeks ago.  Resolved quickly with rest.  Shortness of breath and leg swelling resolved with treatment and have not returned since discharge.  He did quit smoking in January.   Past Medical History:  Diagnosis Date   Acute congestive heart failure ([place]) 10/19/2021   Ascending aortic aneurysm 11/23/2021   4.9 cm in January 2023   Chronic kidney disease, stage 3a ([place]) 10/19/2021   Cocaine abuse ([place]) 10/19/2021   Essential hypertension 10/19/2021   Nicotine dependence, cigarettes, uncomplicated [id number]    No past surgical history on file.  Family History  Problem Relation Age of Onset   Heart disease Neg Hx     Social History Social History   Tobacco Use   Smoking status: Former    Packs/day: 1.00    Years: 20.00    Pack years: 20.00    Types: Cigarettes    Quit date: 10/17/2021    Years since quitting: 0.1   Smokeless tobacco: Never  Vaping Use   Vaping Use: Never used  Substance Use Topics   Alcohol use: Not Currently     Comment: heavy drinker x 7 years, quit 11 years ago.   Drug use: Not Currently    Types: Cocaine    Comment: 1-2x/mo last use 10/09/2021    Current Outpatient Medications  Medication Sig Dispense Refill   carvedilol (COREG) 12.5 MG tablet Take 1 tablet (12.5 mg total) by mouth 2 (two) times daily with a meal. 60 tablet 3   dapagliflozin propanediol (FARXIGA) 10 MG TABS tablet Take 1 tablet (10 mg total) by mouth daily. 30 tablet 3   furosemide (LASIX) 40 MG tablet Take 1 tablet (40 mg total) by mouth daily. 30 tablet 3   hydrALAZINE (APRESOLINE) 50 MG tablet Take 1.5 tablets (75 mg total) by mouth every 8 (eight) hours. 135 tablet 3   isosorbide mononitrate (IMDUR) 60 MG 24 hr tablet Take 1 tablet (60 mg total) by mouth daily. 30 tablet 3   nitroGLYCERIN (NITROSTAT) 0.4 MG SL tablet Place 1 tablet (0.4 mg total) under the tongue every 5 (five) minutes as needed for chest pain. 30 tablet 12   No current facility-administered medications for this visit.    No Known Allergies  Review of Systems  Constitutional:  Negative for activity change and unexpected weight change.  HENT:  Negative for trouble swallowing and voice change.   Respiratory:  Positive for shortness of breath (Resolved).   Cardiovascular:  Positive for chest  pain and leg swelling (Resolved).  Gastrointestinal:  Negative for abdominal distention and abdominal pain.  Genitourinary:  Negative for difficulty urinating and dysuria.  Musculoskeletal:  Negative for arthralgias and myalgias.  Neurological:  Negative for syncope and weakness.  Hematological:  Negative for adenopathy. Does not bruise/bleed easily.  All other systems reviewed and are negative.  BP 120/78    Pulse (!) 56    Resp 20    Ht 6\' 3"  (1.905 m)    Wt 259 lb (117.5 kg)    SpO2 94% Comment: RA   BMI 32.37 kg/m  Physical Exam Vitals reviewed.  Constitutional:      Appearance: Normal appearance. He is not toxic-appearing.  HENT:     Head:  Normocephalic and atraumatic.  Eyes:     General: No scleral icterus.    Extraocular Movements: Extraocular movements intact.  Cardiovascular:     Rate and Rhythm: Normal rate and regular rhythm.     Heart sounds: Normal heart sounds. No murmur heard. Pulmonary:     Effort: Pulmonary effort is normal. No respiratory distress.     Breath sounds: Normal breath sounds. No wheezing or rales.  Abdominal:     General: There is no distension.     Palpations: Abdomen is soft.     Tenderness: There is no abdominal tenderness.  Musculoskeletal:     Cervical back: Neck supple.     Comments: No edema but mild chronic venous stasis changes and varicosities both lower extremities right greater than left  Lymphadenopathy:     Cervical: No cervical adenopathy.  Skin:    General: Skin is warm and dry.  Neurological:     General: No focal deficit present.     Mental Status: He is alert and oriented to person, place, and time.     Cranial Nerves: No cranial nerve deficit.     Motor: No weakness.    Diagnostic Tests: CT CHEST WITHOUT CONTRAST   TECHNIQUE: Multidetector CT imaging of the chest was performed following the standard protocol without IV contrast.   COMPARISON:  None.   FINDINGS: Cardiovascular: Calcified coronary artery atherosclerosis. Cardiac size is at the upper limits of normal. No pericardial effusion.   Fusiform enlargement of the ascending thoracic aorta measuring 47-49 mm maximal diameter (series 2, image 91, series 4, image 46 and series 5, image 92). The aorta tapers in the arch, and the descending segment does not appear aneurysmal. Mild Calcified aortic atherosclerosis. Vascular patency is not evaluated in the absence of IV contrast.   Mediastinum/Nodes: Negative. No mediastinal mass or lymphadenopathy.   Lungs/Pleura: Major airways are patent. Upper lobe predominant centrilobular emphysema is mild. Mild dependent atelectasis and trace layering pleural  effusions. Occasional subpleural lung scarring.   Upper Abdomen: Negative visible noncontrast liver, gallbladder, spleen, pancreas, adrenal glands and proximal bowel. Diverticulosis of the visible colon. Multiple bilateral renal cysts, have a benign appearance. Renal vascular calcifications at the hilum.   Musculoskeletal: Flowing anterior endplate osteophytes result in interbody ankylosis in the midthoracic spine. No acute osseous abnormality identified.   IMPRESSION: 1. 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 Recommend semi-annual imaging followup by CTA or MRA and referral to Cardiothoracic Surgery if not already obtained. This recommendation follows 2010 ACCF/AHA/AATS/ACR/ASA/SCA/SCAI/SIR/STS/SVM Guidelines for the Diagnosis and Management of Patients With Thoracic Aortic Disease. Circulation. 2010; [id number]. Aortic aneurysm NOS (ICD10-I71.9). Aortic Atherosclerosis (ICD10-I70.0).   2. Emphysema (ICD10-J43.9). Trace layering pleural effusions and mild pulmonary atelectasis.  Electronically Signed   By: Genevie Ann M.D.   On: 10/20/2021 11:50 I personally reviewed the CT images.  There is a 4.9 cm ascending aneurysm.  Impression: Timothy Moon is a 66 year old man with a history of hypertension, tobacco abuse, stage III chronic kidney disease, congestive heart failure, and recently diagnosed ascending aortic aneurysm.  Ascending aneurysm-first noted a month ago on CT.  4.9 cm.  No indication for surgery at this time.  I reviewed the films with him.  We discussed the reasoning behind the indications for surgery.  He understands the importance of blood pressure control and avoiding substances which increases blood pressure.  Hypertension-blood pressure well controlled on current regimen.  Tobacco abuse-quit smoking in January.  He did have 1 cigarette a couple of days ago.  I stressed the importance of abstinence to him.  He  understands the issues involved.  Plan: Return in 6 months with CT chest to follow-up ascending aneurysm  Melrose Nakayama, MD [facility] [phone]

## 2021-11-24 ENCOUNTER — Other Ambulatory Visit (HOSPITAL_COMMUNITY): Payer: Self-pay

## 2021-11-24 ENCOUNTER — Telehealth (HOSPITAL_COMMUNITY): Payer: Self-pay | Admitting: Pharmacy Technician

## 2021-11-24 ENCOUNTER — Encounter (HOSPITAL_COMMUNITY): Payer: Self-pay

## 2021-11-24 ENCOUNTER — Ambulatory Visit (HOSPITAL_COMMUNITY)
Admission: RE | Admit: 2021-11-24 | Discharge: 2021-11-24 | Disposition: A | Discharge status: Home / Self Care | Payer: Medicare Other | Source: Ambulatory Visit | Attending: Family Medicine | Admitting: Family Medicine

## 2021-11-24 VITALS — BP 120/60 | HR 51 | Ht 75.0 in | Wt 253.8 lb

## 2021-11-24 DIAGNOSIS — N1831 Chronic kidney disease, stage 3a: Secondary | ICD-10-CM | POA: Diagnosis not present

## 2021-11-24 DIAGNOSIS — Z87891 Personal history of nicotine dependence: Secondary | ICD-10-CM | POA: Insufficient documentation

## 2021-11-24 DIAGNOSIS — E669 Obesity, unspecified: Secondary | ICD-10-CM | POA: Diagnosis not present

## 2021-11-24 DIAGNOSIS — R739 Hyperglycemia, unspecified: Secondary | ICD-10-CM | POA: Insufficient documentation

## 2021-11-24 DIAGNOSIS — R0683 Snoring: Secondary | ICD-10-CM | POA: Diagnosis not present

## 2021-11-24 DIAGNOSIS — I130 Hypertensive heart and chronic kidney disease with heart failure and stage 1 through stage 4 chronic kidney disease, or unspecified chronic kidney disease: Secondary | ICD-10-CM | POA: Insufficient documentation

## 2021-11-24 DIAGNOSIS — I4891 Unspecified atrial fibrillation: Secondary | ICD-10-CM | POA: Insufficient documentation

## 2021-11-24 DIAGNOSIS — F1091 Alcohol use, unspecified, in remission: Secondary | ICD-10-CM | POA: Insufficient documentation

## 2021-11-24 DIAGNOSIS — Z7984 Long term (current) use of oral hypoglycemic drugs: Secondary | ICD-10-CM | POA: Insufficient documentation

## 2021-11-24 DIAGNOSIS — I10 Essential (primary) hypertension: Secondary | ICD-10-CM

## 2021-11-24 DIAGNOSIS — I712 Thoracic aortic aneurysm, without rupture, unspecified: Secondary | ICD-10-CM | POA: Diagnosis not present

## 2021-11-24 DIAGNOSIS — I5022 Chronic systolic (congestive) heart failure: Secondary | ICD-10-CM | POA: Diagnosis not present

## 2021-11-24 DIAGNOSIS — I2510 Atherosclerotic heart disease of native coronary artery without angina pectoris: Secondary | ICD-10-CM | POA: Insufficient documentation

## 2021-11-24 DIAGNOSIS — F1411 Cocaine abuse, in remission: Secondary | ICD-10-CM | POA: Diagnosis not present

## 2021-11-24 DIAGNOSIS — I5020 Unspecified systolic (congestive) heart failure: Secondary | ICD-10-CM | POA: Diagnosis not present

## 2021-11-24 DIAGNOSIS — I7121 Aneurysm of the ascending aorta, without rupture: Secondary | ICD-10-CM | POA: Insufficient documentation

## 2021-11-24 DIAGNOSIS — F1410 Cocaine abuse, uncomplicated: Secondary | ICD-10-CM

## 2021-11-24 DIAGNOSIS — Z6831 Body mass index (BMI) 31.0-31.9, adult: Secondary | ICD-10-CM | POA: Insufficient documentation

## 2021-11-24 DIAGNOSIS — Z7901 Long term (current) use of anticoagulants: Secondary | ICD-10-CM | POA: Diagnosis not present

## 2021-11-24 DIAGNOSIS — Z79899 Other long term (current) drug therapy: Secondary | ICD-10-CM | POA: Insufficient documentation

## 2021-11-24 DIAGNOSIS — N289 Disorder of kidney and ureter, unspecified: Secondary | ICD-10-CM | POA: Insufficient documentation

## 2021-11-24 DIAGNOSIS — Z139 Encounter for screening, unspecified: Secondary | ICD-10-CM

## 2021-11-24 LAB — LIPID PANEL
Cholesterol: 104 mg/dL (ref 0–200)
HDL: 29 mg/dL — ABNORMAL LOW (ref >40)
LDL Cholesterol: 56 mg/dL (ref 0–99)
Total CHOL/HDL Ratio: 3.6 RATIO
Triglycerides: 93 mg/dL (ref <150)
VLDL: 19 mg/dL (ref 0–40)

## 2021-11-24 LAB — CBC
HCT: 43.7 % (ref 39.0–52.0)
Hemoglobin: 14.3 g/dL (ref 13.0–17.0)
MCH: 27.7 pg (ref 26.0–34.0)
MCHC: 32.7 g/dL (ref 30.0–36.0)
MCV: 84.5 fL (ref 80.0–100.0)
Platelets: 230 10*3/uL (ref 150–400)
RBC: 5.17 MIL/uL (ref 4.22–5.81)
RDW: 13.9 % (ref 11.5–15.5)
WBC: 8.6 10*3/uL (ref 4.0–10.5)
nRBC: 0 % (ref 0.0–0.2)

## 2021-11-24 LAB — COMPREHENSIVE METABOLIC PANEL
ALT: 37 U/L (ref 0–44)
AST: 25 U/L (ref 15–41)
Albumin: 3.7 g/dL (ref 3.5–5.0)
Alkaline Phosphatase: 66 U/L (ref 38–126)
Anion gap: 9 (ref 5–15)
BUN: 32 mg/dL — ABNORMAL HIGH (ref 8–23)
CO2: 23 mmol/L (ref 22–32)
Calcium: 9.2 mg/dL (ref 8.9–10.3)
Chloride: 110 mmol/L (ref 98–111)
Creatinine, Ser: 2 mg/dL — ABNORMAL HIGH (ref 0.61–1.24)
GFR, Estimated: 36 mL/min — ABNORMAL LOW (ref >60)
Glucose, Bld: 110 mg/dL — ABNORMAL HIGH (ref 70–99)
Potassium: 4.1 mmol/L (ref 3.5–5.1)
Sodium: 142 mmol/L (ref 135–145)
Total Bilirubin: 0.7 mg/dL (ref 0.3–1.2)
Total Protein: 7.1 g/dL (ref 6.5–8.1)

## 2021-11-24 LAB — BRAIN NATRIURETIC PEPTIDE: B Natriuretic Peptide: 528.5 pg/mL — ABNORMAL HIGH (ref 0.0–100.0)

## 2021-11-24 LAB — HEMOGLOBIN A1C
Hgb A1c MFr Bld: 5.9 % — ABNORMAL HIGH (ref 4.8–5.6)
Mean Plasma Glucose: 122.63 mg/dL

## 2021-11-24 LAB — TSH: TSH: 0.912 u[IU]/mL (ref 0.350–4.500)

## 2021-11-24 MED ORDER — APIXABAN 5 MG PO TABS
5.0000 mg | ORAL_TABLET | Freq: Two times a day (BID) | ORAL | 11 refills | Status: DC
Start: 2021-11-24 — End: 2022-06-08
  Filled 2021-11-24 (×2): qty 60, 30d supply, fill #0
  Filled 2021-12-23 – 2022-04-12 (×4): qty 60, 30d supply, fill #1
  Filled 2022-05-07 – 2022-06-08 (×3): qty 60, 30d supply, fill #2

## 2021-11-24 MED ORDER — CARVEDILOL 12.5 MG PO TABS
18.7500 mg | ORAL_TABLET | Freq: Two times a day (BID) | ORAL | 3 refills | Status: DC
  Filled 2021-11-24: qty 90, 30d supply, fill #0
  Filled 2021-12-23: qty 90, 30d supply, fill #1
  Filled 2022-02-06: qty 90, 30d supply, fill #2
  Filled 2022-04-12: qty 90, 30d supply, fill #3

## 2021-11-24 NOTE — Patient Instructions (Signed)
START Eliquis 5 mg, one tab twice a day INCREASE Coreg 18.75 mg, one and one half tab twice a day  Labs today We will only contact you if something comes back abnormal or we need to make some changes. Otherwise no news is good news!  Your provider has recommended that you have a home sleep study.  We have provided you with the equipment in our office today. Please download the app and follow the instructions. YOUR PIN NUMBER IS: [id number]. Once you have completed the test you just dispose of the equipment, the information is automatically uploaded to [place] via blue-tooth technology. If your test is positive for sleep apnea and you need a home CPAP machine you will be contacted by Dr Theodosia Blender office [facility]) to set this up.  You have been referred to [facility] -they will be in contact with an appointment  You have been referred to [facility]  -they will be in contact with an appointment  Keep follow up as scheduled with cardiology  Do the following things EVERYDAY: Weigh yourself in the morning before breakfast. Write it down and keep it in a log. Take your medicines as prescribed Eat low salt foods--Limit salt (sodium) to 2000 mg per day.  Stay as active as you can everyday Limit all fluids for the day to less than 2 liters  At the [facility], you and your health needs are our priority. As part of our continuing mission to provide you with exceptional heart care, we have created designated Provider Care Teams. These Care Teams include your primary Cardiologist (physician) and Advanced Practice Providers (APPs- Physician Assistants and Nurse Practitioners) who all work together to provide you with the care you need, when you need it.   You may see any of the following providers on your designated Care Team at your next follow up: Dr Glori Bickers Dr Haynes Kerns, NP Lyda Jester, [place] [facility] [place], [place] Audry Riles, PharmD   Please be sure to bring in all your medications bottles to every appointment.

## 2021-11-24 NOTE — Addendum Note (Signed)
Encounter addended by: Jacklynn Ganong, FNP on: 11/24/2021 4:52 PM  Actions taken: Clinical Note Signed

## 2021-11-24 NOTE — Progress Notes (Signed)
.  verst  ReDS Vest / Clip - 11/24/21 1400       ReDS Vest / Clip   Station Marker D    Ruler Value 38    ReDS Value Range Low volume    ReDS Actual Value 31    Anatomical Comments sitting

## 2021-11-24 NOTE — Progress Notes (Addendum)
ADVANCED HF CLINIC CONSULT NOTE  Referring Physician: Lyda Jester PA Primary Care: Timothy Moon HF Cardiologist: Dr. Haroldine Moon  HPI: [name] is a 66 y.o. male w/ history of poorly controlled HTN, ETOH use, tobacco use and cocaine use, and recently diagnosed with HFrEF.   Admitted to [facility] 1/23 with Acute HFrEF.  Echo showed moderately reduced LVEF 30 to 35%, mod LVH w/ GIIDD, normal RV. Chest CT also showed thoracic aortic aneurysm. Aortic root measures 4.7 to 4.9 cm. Coronary atherosclerosis also noted. UDS negative. Diuresed w/ IV Lasix. Did not undergo LHC given renal insufficiency, which also limited medical therapy. SCr 2.2 (baseline unknown). No ARNi/ARB/spiro/dig. Placed on Imdur/hydarl, coreg and farxiga. D/c wt 249 lb.   He was seen in the HF [facility] 1/23 and referred to the [facility]. Hypertensive at the visit. Coreg and hydralazine increased. Also started on farxiga.   Today he presents to establish care with AHF clinic. Overall feeling fatigued and irritated that he has to take so much medication, when he previously never required meds. SOB with stairs, + bendopnea and orthopnea. Denies palpitations, CP, dizziness, edema, or abnormal bleeding. Appetite ok. No fever or chills. Weight at home 252 pounds. Taking all medications. No ETOH x 11 years, no longer smoking tobacco/cigs, last cocaine use 3 weeks ago. Friend says he snores.   Cardiac Testing   - Echo 10/19/21 EF 30-35%. Global HK, moderate ventricular hypertrophy, grade II DD, RV normal Aneurysm of the ascending aorta, measuring 43 mm.  Review of Systems: [y] = yes, [ ]  = no   General: Weight gain [ ] ; Weight loss [ ] ; Anorexia [ ] ; Fatigue Blue.Reese ]; Fever [ ] ; Chills [ ] ; Weakness [ ]   Cardiac: Chest pain/pressure [ ] ; Resting SOB [ ] ; Exertional SOB Blue.Reese ]; Orthopnea Blue.Reese ]; Pedal Edema [ ] ; Palpitations [ ] ; Syncope [ ] ; Presyncope [ ] ; Paroxysmal nocturnal dyspnea[ ]   Pulmonary: Cough [ ] ; Wheezing[ ] ;  Hemoptysis[ ] ; Sputum [ ] ; Snoring Blue.Reese ]  GI: Vomiting[ ] ; Dysphagia[ ] ; Melena[ ] ; Hematochezia [ ] ; Heartburn[ ] ; Abdominal pain [ ] ; Constipation [ ] ; Diarrhea [ ] ; BRBPR [ ]   GU: Hematuria[ ] ; Dysuria [ ] ; Nocturia[ ]   Vascular: Pain in legs with walking [ ] ; Pain in feet with lying flat [ ] ; Non-healing sores [ ] ; Stroke [ ] ; TIA [ ] ; Slurred speech [ ] ;  Neuro: Headaches[ ] ; Vertigo[ ] ; Seizures[ ] ; Paresthesias[ ] ;Blurred vision [ ] ; Diplopia [ ] ; Vision changes [ ]   Ortho/Skin: Arthritis [ ] ; Joint pain [ ] ; Muscle pain [ ] ; Joint swelling [ ] ; Back Pain [ ] ; Rash [ ]   Psych: Depression[ ] ; Anxiety[ ]   Heme: Bleeding problems [ ] ; Clotting disorders [ ] ; Anemia [ ]   Endocrine: Diabetes Blue.Reese ]; Thyroid dysfunction[ ]   Past Medical History:  Diagnosis Date   Acute congestive heart failure ([place]) 10/19/2021   Ascending aortic aneurysm 11/23/2021   4.9 cm in January 2023   Chronic kidney disease, stage 3a ([place]) 10/19/2021   Cocaine abuse ([place]) 10/19/2021   Essential hypertension 10/19/2021   Nicotine dependence, cigarettes, uncomplicated [id number]   Current Outpatient Medications  Medication Sig Dispense Refill   carvedilol (COREG) 12.5 MG tablet Take 1 tablet (12.5 mg total) by mouth 2 (two) times daily with a meal. 60 tablet 3   dapagliflozin propanediol (FARXIGA) 10 MG TABS tablet Take 1 tablet (10 mg total) by mouth daily. 30 tablet 3   furosemide (LASIX) 40  MG tablet Take 1 tablet (40 mg total) by mouth daily. 30 tablet 3   hydrALAZINE (APRESOLINE) 50 MG tablet Take 1.5 tablets (75 mg total) by mouth every 8 (eight) hours. 135 tablet 3   isosorbide mononitrate (IMDUR) 60 MG 24 hr tablet Take 1 tablet (60 mg total) by mouth daily. 30 tablet 3   nitroGLYCERIN (NITROSTAT) 0.4 MG SL tablet Place 1 tablet (0.4 mg total) under the tongue every 5 (five) minutes as needed for chest pain. 30 tablet 12   No current facility-administered medications for this encounter.   No Known Allergies  Social  History   Socioeconomic History   Marital status: Divorced    Spouse name: Not on file   Number of children: 2   Years of education: Not on file   Highest education level: Associate degree: academic program  Occupational History   Occupation: retired  Tobacco Use   Smoking status: Former    Packs/day: 1.00    Years: 20.00    Pack years: 20.00    Types: Cigarettes    Quit date: 10/17/2021    Years since quitting: 0.1   Smokeless tobacco: Never  Vaping Use   Vaping Use: Never used  Substance and Sexual Activity   Alcohol use: Not Currently    Comment: heavy drinker x 7 years, quit 11 years ago.   Drug use: Not Currently    Types: Cocaine    Comment: 1-2x/mo last use 10/09/2021   Sexual activity: Not on file  Other Topics Concern   Not on file  Social History Narrative   Not on file   Social Determinants of Health   Financial Resource Strain: Medium Risk   Difficulty of Paying Living Expenses: Somewhat hard  Food Insecurity: No Food Insecurity   Worried About Running Out of Food in the Last Year: Never true   Ran Out of Food in the Last Year: Never true  Transportation Needs: No Transportation Needs   Lack of Transportation (Medical): No   Lack of Transportation (Non-Medical): No  Physical Activity: Not on file  Stress: Not on file  Social Connections: Not on file  Intimate Partner Violence: Not on file   Family History  Problem Relation Age of Onset   Heart disease Neg Hx    BP 120/60    Pulse (!) 51    Ht 6\' 3"  (1.905 m)    Wt 115.1 kg (253 lb 12.8 oz)    SpO2 93%    BMI 31.72 kg/m   Wt Readings from Last 3 Encounters:  11/24/21 115.1 kg (253 lb 12.8 oz)  11/23/21 117.5 kg (259 lb)  10/27/21 117.7 kg (259 lb 6.4 oz)   PHYSICAL EXAM: General:  NAD. No resp difficulty HEENT: Normal Neck: Supple. No JVD. Carotids 2+ bilat; no bruits. No lymphadenopathy or thryomegaly appreciated. Cor: PMI nondisplaced. Irregular rate & rhythm. No rubs, gallops or  murmurs. Lungs: Clear Abdomen: Soft, nontender, nondistended. No hepatosplenomegaly. No bruits or masses. Good bowel sounds. Extremities: No cyanosis, clubbing, rash, edema Neuro: Alert & oriented x 3, cranial nerves grossly intact. Moves all 4 extremities w/o difficulty. Affect pleasant.  ECG: atrial fibrillation 92 bpm (personally reviewed).  ReDs: 31%  ASSESSMENT & PLAN: Chronic Systolic Heart Failure -  Echo 1/23: EF 30 to 35%, mod LVH w/ GIIDD, normal RV (no prior study for comparison) -  Etiology uncertain. 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 He will need cath if renal fx improves (chest CT +  coronary calcifications, multiple RFs, ETOH and cocaine may also contribute) - NYHA II- early III, fatigue seems to be main limiter. He is not volume overloaded on exam. - Increase carvedilol to 18.75 mg bid (see #2). - Continue Lasix 40 mg daily. - Continue Imdur 60 mg daily. - Continue hydralazine 75 mg tid - Continue Coreg 12.5 mg bid  - Continue Farxiga 10 mg daily.  - No ARNi/ARB/Spiro/dig w/ recent SCr > 2.0. - Labs today.  2. Atrial fibrillation, new - Denies palpitations. - Will hold off on starting amio as he is rate-controlled. HR 92. - Increase beta blocker as above. - No dig with elevated SCr. - CHA2DS2-VASc Score: at least 3  - Start Eliquis 5 mg bid. Pharmacy helping with application for patient assistance. - Check TSH, CBC - Refer to AF clinic for DCCV consideration. - Needs sleep study.   3. Hypertension  - Long h/o poor control. - BP better today. - Increase carvedilol as above.   4. Renal Insuffiencey  - Recent hospitalization w/ SCr elevated at 2.2 - ? Hypertensive nephropathy. BP control per above.  - Continue Farxiga 10 mg daily. - BMET today.   5. Thoracic aortic aneurysm - aortic root measured 4.7 to 4.9 cm on noncontrast chest CT 1/23. - Had follow up with Dr. Skipper Cliche, planning repeat CT chest in 6 months. - Tight BP  control - On ? blocker. - We discussed adding statin today. He is overwhelmed with his new medical conditions and meds, and wants to hold off. - Check lipids.  6. Cocaine abuse - Last used 3 weeks ago. - Advised complete cessation.  7. Snoring - Arrange home sleep study.  8. Obesity - Body mass index is 31.72 kg/m. - Trying to eat more heart healthy. - He is requesting dietician referral, will arrange.  9. SDOH - He is establishing with PCP soon. - He has Medicare, but no Part D. Wilder Glade thru HF fund and Eliquis patient assistance app started.  Keep follow up with Dr. Haroldine Moon as scheduled next month.  Allena Katz, FNP-BC 11/24/21

## 2021-11-24 NOTE — Telephone Encounter (Signed)
Advanced Heart Failure Patient Advocate Encounter  Patient was seen in clinic today and started on Eliquis.   The patient is currently uninsured. Started an application for BMS.   Will fax in once signatures are obtained.

## 2021-11-26 ENCOUNTER — Encounter (HOSPITAL_COMMUNITY): Payer: Self-pay | Admitting: Internal Medicine

## 2021-11-26 NOTE — Telephone Encounter (Signed)
Sent in application via fax.  Will follow up.  

## 2021-11-30 ENCOUNTER — Encounter (HOSPITAL_COMMUNITY): Payer: Self-pay | Admitting: Nurse Practitioner

## 2021-11-30 ENCOUNTER — Other Ambulatory Visit: Payer: Self-pay

## 2021-11-30 ENCOUNTER — Ambulatory Visit (HOSPITAL_COMMUNITY)
Admission: RE | Admit: 2021-11-30 | Discharge: 2021-11-30 | Disposition: A | Discharge status: Home / Self Care | Payer: Medicare Other | Source: Ambulatory Visit | Attending: Nurse Practitioner | Admitting: Nurse Practitioner

## 2021-11-30 VITALS — BP 180/80 | HR 106 | Ht 75.0 in | Wt 255.8 lb

## 2021-11-30 DIAGNOSIS — Z79899 Other long term (current) drug therapy: Secondary | ICD-10-CM | POA: Diagnosis not present

## 2021-11-30 DIAGNOSIS — F1091 Alcohol use, unspecified, in remission: Secondary | ICD-10-CM | POA: Insufficient documentation

## 2021-11-30 DIAGNOSIS — D6869 Other thrombophilia: Secondary | ICD-10-CM | POA: Diagnosis not present

## 2021-11-30 DIAGNOSIS — I130 Hypertensive heart and chronic kidney disease with heart failure and stage 1 through stage 4 chronic kidney disease, or unspecified chronic kidney disease: Secondary | ICD-10-CM | POA: Insufficient documentation

## 2021-11-30 DIAGNOSIS — I5022 Chronic systolic (congestive) heart failure: Secondary | ICD-10-CM | POA: Insufficient documentation

## 2021-11-30 DIAGNOSIS — I4819 Other persistent atrial fibrillation: Secondary | ICD-10-CM | POA: Insufficient documentation

## 2021-11-30 DIAGNOSIS — Z87891 Personal history of nicotine dependence: Secondary | ICD-10-CM | POA: Insufficient documentation

## 2021-11-30 DIAGNOSIS — N289 Disorder of kidney and ureter, unspecified: Secondary | ICD-10-CM | POA: Insufficient documentation

## 2021-11-30 DIAGNOSIS — I7121 Aneurysm of the ascending aorta, without rupture: Secondary | ICD-10-CM | POA: Insufficient documentation

## 2021-11-30 DIAGNOSIS — I2510 Atherosclerotic heart disease of native coronary artery without angina pectoris: Secondary | ICD-10-CM | POA: Diagnosis not present

## 2021-11-30 DIAGNOSIS — I5021 Acute systolic (congestive) heart failure: Secondary | ICD-10-CM | POA: Diagnosis not present

## 2021-11-30 DIAGNOSIS — F1411 Cocaine abuse, in remission: Secondary | ICD-10-CM | POA: Diagnosis not present

## 2021-11-30 DIAGNOSIS — Z7901 Long term (current) use of anticoagulants: Secondary | ICD-10-CM | POA: Diagnosis not present

## 2021-11-30 DIAGNOSIS — I4891 Unspecified atrial fibrillation: Secondary | ICD-10-CM

## 2021-11-30 DIAGNOSIS — N1831 Chronic kidney disease, stage 3a: Secondary | ICD-10-CM | POA: Diagnosis not present

## 2021-11-30 NOTE — Progress Notes (Signed)
Primary Care Physician: Pcp, No Referring Physician: Allena Katz, FNP   Timothy Moon is a 66 y.o. male with a h/o of poorly controlled HTN, ETOH use, tobacco use and cocaine use, and recently diagnosed with HFrEF.   Admitted to [facility] 1/23 with Acute HFrEF.  Echo showed moderately reduced LVEF 30 to 35%, mod LVH w/ GIIDD, normal RV. Chest CT also showed thoracic aortic aneurysm. Aortic root measures 4.7 to 4.9 cm. Coronary atherosclerosis also noted. UDS negative. Diuresed w/ IV Lasix. Did not undergo LHC given renal insufficiency, which also limited medical therapy. SCr 2.2 (baseline unknown). No ARNi/ARB/spiro/dig. Placed on Imdur/hydarl, coreg and farxiga. D/c wt 249 lb.   He was seen in the [facility] 1/23 and referred to the [facility]. Hypertensive at the visit. Coreg and hydralazine increased. Also started on farxiga. - Echo 10/19/21 EF 30-35%. Global HK, moderate ventricular hypertrophy, grade II DD, RV normal Aneurysm of the ascending aorta, measuring 43 mm.   He was seen by Allena Katz, FNP, 11/24/21 and was started on eliquis 5 mg bid and referred to afib clinic to discuss cardioversion. However, he states today that he has only been taking  eliquis 1x daily since 2/9 . His HR's at home in afib  are in the 60-80's.   He is here with his friend today. He states no recent cocaine. Denies alcohol use. He does not appear to be symptomatic with the afib. States compliance with BB. Recently increased.     Today, he denies symptoms of palpitations, chest pain, shortness of breath, orthopnea, PND, lower extremity edema, dizziness, presyncope, syncope, or neurologic sequela. The patient is tolerating medications without difficulties and is otherwise without complaint today.   Past Medical History:  Diagnosis Date   Acute congestive heart failure ([place]) 10/19/2021   Ascending aortic aneurysm 11/23/2021   4.9 cm in January 2023   Chronic kidney disease, stage 3a ([place]) 10/19/2021    Cocaine abuse ([place]) 10/19/2021   Essential hypertension 10/19/2021   Nicotine dependence, cigarettes, uncomplicated [id number]   No past surgical history on file.  Current Outpatient Medications  Medication Sig Dispense Refill   apixaban (ELIQUIS) 5 MG TABS tablet Take 1 tablet (5 mg total) by mouth 2 (two) times daily. 60 tablet 11   carvedilol (COREG) 12.5 MG tablet Take 1.5 tablets (18.75 mg total) by mouth 2 (two) times daily with a meal. 90 tablet 3   dapagliflozin propanediol (FARXIGA) 10 MG TABS tablet Take 1 tablet (10 mg total) by mouth daily. 30 tablet 3   furosemide (LASIX) 40 MG tablet Take 1 tablet (40 mg total) by mouth daily. 30 tablet 3   hydrALAZINE (APRESOLINE) 50 MG tablet Take 1.5 tablets (75 mg total) by mouth every 8 (eight) hours. 135 tablet 3   isosorbide mononitrate (IMDUR) 60 MG 24 hr tablet Take 1 tablet (60 mg total) by mouth daily. 30 tablet 3   Multiple Vitamin (MULTIVITAMIN) capsule Take 1 capsule by mouth as needed. Centrum Silver for men     nitroGLYCERIN (NITROSTAT) 0.4 MG SL tablet Place 1 tablet (0.4 mg total) under the tongue every 5 (five) minutes as needed for chest pain. 30 tablet 12   No current facility-administered medications for this encounter.    No Known Allergies  Social History   Socioeconomic History   Marital status: Divorced    Spouse name: Not on file   Number of children: 2   Years of education: Not on file   Highest  education level: Associate degree: academic program  Occupational History   Occupation: retired  Tobacco Use   Smoking status: Former    Packs/day: 1.00    Years: 20.00    Pack years: 20.00    Types: Cigarettes    Quit date: 10/17/2021    Years since quitting: 0.1   Smokeless tobacco: Never  Vaping Use   Vaping Use: Never used  Substance and Sexual Activity   Alcohol use: Not Currently    Comment: heavy drinker x 7 years, quit 11 years ago.   Drug use: Not Currently    Types: Cocaine    Comment: 1-2x/mo last  use 10/09/2021   Sexual activity: Not on file  Other Topics Concern   Not on file  Social History Narrative   Not on file   Social Determinants of Health   Financial Resource Strain: Medium Risk   Difficulty of Paying Living Expenses: Somewhat hard  Food Insecurity: No Food Insecurity   Worried About Running Out of Food in the Last Year: Never true   Ran Out of Food in the Last Year: Never true  Transportation Needs: No Transportation Needs   Lack of Transportation (Medical): No   Lack of Transportation (Non-Medical): No  Physical Activity: Not on file  Stress: Not on file  Social Connections: Not on file  Intimate Partner Violence: Not on file    Family History  Problem Relation Age of Onset   Heart disease Neg Hx     ROS- All systems are reviewed and negative except as per the HPI above  Physical Exam: Vitals:   11/30/21 1135  BP: (!) 180/80  Pulse: (!) 106  Weight: 116 kg  Height: 6\' 3"  (1.905 m)   Wt Readings from Last 3 Encounters:  11/30/21 116 kg  11/24/21 115.1 kg  11/23/21 117.5 kg    Labs: Lab Results  Component Value Date   NA 142 11/24/2021   K 4.1 11/24/2021   CL 110 11/24/2021   CO2 23 11/24/2021   GLUCOSE 110 (H) 11/24/2021   BUN 32 (H) 11/24/2021   CREATININE 2.00 (H) 11/24/2021   CALCIUM 9.2 11/24/2021   PHOS 3.7 10/20/2021   MG 2.5 (H) 10/20/2021   No results found for: INR Lab Results  Component Value Date   CHOL 104 11/24/2021   HDL 29 (L) 11/24/2021   LDLCALC 56 11/24/2021   TRIG 93 11/24/2021     GEN- The patient is well appearing, alert and oriented x 3 today.   Head- normocephalic, atraumatic Eyes-  Sclera clear, conjunctiva pink Ears- hearing intact Oropharynx- clear Neck- supple, no JVP Lymph- no cervical lymphadenopathy Lungs- Clear to ausculation bilaterally, normal work of breathing Heart- Regular rate and rhythm, no murmurs, rubs or gallops, PMI not laterally displaced GI- soft, NT, ND, + BS Extremities- no  clubbing, cyanosis, or edema MS- no significant deformity or atrophy Skin- no rash or lesion Psych- euthymic mood, full affect Neuro- strength and sensation are intact  EKG-Vent. rate 106 BPM PR interval * ms QRS duration 100 ms QT/QTcB 354/470 ms P-R-T axes * 63 143 Atrial fibrillation with rapid ventricular response Nonspecific T wave abnormality Abnormal ECG When compared with ECG of 24-Nov-2021 14:01, PREVIOUS ECG IS PRESENT    Assessment and Plan:  1. Persistent afib  General education re afib Encouraged to not use cocaine, he states not an issue, he used it more socially  Continue carvedilol 18.75 mg bid  States at home he is rate controlled  with v rates 60-80's   2. CHA2DS2VASc score of at least 3 Instructed that eliquis is a bid drug, has been  taking daily  Has to take correctly bid x 3 weeks befor I can cardiovert I will bring back in 2 weeks to get scheduled   3. CHF Per HF Continue BB  Renal function is limiting drugs that can be used for HF  4. Ascending aortic aneurysm Recently elevated bu Dr. Roxan Hockey He is following   Timothy Moon. Timothy Moon, [facility] [address] [place], [place] [address] [phone]

## 2021-12-09 ENCOUNTER — Other Ambulatory Visit: Payer: Self-pay

## 2021-12-09 ENCOUNTER — Telehealth (HOSPITAL_COMMUNITY): Payer: Self-pay | Admitting: Surgery

## 2021-12-09 ENCOUNTER — Ambulatory Visit (INDEPENDENT_AMBULATORY_CARE_PROVIDER_SITE_OTHER): Payer: Medicare Other | Admitting: Family

## 2021-12-09 ENCOUNTER — Encounter: Payer: Self-pay | Admitting: Family

## 2021-12-09 VITALS — BP 118/71 | HR 89 | Temp 98.3°F | Ht 75.0 in | Wt 255.4 lb

## 2021-12-09 DIAGNOSIS — I480 Paroxysmal atrial fibrillation: Secondary | ICD-10-CM | POA: Insufficient documentation

## 2021-12-09 DIAGNOSIS — I5021 Acute systolic (congestive) heart failure: Secondary | ICD-10-CM

## 2021-12-09 DIAGNOSIS — I7121 Aneurysm of the ascending aorta, without rupture: Secondary | ICD-10-CM | POA: Diagnosis not present

## 2021-12-09 DIAGNOSIS — Z23 Encounter for immunization: Secondary | ICD-10-CM

## 2021-12-09 DIAGNOSIS — F17210 Nicotine dependence, cigarettes, uncomplicated: Secondary | ICD-10-CM | POA: Diagnosis not present

## 2021-12-09 DIAGNOSIS — N1832 Chronic kidney disease, stage 3b: Secondary | ICD-10-CM

## 2021-12-09 DIAGNOSIS — I4891 Unspecified atrial fibrillation: Secondary | ICD-10-CM | POA: Diagnosis not present

## 2021-12-09 DIAGNOSIS — I10 Essential (primary) hypertension: Secondary | ICD-10-CM | POA: Diagnosis not present

## 2021-12-09 NOTE — Patient Instructions (Addendum)
Welcome to [facility]! It was a pleasure meeting you today.   Good luck with your continued cardiac recovery, and let us know if you have needs in the future!    PLEASE NOTE:  If you had any LAB tests please let us know if you have not heard back within a few days. You may see your results on MyChart before we have a chance to review them but we will give you a call once they are reviewed by [place]. If we ordered any REFERRALS today, please let us know if you have not heard from their office within the next week.  Let us know through MyChart if you are needing REFILLS, or have your pharmacy send [place] the request. You can also use MyChart to communicate with me or any office staff.  Please try these tips to maintain a healthy lifestyle:  Eat most of your calories during the day when you are active. Eliminate processed foods including packaged sweets (pies, cakes, cookies), reduce intake of potatoes, white bread, white pasta, and white rice. Look for whole grain options, oat flour or almond flour.  Each meal should contain half fruits/vegetables, one quarter protein, and one quarter carbs (no bigger than a computer mouse).  Cut down on sweet beverages. This includes juice, soda, and sweet tea. Also watch fruit intake, though this is a healthier sweet option, it still contains natural sugar! Limit to 3 servings daily.  Drink at least 1 glass of water with each meal and aim for at least 8 glasses per day  Exercise at least 150 minutes every week.

## 2021-12-09 NOTE — Assessment & Plan Note (Signed)
Chronic - Coreg, Hydralazine, under Cardiology care for now for refills

## 2021-12-09 NOTE — Assessment & Plan Note (Signed)
NEW - has been seen by Vascular

## 2021-12-09 NOTE — Assessment & Plan Note (Signed)
Chronic - 20 pack year hx, pt states he has mostly quit smoking, down to 1 cig/day, sometimes 1 cig lasts 2 days. pt friend present with him attests to this. Advised on generic Chantix to ensure complete cessation, pt will consider.

## 2021-12-09 NOTE — Assessment & Plan Note (Signed)
NEW - started on Eliquis - under Cardiology care for refills, planning on cardioversion/ablation procedure.

## 2021-12-09 NOTE — Assessment & Plan Note (Addendum)
Chronic - taking Lasix and Farxiga qd. Advised pt avoid NSAIDs, drinking 2L water daily unless advised differently by Cardiology.

## 2021-12-09 NOTE — Progress Notes (Signed)
New Patient Office Visit  Subjective:  Patient ID: Timothy Moon, male    DOB: [birth year]  Age: 66 y.o. MRN: [id number]  CC:  Chief Complaint  Patient presents with   Establish Care   Congestive Heart Failure    New Onset: 10/18/2021   Annual Exam   Hypertension   Immunizations    Flu, Pneumonia, Shingles, Tdap    HPI Pradeep Keirsey presents for establishing care and to discuss a few problems. New CHF & HTN:  pt went to ER beginning of Jan with severe SOB and cough, in fluid overload, BNP >550, diuresed, also in A-fib, started on Eliquis. pt had no previous PCP for over 20 years, and had uncontrolled HTN when seen in hospital. Sent home from hospital on Coreg, Hydralazine, Indur, NTG tabs, [place], taking daily and tolerating. Has an appt with CHF clinic on 3/2. pt has a male friend (also roommate) with him today who is very supportive and advocating for him. Reports smoking daily prior to hospital, now down to 1 cig qd or qod. He reports making diet changes and weighing himself daily. A-fib: new, along with newly dx CHF, pt started on Eliquis, has f/u with Dr. Sung Amabile on 3/2 and will see if he can have cardioversion or ablation done. Reports occasional palpitations & fatigue, but also is still getting used to all new medication regimen. Nicotine addiction:  pt reports many years of daily smoking and never trying to quit previous to his new cardiac issues in January. Reports daily morning productive cough that had been worsening throughout the day with SOB, and then realizing in hospital mostly r/t new CHF. Denies hx of any inhaler use.  Reports he stopped smoking when DX with CHF & in hospital, then resumed just 1 cig qd, qod.  Aortic aneurysm: found on CT scan in hospital early January. 4.9cm, seen by vascular and will be monitored. pt states he was advised to quit smoking, which he has begun to do. CKD stage 3:  dx early Jan and thought to be longstanding due to little change in fx  after medications. Pt reports told to avoid NSAIDs, drink plenty of water (up to 2L), stop smoking, and the importance of keeping blood pressure under control.   Past Medical History:  Diagnosis Date   Acute congestive heart failure ([place]) 10/19/2021   Ascending aortic aneurysm 11/23/2021   4.9 cm in January 2023   Chronic kidney disease, stage 3a ([place]) 10/19/2021   Cocaine abuse ([place]) 10/19/2021   Elevated troponin level not due myocardial infarction 10/19/2021   Essential hypertension 10/19/2021   Nicotine dependence, cigarettes, uncomplicated [id number]    History reviewed. No pertinent surgical history.  Family History  Problem Relation Age of Onset   Heart disease Neg Hx     Social History   Socioeconomic History   Marital status: Divorced    Spouse name: Not on file   Number of children: 2   Years of education: Not on file   Highest education level: Associate degree: academic program  Occupational History   Occupation: retired  Tobacco Use   Smoking status: Former    Packs/day: 1.00    Years: 20.00    Pack years: 20.00    Types: Cigarettes    Quit date: 10/17/2021    Years since quitting: 0.1   Smokeless tobacco: Never  Vaping Use   Vaping Use: Never used  Substance and Sexual Activity   Alcohol use: Not Currently    Comment: heavy  drinker x 7 years, quit 11 years ago.   Drug use: Not Currently    Types: Cocaine    Comment: 1-2x/mo last use 10/09/2021   Sexual activity: Not on file  Other Topics Concern   Not on file  Social History Narrative   Not on file   Social Determinants of Health   Financial Resource Strain: Medium Risk   Difficulty of Paying Living Expenses: Somewhat hard  Food Insecurity: No Food Insecurity   Worried About Running Out of Food in the Last Year: Never true   Ran Out of Food in the Last Year: Never true  Transportation Needs: No Transportation Needs   Lack of Transportation (Medical): No   Lack of Transportation (Non-Medical): No  Physical  Activity: Not on file  Stress: Not on file  Social Connections: Not on file  Intimate Partner Violence: Not on file    Objective:   Today's Vitals: BP 118/71    Pulse 89    Temp 98.3 F (36.8 C) (Temporal)    Ht 6\' 3"  (1.905 m)    Wt 255 lb 6.4 oz (115.8 kg)    SpO2 95%    BMI 31.92 kg/m   Physical Exam Vitals and nursing note reviewed.  Constitutional:      General: He is not in acute distress.    Appearance: Normal appearance.  HENT:     Head: Normocephalic.  Cardiovascular:     Rate and Rhythm: Normal rate. Rhythm irregular.  Pulmonary:     Effort: Pulmonary effort is normal.     Breath sounds: Normal breath sounds.  Musculoskeletal:        General: Normal range of motion.     Cervical back: Normal range of motion.     Right lower leg: No edema.     Left lower leg: No edema.  Skin:    General: Skin is warm and dry.  Neurological:     Mental Status: He is alert and oriented to person, place, and time.  Psychiatric:        Mood and Affect: Mood normal.    Assessment & Plan:   Problem List Items Addressed This Visit       Cardiovascular and Mediastinum   Acute congestive heart failure (HCC) - Primary    NEW - sees Dr Jones Broom on 3/2 for first eval, taking Coreg, Hydralazine, Indur, Lasix daily as directed & tolerating. Reports he has made changes to his diet, has mostly quit smoking, down to 1 cig/day, sometimes 1 cig lasts 2 days. pt friend present with him attests to this.      Essential hypertension    Chronic - Coreg, Hydralazine, under Cardiology care for now for refills      Ascending aortic aneurysm    NEW - has been seen by Vascular      Atrial fibrillation (HCC)    NEW - started on Eliquis - under Cardiology care for refills, planning on cardioversion/ablation procedure.        Genitourinary   Chronic kidney disease, stage 3b (HCC)    Chronic - taking Lasix and Farxiga qd. Advised pt avoid NSAIDs, drinking 2L water daily unless advised  differently by Cardiology.        Other   Nicotine dependence, cigarettes, uncomplicated    Chronic - 20 pack year hx, pt states he has mostly quit smoking, down to 1 cig/day, sometimes 1 cig lasts 2 days. pt friend present with him attests to this. Advised on generic  Chantix to ensure complete cessation, pt will consider.      Other Visit Diagnoses     Need for pneumococcal vaccination       Relevant Orders   Pneumococcal conjugate vaccine 20-valent (Prevnar 20) (Completed)   Need for immunization against influenza       Relevant Orders   Flu Vaccine QUAD High Dose(Fluad) (Completed)       Outpatient Encounter Medications as of 12/09/2021  Medication Sig   apixaban (ELIQUIS) 5 MG TABS tablet Take 1 tablet (5 mg total) by mouth 2 (two) times daily.   carvedilol (COREG) 12.5 MG tablet Take 1.5 tablets (18.75 mg total) by mouth 2 (two) times daily with a meal.   dapagliflozin propanediol (FARXIGA) 10 MG TABS tablet Take 1 tablet (10 mg total) by mouth daily.   furosemide (LASIX) 40 MG tablet Take 1 tablet (40 mg total) by mouth daily.   hydrALAZINE (APRESOLINE) 50 MG tablet Take 1.5 tablets (75 mg total) by mouth every 8 (eight) hours.   isosorbide mononitrate (IMDUR) 60 MG 24 hr tablet Take 1 tablet (60 mg total) by mouth daily.   Multiple Vitamin (MULTIVITAMIN) capsule Take 1 capsule by mouth as needed. Centrum Silver for men   nitroGLYCERIN (NITROSTAT) 0.4 MG SL tablet Place 1 tablet (0.4 mg total) under the tongue every 5 (five) minutes as needed for chest pain.   No facility-administered encounter medications on file as of 12/09/2021.    Follow-up: Return for any future concerns.   Jeanie Sewer, NP

## 2021-12-09 NOTE — Assessment & Plan Note (Signed)
NEW - sees Dr Sung Amabile on 3/2 for first eval, taking Coreg, Hydralazine, Indur, Lasix daily as directed & tolerating. Reports he has made changes to his diet, has mostly quit smoking, down to 1 cig/day, sometimes 1 cig lasts 2 days. pt friend present with him attests to this.

## 2021-12-09 NOTE — Telephone Encounter (Signed)
I attempted to reach patient to inform him that it was okay to proceed with ordered home sleep study and that insurance prior authorization is not required per Clinic CMA.  There was no answer at noted telephone contact and no ability to leave a message.

## 2021-12-14 ENCOUNTER — Ambulatory Visit (HOSPITAL_COMMUNITY): Payer: Medicare Other | Admitting: Nurse Practitioner

## 2021-12-14 ENCOUNTER — Encounter (HOSPITAL_COMMUNITY): Payer: Self-pay

## 2021-12-16 ENCOUNTER — Encounter (HOSPITAL_COMMUNITY): Payer: Medicare Other | Admitting: Internal Medicine

## 2021-12-23 ENCOUNTER — Other Ambulatory Visit (HOSPITAL_COMMUNITY): Payer: Self-pay

## 2021-12-24 ENCOUNTER — Telehealth (HOSPITAL_COMMUNITY): Payer: Self-pay | Admitting: Surgery

## 2021-12-24 NOTE — Telephone Encounter (Signed)
I attempted to reach patient in reference to incomplete ordered home sleep study.  There was no answer and no ability to leave a message. ?

## 2022-01-12 NOTE — Telephone Encounter (Signed)
Advanced Heart Failure Patient Advocate Encounter ? ?Called [employer] to check the status of the patient's application. Representative stated that there was missing information on the application. Was able to provide that over the phone. Was advised that a determination would likely be in 5-7 business days. ? ?

## 2022-01-13 NOTE — Telephone Encounter (Addendum)
Advanced Heart Failure Patient Advocate Encounter  ? ?Patient was approved to receive Eliquis from [employer] ? ?Effective dates: 01/13/22 through 10/16/22 ? ?Attempted to call patient, no way to leave vm.  ?Document scanned to chart.  ? ?Archer Asa, CPhT ? ? ? ?

## 2022-02-07 ENCOUNTER — Other Ambulatory Visit (HOSPITAL_COMMUNITY): Payer: Self-pay

## 2022-02-09 ENCOUNTER — Other Ambulatory Visit (HOSPITAL_COMMUNITY): Payer: Self-pay

## 2022-03-21 ENCOUNTER — Other Ambulatory Visit: Payer: Self-pay | Admitting: Thoracic Surgery (Cardiothoracic Vascular Surgery)

## 2022-03-21 DIAGNOSIS — I7121 Aneurysm of the ascending aorta, without rupture: Secondary | ICD-10-CM

## 2022-04-12 ENCOUNTER — Other Ambulatory Visit (HOSPITAL_COMMUNITY): Payer: Self-pay

## 2022-05-07 ENCOUNTER — Other Ambulatory Visit (HOSPITAL_COMMUNITY): Payer: Self-pay | Admitting: Cardiology

## 2022-05-09 ENCOUNTER — Other Ambulatory Visit (HOSPITAL_COMMUNITY): Payer: Self-pay

## 2022-05-09 MED ORDER — FUROSEMIDE 40 MG PO TABS
40.0000 mg | ORAL_TABLET | Freq: Every day | ORAL | 3 refills | Status: DC
  Filled 2022-05-09: qty 30, 30d supply, fill #0
  Filled 2022-06-08: qty 30, 30d supply, fill #1
  Filled 2022-07-14: qty 30, 30d supply, fill #2

## 2022-05-09 MED ORDER — DAPAGLIFLOZIN PROPANEDIOL 10 MG PO TABS
10.0000 mg | ORAL_TABLET | Freq: Every day | ORAL | 3 refills | Status: DC
  Filled 2022-05-09: qty 30, 30d supply, fill #0
  Filled 2022-06-08: qty 30, 30d supply, fill #1
  Filled 2022-07-14: qty 30, 30d supply, fill #2

## 2022-05-09 MED ORDER — ISOSORBIDE MONONITRATE ER 60 MG PO TB24
60.0000 mg | ORAL_TABLET | Freq: Every day | ORAL | 3 refills | Status: DC
  Filled 2022-05-09: qty 30, 30d supply, fill #0
  Filled 2022-06-08: qty 30, 30d supply, fill #1
  Filled 2022-07-14: qty 30, 30d supply, fill #2

## 2022-05-09 MED ORDER — HYDRALAZINE HCL 50 MG PO TABS
75.0000 mg | ORAL_TABLET | Freq: Three times a day (TID) | ORAL | 3 refills | Status: DC
  Filled 2022-05-09: qty 135, 30d supply, fill #0
  Filled 2022-06-08: qty 135, 30d supply, fill #1
  Filled 2022-07-14: qty 135, 30d supply, fill #2

## 2022-05-10 ENCOUNTER — Other Ambulatory Visit (HOSPITAL_COMMUNITY): Payer: Self-pay

## 2022-05-10 ENCOUNTER — Other Ambulatory Visit (HOSPITAL_COMMUNITY): Payer: Self-pay | Admitting: Family Medicine

## 2022-05-10 MED ORDER — CARVEDILOL 12.5 MG PO TABS
18.7500 mg | ORAL_TABLET | Freq: Two times a day (BID) | ORAL | 3 refills | Status: DC
  Filled 2022-05-10: qty 90, 30d supply, fill #0
  Filled 2022-06-08: qty 90, 30d supply, fill #1
  Filled 2022-07-14: qty 90, 30d supply, fill #2

## 2022-05-13 ENCOUNTER — Ambulatory Visit
Admission: RE | Admit: 2022-05-13 | Discharge: 2022-05-13 | Disposition: A | Discharge status: Home / Self Care | Payer: Medicare Other | Source: Ambulatory Visit | Attending: Thoracic Surgery (Cardiothoracic Vascular Surgery) | Admitting: Thoracic Surgery (Cardiothoracic Vascular Surgery)

## 2022-05-13 DIAGNOSIS — J9811 Atelectasis: Secondary | ICD-10-CM | POA: Diagnosis not present

## 2022-05-13 DIAGNOSIS — I7121 Aneurysm of the ascending aorta, without rupture: Secondary | ICD-10-CM

## 2022-05-13 DIAGNOSIS — R0602 Shortness of breath: Secondary | ICD-10-CM | POA: Diagnosis not present

## 2022-05-13 DIAGNOSIS — J90 Pleural effusion, not elsewhere classified: Secondary | ICD-10-CM | POA: Diagnosis not present

## 2022-05-16 ENCOUNTER — Other Ambulatory Visit: Payer: Medicare Other

## 2022-05-17 ENCOUNTER — Other Ambulatory Visit: Payer: Medicare Other

## 2022-05-17 ENCOUNTER — Encounter: Payer: Self-pay | Admitting: Thoracic Surgery (Cardiothoracic Vascular Surgery)

## 2022-05-17 ENCOUNTER — Ambulatory Visit (INDEPENDENT_AMBULATORY_CARE_PROVIDER_SITE_OTHER): Payer: Medicare Other | Admitting: Thoracic Surgery (Cardiothoracic Vascular Surgery)

## 2022-05-17 VITALS — BP 145/64 | HR 92 | Resp 20 | Ht 75.0 in | Wt 266.0 lb

## 2022-05-17 DIAGNOSIS — I7121 Aneurysm of the ascending aorta, without rupture: Secondary | ICD-10-CM | POA: Diagnosis not present

## 2022-05-17 NOTE — Progress Notes (Signed)
[address]       [place] [address]             [phone]     HPI: Timothy Moon returns for a scheduled follow-up regarding his ascending thoracic aneurysm  Timothy Moon is a 66 year old man with a history of hypertension, tobacco abuse, stage III chronic kidney disease, atrial fibrillation, congestive heart failure, and an ascending aortic aneurysm.  In January 2023 he was hospitalized with decompensated acute congestive heart failure.  A CT of the chest showed a 4.9 cm ascending aneurysm.  An echocardiogram showed aortic sclerosis but no stenosis.  I saw him in February after he had been discharged from the hospital.  He was feeling better at that time  He says that he has an appointment scheduled with the atrial fibrillation clinic.  He has been having some shortness of breath which she thinks is due to fluid buildup in his lungs.  He has not smoked since January.  Past Medical History:  Diagnosis Date   Acute congestive heart failure (HCC) 10/19/2021   Ascending aortic aneurysm (HCC) 11/23/2021   4.9 cm in January 2023   Chronic kidney disease, stage 3a (HCC) 10/19/2021   Cocaine abuse (HCC) 10/19/2021   Elevated troponin level not due myocardial infarction 10/19/2021   Essential hypertension 10/19/2021   Nicotine dependence, cigarettes, uncomplicated 10/19/2021     Current Outpatient Medications  Medication Sig Dispense Refill   apixaban (ELIQUIS) 5 MG TABS tablet Take 1 tablet (5 mg total) by mouth 2 (two) times daily. 60 tablet 11   carvedilol (COREG) 12.5 MG tablet Take 1.5 tablets (18.75 mg total) by mouth 2 (two) times daily with a meal. 90 tablet 3   dapagliflozin propanediol (FARXIGA) 10 MG TABS tablet Take 1 tablet (10 mg total) by mouth daily. 30 tablet 3   furosemide (LASIX) 40 MG tablet Take 1 tablet (40 mg total) by mouth daily. 30 tablet 3   hydrALAZINE (APRESOLINE) 50 MG tablet Take 1.5 tablets (75 mg total) by mouth every 8 (eight) hours. 135  tablet 3   isosorbide mononitrate (IMDUR) 60 MG 24 hr tablet Take 1 tablet (60 mg total) by mouth daily. 30 tablet 3   Multiple Vitamin (MULTIVITAMIN) capsule Take 1 capsule by mouth as needed. Centrum Silver for men     nitroGLYCERIN (NITROSTAT) 0.4 MG SL tablet Place 1 tablet (0.4 mg total) under the tongue every 5 (five) minutes as needed for chest pain. 30 tablet 12   No current facility-administered medications for this visit.    Physical Exam BP (!) 145/64   Pulse 92   Resp 20   Ht 6\' 3"  (1.905 m)   Wt 266 lb (120.7 kg)   SpO2 95% Comment: RA  BMI 33.54 kg/m  66 year old man in no acute distress Alert and oriented x3 with no focal deficits Lungs clear with equal breath sounds bilaterally No wheezing or rails Cardiac irregularly irregular, faint systolic murmur No carotid bruit  Diagnostic Tests: CT CHEST WITHOUT CONTRAST   TECHNIQUE: Multidetector CT imaging of the chest was performed following the standard protocol without IV contrast.   RADIATION DOSE REDUCTION: This exam was performed according to the departmental dose-optimization program which includes automated exposure control, adjustment of the mA and/or kV according to patient size and/or use of iterative reconstruction technique.   COMPARISON:  Radiographs 10/18/2021.  CT 10/20/2021.   FINDINGS: Cardiovascular: Again demonstrated is extensive coronary artery atherosclerosis with lesser involvement  of the aorta and great vessels. There is diffuse dilatation of the ascending aorta without evidence of displaced intimal calcifications. Accurate measurements of the ascending aorta are limited by mild cardiac pulsation artifact on this nongated study. Subjectively, no significant changes identified with a maximal diameter of approximately 5.0 cm. There are possible calcifications of the aortic valve. Stable mild cardiac enlargement without evidence of pericardial fluid.   Mediastinum/Nodes: There are no  enlarged mediastinal, hilar or axillary lymph nodes.Small mediastinal lymph nodes are unchanged. Hilar assessment is limited by the lack of intravenous contrast, although the hilar contours appear unchanged. 1.4 cm low-density left thyroid nodule is grossly stable; No followup recommended.(Ref: J Am Coll Radiol. 2015 Feb;12(2): 143-50). The esophagus appears unremarkable.   Lungs/Pleura: Previously demonstrated small pleural effusions have resolved. There is improved aeration of the left lung base. There is mild subsegmental atelectasis in the right lower lobe. No suspicious pulmonary nodules. Underlying mild centrilobular emphysema.   Upper abdomen: Images through the upper abdomen are degraded by mild breathing artifact. Grossly unchanged low-density renal cysts bilaterally which do not require any specific imaging follow-up unless otherwise clinically warranted.   Musculoskeletal/Chest wall: There is no chest wall mass or suspicious osseous finding. Mild multilevel spondylosis.   IMPRESSION: 1. No acute findings or definite change in appearance of the thoracic aorta compared with prior examination. The ascending aorta appears diffusely dilated with a maximal diameter of approximately 5 cm. Consider continued semi-annual imaging followup by CTA or MRA. This recommendation follows 2010 ACCF/AHA/AATS/ACR/ASA/SCA/SCAI/SIR/STS/SVM Guidelines for the Diagnosis and Management of Patients With Thoracic Aortic Disease. Circulation. 2010; 121: K093-G182. Aortic aneurysm NOS (ICD10-I71.9) 2. Interval resolved pleural effusions with residual subsegmental atelectasis at the right lung base. 3. Stable appearance of the visualized upper abdomen. 4. Cardiomegaly. Coronary and aortic atherosclerosis (ICD10-I70.0). Emphysema (ICD10-J43.9).     Electronically Signed   By: Carey Bullocks M.D.   On: 05/13/2022 11:20 I personally reviewed the CT images.  There is a a 4.9 to 5 cm ascending  aneurysm.  Extensive coronary and thoracic aortic calcifications.  Impression: Timothy Moon is a 66 year old man with a history of hypertension, tobacco abuse, stage III chronic kidney disease, congestive heart failure, atrial fibrillation, and an ascending aortic aneurysm.  Ascending aneurysm-first noted in January 2023.  Stable at about 4.9 to 5 cm.  Scans being done without contrast due to history of CKD.  Blood pressure control is primary means of treatment.  Hypertension-blood pressure slightly elevated at 145 systolic today.  Continue to monitor at home.  No medication changes made today.  Tobacco abuse-quit smoking when hospitalized in January.  He says he is not smoking at all.  I congratulated him on that.  Plan: Return in 6 months with CT chest.  We will not use contrast due to CKD.  Loreli Slot, MD [facility] [phone]

## 2022-06-08 ENCOUNTER — Other Ambulatory Visit (HOSPITAL_COMMUNITY): Payer: Self-pay

## 2022-06-13 ENCOUNTER — Other Ambulatory Visit (HOSPITAL_COMMUNITY): Payer: Self-pay

## 2022-06-29 ENCOUNTER — Other Ambulatory Visit (HOSPITAL_COMMUNITY): Payer: Self-pay

## 2022-07-11 ENCOUNTER — Encounter: Payer: Self-pay | Admitting: *Deleted

## 2022-07-15 ENCOUNTER — Other Ambulatory Visit (HOSPITAL_COMMUNITY): Payer: Self-pay

## 2022-07-22 ENCOUNTER — Telehealth: Payer: Self-pay | Admitting: Family

## 2022-07-22 NOTE — Telephone Encounter (Signed)
Copied from [place] [phone]. Topic: Medicare AWV >> Jul 22, 2022 11:15 AM Devoria Glassing wrote: Reason for CRM: Left message for patient to schedule Annual Wellness Visit.  Please schedule with Nurse Health Advisor Charlott Rakes, RN at [facility]. This appt can be telephone or office visit. Please call [phone] ask for [facility]

## 2022-07-25 ENCOUNTER — Other Ambulatory Visit (HOSPITAL_COMMUNITY): Payer: Self-pay

## 2022-07-25 ENCOUNTER — Ambulatory Visit (INDEPENDENT_AMBULATORY_CARE_PROVIDER_SITE_OTHER): Payer: Medicare Other | Admitting: *Deleted

## 2022-07-25 DIAGNOSIS — Z0100 Encounter for examination of eyes and vision without abnormal findings: Secondary | ICD-10-CM

## 2022-07-25 DIAGNOSIS — I5021 Acute systolic (congestive) heart failure: Secondary | ICD-10-CM

## 2022-07-25 DIAGNOSIS — Z0000 Encounter for general adult medical examination without abnormal findings: Secondary | ICD-10-CM

## 2022-07-25 NOTE — Progress Notes (Signed)
Subjective:   Timothy Moon is a 66 y.o. male who presents for an Initial Medicare Annual Wellness Visit. I connected with  Timothy Moon on 07/25/22 by a audio enabled telemedicine application and verified that I am speaking with the correct person using two identifiers.  Patient Location: Home  Provider Location: Home Office  I discussed the limitations of evaluation and management by telemedicine. The patient expressed understanding and agreed to proceed.  Review of Systems    Deferred to PCP Cardiac Risk Factors include: advanced age (>42men, >63 women);hypertension;male gender;dyslipidemia     Objective:    There were no vitals filed for this visit. There is no height or weight on file to calculate BMI.     07/25/2022    3:52 PM 10/19/2021    8:41 PM 10/18/2021   11:55 PM  Advanced Directives  Does Patient Have a Medical Advance Directive? No No No  Would patient like information on creating a medical advance directive? No - Patient declined No - Patient declined No - Patient declined    Current Medications (verified) Outpatient Encounter Medications as of 07/25/2022  Medication Sig   carvedilol (COREG) 12.5 MG tablet Take 1.5 tablets (18.75 mg total) by mouth 2 (two) times daily with a meal.   dapagliflozin propanediol (FARXIGA) 10 MG TABS tablet Take 1 tablet (10 mg total) by mouth daily.   furosemide (LASIX) 40 MG tablet Take 1 tablet (40 mg total) by mouth daily.   hydrALAZINE (APRESOLINE) 50 MG tablet Take 1.5 tablets (75 mg total) by mouth every 8 (eight) hours.   isosorbide mononitrate (IMDUR) 60 MG 24 hr tablet Take 1 tablet (60 mg total) by mouth daily.   Multiple Vitamin (MULTIVITAMIN) capsule Take 1 capsule by mouth as needed. Centrum Silver for men   nitroGLYCERIN (NITROSTAT) 0.4 MG SL tablet Place 1 tablet (0.4 mg total) under the tongue every 5 (five) minutes as needed for chest pain.   [DISCONTINUED] apixaban (ELIQUIS) 5 MG TABS tablet Take 1 tablet (5 mg  total) by mouth 2 (two) times daily.   No facility-administered encounter medications on file as of 07/25/2022.    Allergies (verified) Patient has no known allergies.   History: Past Medical History:  Diagnosis Date   Acute congestive heart failure ([place]) 10/19/2021   Ascending aortic aneurysm ([place]) 11/23/2021   4.9 cm in January 2023   Chronic kidney disease, stage 3a ([place]) 10/19/2021   Cocaine abuse ([place]) 10/19/2021   Elevated troponin level not due myocardial infarction 10/19/2021   Essential hypertension 10/19/2021   Nicotine dependence, cigarettes, uncomplicated 05/19/1516   History reviewed. No pertinent surgical history. Family History  Problem Relation Age of Onset   Heart disease Neg Hx    Social History   Socioeconomic History   Marital status: Divorced    Spouse name: Not on file   Number of children: 2   Years of education: Not on file   Highest education level: Associate degree: academic program  Occupational History   Occupation: retired  Tobacco Use   Smoking status: Former    Packs/day: 1.00    Years: 20.00    Total pack years: 20.00    Types: Cigarettes    Quit date: 10/17/2021    Years since quitting: 0.7   Smokeless tobacco: Never  Vaping Use   Vaping Use: Never used  Substance and Sexual Activity   Alcohol use: Not Currently    Comment: heavy drinker x 7 years, quit 11 years ago.  Drug use: Not Currently    Types: Cocaine    Comment: 1-2x/mo last use 10/09/2021   Sexual activity: Not Currently  Other Topics Concern   Not on file  Social History Narrative   Not on file   Social Determinants of Health   Financial Resource Strain: Low Risk  (07/25/2022)   Overall Financial Resource Strain (CARDIA)    Difficulty of Paying Living Expenses: Not very hard  Food Insecurity: No Food Insecurity (07/25/2022)   Hunger Vital Sign    Worried About Running Out of Food in the Last Year: Never true    Ran Out of Food in the Last Year: Never true  Transportation  Needs: No Transportation Needs (07/25/2022)   PRAPARE - Administrator, Civil Service (Medical): No    Lack of Transportation (Non-Medical): No  Physical Activity: Inactive (07/25/2022)   Exercise Vital Sign    Days of Exercise per Week: 0 days    Minutes of Exercise per Session: 0 min  Stress: No Stress Concern Present (07/25/2022)   [employer] of Occupational Health - Occupational Stress Questionnaire    Feeling of Stress : Only a little  Social Connections: Socially Isolated (07/25/2022)   Social Connection and Isolation Panel [NHANES]    Frequency of Communication with Friends and Family: More than three times a week    Frequency of Social Gatherings with Friends and Family: More than three times a week    Attends Religious Services: Never    Database administrator or Organizations: No    Attends Engineer, structural: Never    Marital Status: Divorced    Tobacco Counseling Counseling given: Not Answered   Clinical Intake:  Pre-visit preparation completed: Yes  Pain : No/denies pain     Nutritional Status: BMI 25 -29 Overweight Nutritional Risks: None Diabetes: No  How often do you need to have someone help you when you read instructions, pamphlets, or other written materials from your doctor or pharmacy?: 1 - Never What is the last grade level you completed in school?: some college  Diabetic?No   Interpreter Needed?: No  Information entered by :: Blanchie Serve RN   Activities of Daily Living    07/25/2022    3:50 PM 10/19/2021    8:43 PM  In your present state of health, do you have any difficulty performing the following activities:  Hearing? 0   Vision? 0   Difficulty concentrating or making decisions? 0   Walking or climbing stairs? 0   Dressing or bathing? 0   Doing errands, shopping? 0 0  Preparing Food and eating ? N   Using the Toilet? N   In the past six months, have you accidently leaked urine? N   Do you have problems with  loss of bowel control? N   Managing your Medications? N   Managing your Finances? N   Housekeeping or managing your Housekeeping? N     Patient Care Team: Dulce Sellar, NP as PCP - General (Family Medicine) Jens Som Madolyn Frieze, MD as PCP - Cardiology (Cardiology)  Indicate any recent Medical Services you may have received from other than [facility] providers in the past year (date may be approximate).     Assessment:   This is a routine wellness examination for Timothy Moon.  Hearing/Vision screen No results found.  Dietary issues and exercise activities discussed: Current Exercise Habits: The patient does not participate in regular exercise at present, Exercise limited by: orthopedic condition(s)  Goals Addressed             This Visit's Progress    Patient Stated       Stay as healthy as possible.      Depression Screen    07/25/2022    3:45 PM 12/09/2021    2:04 PM  PHQ 2/9 Scores  PHQ - 2 Score 1 0    Fall Risk    12/09/2021    2:04 PM  [place] in the past year? 0  Risk for fall due to : No Fall Risks    FALL RISK PREVENTION PERTAINING TO THE HOME:  Any stairs in or around the home? No  If so, are there any without handrails?  N/A Home free of loose throw rugs in walkways, pet beds, electrical cords, etc? Yes  Adequate lighting in your home to reduce risk of falls? Yes   ASSISTIVE DEVICES UTILIZED TO PREVENT FALLS:  Life alert? No  Use of a cane, walker or w/c? No  Grab bars in the bathroom? No  Shower chair or bench in shower? No  Elevated toilet seat or a handicapped toilet? No   Cognitive Function:        07/25/2022    3:53 PM  6CIT Screen  What Year? 0 points  What month? 0 points  What time? 0 points  Count back from 20 0 points  Months in reverse 0 points  Repeat phrase 0 points  Total Score 0 points    Immunizations Immunization History  Administered Date(s) Administered   Fluad Quad(high Dose 65+) 12/09/2021    PNEUMOCOCCAL CONJUGATE-20 12/09/2021    TDAP status: Due, Education has been provided regarding the importance of this vaccine. Advised may receive this vaccine at local pharmacy or Health Dept. Aware to provide a copy of the vaccination record if obtained from local pharmacy or Health Dept. Verbalized acceptance and understanding.  Flu Vaccine status: Due, Education has been provided regarding the importance of this vaccine. Advised may receive this vaccine at local pharmacy or Health Dept. Aware to provide a copy of the vaccination record if obtained from local pharmacy or Health Dept. Verbalized acceptance and understanding.  Pneumococcal vaccine status: Due, Education has been provided regarding the importance of this vaccine. Advised may receive this vaccine at local pharmacy or Health Dept. Aware to provide a copy of the vaccination record if obtained from local pharmacy or Health Dept. Verbalized acceptance and understanding.  Covid-19 vaccine status: Information provided on how to obtain vaccines.   Qualifies for Shingles Vaccine? Yes   Zostavax completed No   Shingrix Completed?: No.    Education has been provided regarding the importance of this vaccine. Patient has been advised to call insurance company to determine out of pocket expense if they have not yet received this vaccine. Advised may also receive vaccine at local pharmacy or Health Dept. Verbalized acceptance and understanding.  Screening Tests Health Maintenance  Topic Date Due   COVID-19 Vaccine (1) Never done   Hepatitis C Screening  Never done   TETANUS/TDAP  Never done   COLONOSCOPY (Pts 45-17yrs Insurance coverage will need to be confirmed)  Never done   Zoster Vaccines- Shingrix (1 of 2) Never done   INFLUENZA VACCINE  01/15/2023 (Originally 05/17/2022)   Pneumonia Vaccine 37+ Years old  Completed   HIV Screening  Completed   HPV VACCINES  Aged Out    Health Maintenance  Health Maintenance Due  Topic Date Due  COVID-19 Vaccine (1) Never done   Hepatitis C Screening  Never done   TETANUS/TDAP  Never done   COLONOSCOPY (Pts 45-74yrs Insurance coverage will need to be confirmed)  Never done   Zoster Vaccines- Shingrix (1 of 2) Never done    Colorectal Screening: Patient states he will discuss this with PCP  Lung Cancer Screening: (Low Dose CT Chest recommended if Age 25-80 years, 30 pack-year currently smoking OR have quit w/in 15years.) does qualify.   Lung Cancer Screening Referral: Deferred to PCP  Additional Screening:  Hepatitis C Screening: does qualify; Completed education provided  Vision Screening: Recommended annual ophthalmology exams for early detection of glaucoma and other disorders of the eye. Is the patient up to date with their annual eye exam?  No  Who is the provider or what is the name of the office in which the patient attends annual eye exams? Reports he does not have an eye doctor If pt is not established with a provider, would they like to be referred to a provider to establish care? Yes .   Dental Screening: Recommended annual dental exams for proper oral hygiene  Community Resource Referral / Chronic Care Management: CRR required this visit?  No   CCM required this visit?  Yes , patient states he would like assistance with CHF, CKD, Afib, and diet     Plan:     I have personally reviewed and noted the following in the patient's chart:   Medical and social history Use of alcohol, tobacco or illicit drugs  Current medications and supplements including opioid prescriptions. Patient is not currently taking opioid prescriptions. Functional ability and status Nutritional status Physical activity Advanced directives List of other physicians Hospitalizations, surgeries, and ER visits in previous 12 months Vitals Screenings to include cognitive, depression, and falls Referrals and appointments  In addition, I have reviewed and discussed with patient certain  preventive protocols, quality metrics, and best practice recommendations. A written personalized care plan for preventive services as well as general preventive health recommendations were provided to patient.     Michiel Cowboy, RN   07/25/2022   Nurse Notes:  Mr. Cordy , Thank you for taking time to come for your Medicare Wellness Visit. I appreciate your ongoing commitment to your health goals. Please review the following plan we discussed and let me know if I can assist you in the future.   These are the goals we discussed:  Goals      Patient Stated     Stay as healthy as possible.        This is a list of the screening recommended for you and due dates:  Health Maintenance  Topic Date Due   COVID-19 Vaccine (1) Never done   Hepatitis C Screening: USPSTF Recommendation to screen - Ages 14-79 yo.  Never done   Tetanus Vaccine  Never done   Colon Cancer Screening  Never done   Zoster (Shingles) Vaccine (1 of 2) Never done   Flu Shot  01/15/2023*   Pneumonia Vaccine  Completed   HIV Screening  Completed   HPV Vaccine  Aged Out  *Topic was postponed. The date shown is not the original due date.

## 2022-07-25 NOTE — Patient Instructions (Signed)
Health Maintenance, Male Adopting a healthy lifestyle and getting preventive care are important in promoting health and wellness. Ask your health care provider about: The right schedule for you to have regular tests and exams. Things you can do on your own to prevent diseases and keep yourself healthy. What should I know about diet, weight, and exercise? Eat a healthy diet  Eat a diet that includes plenty of vegetables, fruits, low-fat dairy products, and lean protein. Do not eat a lot of foods that are high in solid fats, added sugars, or sodium. Maintain a healthy weight Body mass index (BMI) is a measurement that can be used to identify possible weight problems. It estimates body fat based on height and weight. Your health care provider can help determine your BMI and help you achieve or maintain a healthy weight. Get regular exercise Get regular exercise. This is one of the most important things you can do for your health. Most adults should: Exercise for at least 150 minutes each week. The exercise should increase your heart rate and make you sweat (moderate-intensity exercise). Do strengthening exercises at least twice a week. This is in addition to the moderate-intensity exercise. Spend less time sitting. Even light physical activity can be beneficial. Watch cholesterol and blood lipids Have your blood tested for lipids and cholesterol at 66 years of age, then have this test every 5 years. You may need to have your cholesterol levels checked more often if: Your lipid or cholesterol levels are high. You are older than 66 years of age. You are at high risk for heart disease. What should I know about cancer screening? Many types of cancers can be detected early and may often be prevented. Depending on your health history and family history, you may need to have cancer screening at various ages. This may include screening for: Colorectal cancer. Prostate cancer. Skin cancer. Lung  cancer. What should I know about heart disease, diabetes, and high blood pressure? Blood pressure and heart disease High blood pressure causes heart disease and increases the risk of stroke. This is more likely to develop in people who have high blood pressure readings or are overweight. Talk with your health care provider about your target blood pressure readings. Have your blood pressure checked: Every 3-5 years if you are 18-39 years of age. Every year if you are 40 years old or older. If you are between the ages of 65 and 75 and are a current or former smoker, ask your health care provider if you should have a one-time screening for abdominal aortic aneurysm (AAA). Diabetes Have regular diabetes screenings. This checks your fasting blood sugar level. Have the screening done: Once every three years after age 45 if you are at a normal weight and have a low risk for diabetes. More often and at a younger age if you are overweight or have a high risk for diabetes. What should I know about preventing infection? Hepatitis B If you have a higher risk for hepatitis B, you should be screened for this virus. Talk with your health care provider to find out if you are at risk for hepatitis B infection. Hepatitis C Blood testing is recommended for: Everyone born from 1945 through 1965. Anyone with known risk factors for hepatitis C. Sexually transmitted infections (STIs) You should be screened each year for STIs, including gonorrhea and chlamydia, if: You are sexually active and are younger than 66 years of age. You are older than 66 years of age and your   health care provider tells you that you are at risk for this type of infection. Your sexual activity has changed since you were last screened, and you are at increased risk for chlamydia or gonorrhea. Ask your health care provider if you are at risk. Ask your health care provider about whether you are at high risk for HIV. Your health care provider  may recommend a prescription medicine to help prevent HIV infection. If you choose to take medicine to prevent HIV, you should first get tested for HIV. You should then be tested every 3 months for as long as you are taking the medicine. Follow these instructions at home: Alcohol use Do not drink alcohol if your health care provider tells you not to drink. If you drink alcohol: Limit how much you have to 0-2 drinks a day. Know how much alcohol is in your drink. In the [place] one drink equals one 12 oz bottle of beer (355 mL), one 5 oz glass of Keanu Frickey (148 mL), or one 1 oz glass of hard liquor (44 mL). Lifestyle Do not use any products that contain nicotine or tobacco. These products include cigarettes, chewing tobacco, and vaping devices, such as e-cigarettes. If you need help quitting, ask your health care provider. Do not use street drugs. Do not share needles. Ask your health care provider for help if you need support or information about quitting drugs. General instructions Schedule regular health, dental, and eye exams. Stay current with your vaccines. Tell your health care provider if: You often feel depressed. You have ever been abused or do not feel safe at home. Summary Adopting a healthy lifestyle and getting preventive care are important in promoting health and wellness. Follow your health care provider's instructions about healthy diet, exercising, and getting tested or screened for diseases. Follow your health care provider's instructions on monitoring your cholesterol and blood pressure. This information is not intended to replace advice given to you by your health care provider. Make sure you discuss any questions you have with your health care provider. Document Revised: 02/22/2021 Document Reviewed: 02/22/2021 Elsevier Patient Education  2023 Elsevier Inc.  

## 2022-07-27 ENCOUNTER — Telehealth: Payer: Self-pay

## 2022-07-27 NOTE — Chronic Care Management (AMB) (Signed)
  Care Coordination  Outreach Note  07/27/2022 Name: Timothy Moon MRN: [id number] DOB: [birth year]   Care Coordination Outreach Attempts: An unsuccessful telephone outreach was attempted today to offer the patient information about available care coordination services as a benefit of their health plan.   Follow Up Plan:  Additional outreach attempts will be made to offer the patient care coordination information and services.   Encounter Outcome:  No Answer  Sig Noreene Larsson, [place], [place] [address] Direct Dial: [phone] [email or web address]

## 2022-08-01 ENCOUNTER — Ambulatory Visit (HOSPITAL_COMMUNITY): Payer: Medicare Other | Admitting: Nurse Practitioner

## 2022-08-08 ENCOUNTER — Telehealth (HOSPITAL_COMMUNITY): Payer: Self-pay

## 2022-08-08 NOTE — Telephone Encounter (Signed)
Called and was unable to leave a voice message to confirm/remind patient of their appointment at the [facility] on 08/09/22.

## 2022-08-09 ENCOUNTER — Encounter (HOSPITAL_COMMUNITY): Payer: Self-pay

## 2022-08-09 ENCOUNTER — Ambulatory Visit (HOSPITAL_COMMUNITY)
Admission: RE | Admit: 2022-08-09 | Discharge: 2022-08-09 | Disposition: A | Discharge status: Home / Self Care | Payer: Medicare Other | Source: Ambulatory Visit | Attending: Family Medicine | Admitting: Family Medicine

## 2022-08-09 ENCOUNTER — Telehealth (HOSPITAL_COMMUNITY): Payer: Self-pay | Admitting: Pharmacy Technician

## 2022-08-09 ENCOUNTER — Other Ambulatory Visit (HOSPITAL_COMMUNITY): Payer: Self-pay

## 2022-08-09 VITALS — BP 178/104 | HR 99 | Wt 264.6 lb

## 2022-08-09 DIAGNOSIS — I4891 Unspecified atrial fibrillation: Secondary | ICD-10-CM

## 2022-08-09 DIAGNOSIS — R5383 Other fatigue: Secondary | ICD-10-CM | POA: Diagnosis not present

## 2022-08-09 DIAGNOSIS — R1900 Intra-abdominal and pelvic swelling, mass and lump, unspecified site: Secondary | ICD-10-CM | POA: Diagnosis not present

## 2022-08-09 DIAGNOSIS — I130 Hypertensive heart and chronic kidney disease with heart failure and stage 1 through stage 4 chronic kidney disease, or unspecified chronic kidney disease: Secondary | ICD-10-CM | POA: Diagnosis not present

## 2022-08-09 DIAGNOSIS — N1831 Chronic kidney disease, stage 3a: Secondary | ICD-10-CM | POA: Diagnosis not present

## 2022-08-09 DIAGNOSIS — F1410 Cocaine abuse, uncomplicated: Secondary | ICD-10-CM | POA: Insufficient documentation

## 2022-08-09 DIAGNOSIS — I4819 Other persistent atrial fibrillation: Secondary | ICD-10-CM | POA: Insufficient documentation

## 2022-08-09 DIAGNOSIS — Z79899 Other long term (current) drug therapy: Secondary | ICD-10-CM | POA: Insufficient documentation

## 2022-08-09 DIAGNOSIS — Z7901 Long term (current) use of anticoagulants: Secondary | ICD-10-CM | POA: Diagnosis not present

## 2022-08-09 DIAGNOSIS — R0683 Snoring: Secondary | ICD-10-CM | POA: Insufficient documentation

## 2022-08-09 DIAGNOSIS — I712 Thoracic aortic aneurysm, without rupture, unspecified: Secondary | ICD-10-CM | POA: Insufficient documentation

## 2022-08-09 DIAGNOSIS — I509 Heart failure, unspecified: Secondary | ICD-10-CM

## 2022-08-09 DIAGNOSIS — Z87891 Personal history of nicotine dependence: Secondary | ICD-10-CM | POA: Insufficient documentation

## 2022-08-09 DIAGNOSIS — I10 Essential (primary) hypertension: Secondary | ICD-10-CM | POA: Diagnosis not present

## 2022-08-09 DIAGNOSIS — I5023 Acute on chronic systolic (congestive) heart failure: Secondary | ICD-10-CM | POA: Diagnosis not present

## 2022-08-09 DIAGNOSIS — I7121 Aneurysm of the ascending aorta, without rupture: Secondary | ICD-10-CM | POA: Insufficient documentation

## 2022-08-09 LAB — BASIC METABOLIC PANEL
Anion gap: 9 (ref 5–15)
BUN: 33 mg/dL — ABNORMAL HIGH (ref 8–23)
CO2: 23 mmol/L (ref 22–32)
Calcium: 9.7 mg/dL (ref 8.9–10.3)
Chloride: 111 mmol/L (ref 98–111)
Creatinine, Ser: 1.82 mg/dL — ABNORMAL HIGH (ref 0.61–1.24)
GFR, Estimated: 41 mL/min — ABNORMAL LOW (ref >60)
Glucose, Bld: 97 mg/dL (ref 70–99)
Potassium: 4.3 mmol/L (ref 3.5–5.1)
Sodium: 143 mmol/L (ref 135–145)

## 2022-08-09 LAB — BRAIN NATRIURETIC PEPTIDE: B Natriuretic Peptide: 611.3 pg/mL — ABNORMAL HIGH (ref 0.0–100.0)

## 2022-08-09 MED ORDER — POTASSIUM CHLORIDE CRYS ER 20 MEQ PO TBCR
20.0000 meq | EXTENDED_RELEASE_TABLET | Freq: Every day | ORAL | 3 refills | Status: DC
  Filled 2022-08-09: qty 30, 30d supply, fill #0
  Filled 2022-11-03: qty 30, 30d supply, fill #1
  Filled 2023-06-06: qty 30, 30d supply, fill #2

## 2022-08-09 MED ORDER — DAPAGLIFLOZIN PROPANEDIOL 10 MG PO TABS
10.0000 mg | ORAL_TABLET | Freq: Every day | ORAL | 3 refills | Status: DC
  Filled 2022-08-09 – 2022-08-10 (×2): qty 30, 30d supply, fill #0
  Filled 2022-11-03: qty 30, 30d supply, fill #1

## 2022-08-09 MED ORDER — ISOSORBIDE MONONITRATE ER 60 MG PO TB24
60.0000 mg | ORAL_TABLET | Freq: Every day | ORAL | 3 refills | Status: DC
  Filled 2022-08-09: qty 30, 30d supply, fill #0
  Filled 2022-11-03: qty 30, 30d supply, fill #1
  Filled 2023-06-06: qty 30, 30d supply, fill #2

## 2022-08-09 MED ORDER — FUROSEMIDE 40 MG PO TABS
80.0000 mg | ORAL_TABLET | Freq: Every day | ORAL | 3 refills | Status: DC
  Filled 2022-08-09: qty 60, 30d supply, fill #0
  Filled 2022-11-03: qty 60, 30d supply, fill #1
  Filled 2023-06-06: qty 60, 30d supply, fill #2

## 2022-08-09 MED ORDER — FUROSEMIDE 40 MG PO TABS
80.0000 mg | ORAL_TABLET | Freq: Every day | ORAL | 3 refills | Status: DC
Start: 2022-08-09 — End: 2022-08-09
  Filled 2022-08-09: qty 60, 30d supply, fill #0

## 2022-08-09 MED ORDER — POTASSIUM CHLORIDE CRYS ER 20 MEQ PO TBCR
20.0000 meq | EXTENDED_RELEASE_TABLET | Freq: Every day | ORAL | 3 refills | Status: DC
  Filled 2022-08-09: qty 90, 90d supply, fill #0

## 2022-08-09 MED ORDER — HYDRALAZINE HCL 50 MG PO TABS
75.0000 mg | ORAL_TABLET | Freq: Three times a day (TID) | ORAL | 3 refills | Status: DC
  Filled 2022-08-09: qty 135, 30d supply, fill #0

## 2022-08-09 MED ORDER — CARVEDILOL 12.5 MG PO TABS
12.5000 mg | ORAL_TABLET | Freq: Two times a day (BID) | ORAL | 3 refills | Status: DC
  Filled 2022-08-09: qty 60, 30d supply, fill #0
  Filled 2022-11-03: qty 60, 30d supply, fill #1
  Filled 2023-06-06: qty 60, 30d supply, fill #2

## 2022-08-09 MED ORDER — POTASSIUM CHLORIDE CRYS ER 20 MEQ PO TBCR
20.0000 meq | EXTENDED_RELEASE_TABLET | Freq: Every day | ORAL | 3 refills | Status: DC
Start: 2022-08-09 — End: 2022-08-09
  Filled 2022-08-09: qty 30, 30d supply, fill #0

## 2022-08-09 MED ORDER — FUROSEMIDE 40 MG PO TABS
80.0000 mg | ORAL_TABLET | Freq: Every day | ORAL | 3 refills | Status: DC
  Filled 2022-08-09: qty 60, 30d supply, fill #0

## 2022-08-09 MED ORDER — APIXABAN 5 MG PO TABS
5.0000 mg | ORAL_TABLET | Freq: Two times a day (BID) | ORAL | 11 refills | Status: DC
  Filled 2022-08-09: qty 60, 30d supply, fill #0

## 2022-08-09 NOTE — Progress Notes (Signed)
ReDS Vest / Clip - 08/09/22 1400       ReDS Vest / Clip   Station Marker C    Ruler Value 30    ReDS Value Range High volume overload    ReDS Actual Value 41

## 2022-08-09 NOTE — Progress Notes (Signed)
ADVANCED HF CLINIC NOTE  Primary Care: Jeanie Sewer, NP HF Cardiologist: Dr. Haroldine Laws  HPI: [name] is a 66 y.o. male w/ history of poorly controlled HTN, ETOH use, tobacco use and cocaine use, and recently diagnosed with HFrEF.   Admitted to [facility] 1/23 with Acute HFrEF.  Echo showed moderately reduced LVEF 30 to 35%, mod LVH w/ GIIDD, normal RV. Chest CT also showed thoracic aortic aneurysm. Aortic root measures 4.7 to 4.9 cm. Coronary atherosclerosis also noted. UDS negative. Diuresed w/ IV Lasix. Did not undergo LHC given renal insufficiency, which also limited medical therapy. SCr 2.2 (baseline unknown). No ARNi/ARB/spiro/dig. Placed on Imdur/hydarl, coreg and farxiga. D/c wt 249 lb.   He was seen in the HF [facility] 1/23 and referred to the [facility]. Hypertensive at the visit. Coreg and hydralazine increased. Also started on farxiga.   Follow up 2/23 in AHF clinic, found to be in AF, rate controlled. Eliquis started and referred to AF clinic to discuss DCCV. Seen in AF clinic 2/23, planned 3 weeks of correct Eliquis use then arrange DCCV.   Lost to follow up. No show for follow up with Dr. Haroldine Laws 3/23.  Today he returns for HF follow up. Overall feeling more SOB and fatigued for the past couple of months. He is SOB walking up stairs, does OK walking on flat ground. Notices abdominal swelling. Denies palpitations, CP, dizziness, or PND/Orthopnea. Appetite ok. No fever or chills. He does not weigh in at home. Taking all medications, except out of Eliquis x 2 months. Friend says he snores. Has home sleep study, has not completed. No ETOH use x 13 years, last used cocaine 2 months.No longer smoking.   Cardiac Testing   - Echo 10/19/21 EF 30-35%. Global HK, moderate ventricular hypertrophy, grade II DD, RV normal Aneurysm of the ascending aorta, measuring 43 mm.   Past Medical History:  Diagnosis Date   Acute congestive heart failure ([place]) 10/19/2021   Ascending  aortic aneurysm ([place]) 11/23/2021   4.9 cm in January 2023   Chronic kidney disease, stage 3a ([place]) 10/19/2021   Cocaine abuse ([place]) 10/19/2021   Elevated troponin level not due myocardial infarction 10/19/2021   Essential hypertension 10/19/2021   Nicotine dependence, cigarettes, uncomplicated 11/23/8339   Current Outpatient Medications  Medication Sig Dispense Refill   carvedilol (COREG) 12.5 MG tablet Take 12.5 mg by mouth in the morning, at noon, and at bedtime.     dapagliflozin propanediol (FARXIGA) 10 MG TABS tablet Take 1 tablet (10 mg total) by mouth daily. 30 tablet 3   furosemide (LASIX) 40 MG tablet Take 1 tablet (40 mg total) by mouth daily. 30 tablet 3   hydrALAZINE (APRESOLINE) 50 MG tablet Take 1.5 tablets (75 mg total) by mouth every 8 (eight) hours. 135 tablet 3   isosorbide mononitrate (IMDUR) 60 MG 24 hr tablet Take 1 tablet (60 mg total) by mouth daily. 30 tablet 3   Multiple Vitamin (MULTIVITAMIN) capsule Take 1 capsule by mouth as needed. Centrum Silver for men     nitroGLYCERIN (NITROSTAT) 0.4 MG SL tablet Place 1 tablet (0.4 mg total) under the tongue every 5 (five) minutes as needed for chest pain. 30 tablet 12   No current facility-administered medications for this encounter.   No Known Allergies  Social History   Socioeconomic History   Marital status: Divorced    Spouse name: Not on file   Number of children: 2   Years of education: Not on file  Highest education level: Associate degree: academic program  Occupational History   Occupation: retired  Tobacco Use   Smoking status: Former    Packs/day: 1.00    Years: 20.00    Total pack years: 20.00    Types: Cigarettes    Quit date: 10/17/2021    Years since quitting: 0.8   Smokeless tobacco: Never  Vaping Use   Vaping Use: Never used  Substance and Sexual Activity   Alcohol use: Not Currently    Comment: heavy drinker x 7 years, quit 11 years ago.   Drug use: Not Currently    Types: Cocaine    Comment:  1-2x/mo last use 10/09/2021   Sexual activity: Not Currently  Other Topics Concern   Not on file  Social History Narrative   Not on file   Social Determinants of Health   Financial Resource Strain: Low Risk  (07/25/2022)   Overall Financial Resource Strain (CARDIA)    Difficulty of Paying Living Expenses: Not very hard  Food Insecurity: No Food Insecurity (07/25/2022)   Hunger Vital Sign    Worried About Running Out of Food in the Last Year: Never true    Ran Out of Food in the Last Year: Never true  Transportation Needs: No Transportation Needs (07/25/2022)   PRAPARE - Hydrologist (Medical): No    Lack of Transportation (Non-Medical): No  Physical Activity: Inactive (07/25/2022)   Exercise Vital Sign    Days of Exercise per Week: 0 days    Minutes of Exercise per Session: 0 min  Stress: No Stress Concern Present (07/25/2022)   [place]    Feeling of Stress : Only a little  Social Connections: Socially Isolated (07/25/2022)   Social Connection and Isolation Panel [NHANES]    Frequency of Communication with Friends and Family: More than three times a week    Frequency of Social Gatherings with Friends and Family: More than three times a week    Attends Religious Services: Never    Marine scientist or Organizations: No    Attends Archivist Meetings: Never    Marital Status: Divorced  Human resources officer Violence: Not At Risk (07/25/2022)   Humiliation, Afraid, Rape, and Kick questionnaire    Fear of Current or Ex-Partner: No    Emotionally Abused: No    Physically Abused: No    Sexually Abused: No   Family History  Problem Relation Age of Onset   Heart disease Neg Hx    BP (!) 178/104   Pulse 99   Wt 120 kg (264 lb 9.6 oz)   SpO2 94%   BMI 33.07 kg/m   Wt Readings from Last 3 Encounters:  08/09/22 120 kg (264 lb 9.6 oz)  05/17/22 120.7 kg (266 lb)  12/09/21  115.8 kg (255 lb 6.4 oz)   PHYSICAL EXAM: General:  NAD. No resp difficulty, walked into clinic HEENT: Normal Neck: Supple. JVP to jaw. Carotids 2+ bilat; no bruits. No lymphadenopathy or thryomegaly appreciated. Cor: PMI nondisplaced. Irregular rate & rhythm. No rubs, gallops or murmurs. Lungs: Clear Abdomen: distended, nontender.No hepatosplenomegaly. No bruits or masses. Good bowel sounds. Extremities: No cyanosis, clubbing, rash, 1+ BLE edema to knees Neuro: Alert & oriented x 3, cranial nerves grossly intact. Moves all 4 extremities w/o difficulty. Affect pleasant.  ECG (personally reviewed): atrial fibrillation w/ PVC 93 bpm  ReDs: 41%  ASSESSMENT & PLAN: Acute on chronic Systolic Heart  Failure -  Echo 1/23: EF 30 to 35%, mod LVH w/ GIIDD, normal RV (no prior study for comparison) -  Etiology uncertain. 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 He will need cath if renal fx improves (chest CT + coronary calcifications, multiple RFs, ETOH and cocaine may also contribute) - NYHA II- early III, volume up today, ReDs 41% - Increase Lasix to 80 mg daily. Add 20 KCL daily. - Continue carvedilol 12.5 mg bid. Will not increase today with volume overload. - Continue hydralazine 75 mg tid + Imdur 60 mg daily. - Continue Farxiga 10 mg daily.  - No ARNi/ARB/Spiro/dig w/ CKD - Labs today. Repeat BMET in 10 days. - Plan to repeat echo when GDMT optimized.  2. Atrial fibrillation, persistent - Denies palpitations. AF on ECG today. He does not tolerate AF well. - CHA2DS2-VASc Score: at least 3  - He has been off Eliquis x 2 months - Re-start Eliquis 5 mg bid. Pharmacy helping with application for patient assistance. - Needs to complete home sleep study. - He has follow up with AF clinic in 2 days. Hopefully can arrange DCCV in 3 weeks when he gets back on Eliquis.   3. Hypertension  - Long h/o poor control. - Elevated today. - Diurese as above.  - I asked him to check  BP at home and log. - Increase hydralazine or beta blocker next.   4. CKD3 - Last SCr 2 - ? Hypertensive nephropathy. BP control per above.  - Continue Farxiga 10 mg daily. - BMET today.   5. Thoracic aortic aneurysm - aortic root measured 4.7 to 4.9 cm on noncontrast chest CT 1/23. - Had follow up with Dr. Skipper Cliche, planning repeat CT chest in 6 months. - Needs tight BP control - We discussed adding statin today. He is overwhelmed with his new medical conditions and meds, and wants to hold off. - Check lipids.  6. Cocaine abuse - Last used 2 months ago - Advised complete cessation.  7. Tobacco abuse - Quit from cigarettes - Congratulated.  8. Snoring - He has home sleep study, needs to complete this.  Follow up in 3 weeks with APP and 3 months with Dr. Haroldine Laws.  Allena Katz, FNP-BC 08/09/22

## 2022-08-09 NOTE — Addendum Note (Signed)
Encounter addended by: Kerry Dory, CMA on: 08/09/2022 3:12 PM  Actions taken: Pharmacy for encounter modified, Order list changed

## 2022-08-09 NOTE — Patient Instructions (Addendum)
RESTART Eliquis 5 mg one tab twice a day Please contact [facility]  [phone] for shipment if needed  INCREASE Lasix to 80 mg daily  START Potassium 20 meq one tab daily  Labs today We will only contact you if something comes back abnormal or we need to make some changes. Otherwise no news is good news!  Labs needed in 10-14 days  Please be sure to complete home sleep study. See instructions below. Your provider has recommended that you have a home sleep study.  We have provided you with the equipment in our office today. Please download the app and follow the instructions. YOUR PIN NUMBER IS: [id number]. Once you have completed the test you just dispose of the equipment, the information is automatically uploaded to [place] via blue-tooth technology. If your test is positive for sleep apnea and you need a home CPAP machine you will be contacted by Dr Theodosia Blender office [facility]) to set this up.  Your physician has requested that you regularly monitor and record your blood pressure readings at home. Please use the same machine at the same time of day to check your readings and record them to bring to your follow-up visit.  Your physician recommends that you schedule a follow-up appointment in: 3-4 weeks  in the Advanced Practitioners (PA/NP) Clinic and in 3-4 months with Dr Haroldine Laws  Do the following things EVERYDAY: Weigh yourself in the morning before breakfast. Write it down and keep it in a log. Take your medicines as prescribed Eat low salt foods--Limit salt (sodium) to 2000 mg per day.  Stay as active as you can everyday Limit all fluids for the day to less than 2 liters  At the [facility], you and your health needs are our priority. As part of our continuing mission to provide you with exceptional heart care, we have created designated Provider Care Teams. These Care Teams include your primary Cardiologist (physician) and Advanced Practice Providers (APPs- Physician  Assistants and Nurse Practitioners) who all work together to provide you with the care you need, when you need it.   You may see any of the following providers on your designated Care Team at your next follow up: Dr Glori Bickers Dr Loralie Champagne Dr. Roxana Hires, NP Lyda Jester, [place] [facility] [place], [place] Forestine Na, NP Audry Riles, PharmD   Please be sure to bring in all your medications bottles to every appointment.

## 2022-08-09 NOTE — Addendum Note (Signed)
Encounter addended by: Kerry Dory, CMA on: 08/09/2022 3:25 PM  Actions taken: Pharmacy for encounter modified, Order list changed

## 2022-08-09 NOTE — Telephone Encounter (Signed)
Advanced Heart Failure Patient Advocate Encounter  Sent in application via fax. Document scanned to chart.   Will follow up.  

## 2022-08-09 NOTE — Telephone Encounter (Signed)
Advanced Heart Failure Patient Advocate Encounter  Will apply for patient assistance from [place] for [place] (patient has medicare a/b but not part d). Patient aware samples are at the clinic while we wait for a determination.

## 2022-08-10 ENCOUNTER — Other Ambulatory Visit (HOSPITAL_COMMUNITY): Payer: Self-pay

## 2022-08-11 ENCOUNTER — Encounter (HOSPITAL_COMMUNITY): Payer: Self-pay | Admitting: Nurse Practitioner

## 2022-08-11 ENCOUNTER — Ambulatory Visit (HOSPITAL_COMMUNITY)
Admission: RE | Admit: 2022-08-11 | Discharge: 2022-08-11 | Disposition: A | Discharge status: Home / Self Care | Payer: Medicare Other | Source: Ambulatory Visit | Attending: Nurse Practitioner | Admitting: Nurse Practitioner

## 2022-08-11 VITALS — BP 180/100 | Ht 75.0 in | Wt 264.6 lb

## 2022-08-11 DIAGNOSIS — I509 Heart failure, unspecified: Secondary | ICD-10-CM | POA: Insufficient documentation

## 2022-08-11 DIAGNOSIS — Z87891 Personal history of nicotine dependence: Secondary | ICD-10-CM | POA: Diagnosis not present

## 2022-08-11 DIAGNOSIS — I4891 Unspecified atrial fibrillation: Secondary | ICD-10-CM | POA: Diagnosis not present

## 2022-08-11 DIAGNOSIS — I110 Hypertensive heart disease with heart failure: Secondary | ICD-10-CM | POA: Diagnosis not present

## 2022-08-11 DIAGNOSIS — Z7901 Long term (current) use of anticoagulants: Secondary | ICD-10-CM | POA: Insufficient documentation

## 2022-08-11 DIAGNOSIS — Z79899 Other long term (current) drug therapy: Secondary | ICD-10-CM | POA: Diagnosis not present

## 2022-08-11 DIAGNOSIS — I7121 Aneurysm of the ascending aorta, without rupture: Secondary | ICD-10-CM | POA: Diagnosis not present

## 2022-08-11 DIAGNOSIS — I4819 Other persistent atrial fibrillation: Secondary | ICD-10-CM | POA: Diagnosis not present

## 2022-08-11 NOTE — Progress Notes (Signed)
Primary Care Physician: Jeanie Sewer, NP Referring Physician: Allena Katz, FNP   Timothy Moon is a 66 y.o. male with a h/o of poorly controlled HTN, ETOH use, tobacco use and cocaine use, and recently diagnosed with HFrEF.   Admitted to [facility] 1/23 with Acute HFrEF.  Echo showed moderately reduced LVEF 30 to 35%, mod LVH w/ GIIDD, normal RV. Chest CT also showed thoracic aortic aneurysm. Aortic root measures 4.7 to 4.9 cm. Coronary atherosclerosis also noted. UDS negative. Diuresed w/ IV Lasix. Did not undergo LHC given renal insufficiency, which also limited medical therapy. SCr 2.2 (baseline unknown). No ARNi/ARB/spiro/dig. Placed on Imdur/hydarl, coreg and farxiga. D/c wt 249 lb.   He was seen in the HF [facility] 1/23 and referred to the [facility]. Hypertensive at the visit. Coreg and hydralazine increased. Also started on farxiga. - Echo 10/19/21 EF 30-35%. Global HK, moderate ventricular hypertrophy, grade II DD, RV normal Aneurysm of the ascending aorta, measuring 43 mm.   He was seen by Allena Katz, FNP, 11/24/21 and was started on eliquis 5 mg bid and referred to afib clinic to discuss cardioversion. However, he states today that he has only been taking  eliquis 1x daily since 2/9 . His HR's at home in afib  are in the 60-80's.   He is here with his friend today. He states no recent cocaine. Denies alcohol use. He does not appear to be symptomatic with the afib. States compliance with BB. Recently increased.   F/u in afib clinic, 08/11/22. I have not seen since February at which time he was suppose to see me back in 2 weeks to have a cardioversion. He missed Dr. Clayborne Dana visit around that time as well. He recently aw Allena Katz, NP, 10/24 and was still in afib. He is here now to see about obtaining cardioversion. In the meantime, pt states that he has been feeling all right. He has a rx for eliquis thru the mail coming to him, but he was given samples today to  start back tonight. After 21 days on anticoagulation, we discussed going on to have a cardioversion. He is in agreement.     Today, he denies symptoms of palpitations, chest pain, shortness of breath, orthopnea, PND, lower extremity edema, dizziness, presyncope, syncope, or neurologic sequela. The patient is tolerating medications without difficulties and is otherwise without complaint today.   Past Medical History:  Diagnosis Date   Acute congestive heart failure ([place]) 10/19/2021   Ascending aortic aneurysm ([place]) 11/23/2021   4.9 cm in January 2023   Chronic kidney disease, stage 3a ([place]) 10/19/2021   Cocaine abuse ([place]) 10/19/2021   Elevated troponin level not due myocardial infarction 10/19/2021   Essential hypertension 10/19/2021   Nicotine dependence, cigarettes, uncomplicated [id number]   No past surgical history on file.  Current Outpatient Medications  Medication Sig Dispense Refill   apixaban (ELIQUIS) 5 MG TABS tablet Take 1 tablet (5 mg total) by mouth 2 (two) times daily. 60 tablet 11   carvedilol (COREG) 12.5 MG tablet Take 1 tablet (12.5 mg total) by mouth 2 (two) times daily with a meal. 60 tablet 3   dapagliflozin propanediol (FARXIGA) 10 MG TABS tablet Take 1 tablet (10 mg total) by mouth daily. 30 tablet 3   furosemide (LASIX) 40 MG tablet Take 2 tablets (80 mg total) by mouth daily. 60 tablet 3   hydrALAZINE (APRESOLINE) 50 MG tablet Take 1.5 tablets (75 mg total) by mouth every 8 (eight) hours. [place]  tablet 3   isosorbide mononitrate (IMDUR) 60 MG 24 hr tablet Take 1 tablet (60 mg total) by mouth daily. 30 tablet 3   Multiple Vitamin (MULTIVITAMIN) capsule Take 1 capsule by mouth as needed. Centrum Silver for men     nitroGLYCERIN (NITROSTAT) 0.4 MG SL tablet Place 1 tablet (0.4 mg total) under the tongue every 5 (five) minutes as needed for chest pain. 30 tablet 12   potassium chloride SA (KLOR-CON M) 20 MEQ tablet Take 1 tablet (20 mEq total) by mouth daily. 30 tablet 3   No  current facility-administered medications for this encounter.    No Known Allergies  Social History   Socioeconomic History   Marital status: Divorced    Spouse name: Not on file   Number of children: 2   Years of education: Not on file   Highest education level: Associate degree: academic program  Occupational History   Occupation: retired  Tobacco Use   Smoking status: Former    Packs/day: 1.00    Years: 20.00    Total pack years: 20.00    Types: Cigarettes    Quit date: 10/17/2021    Years since quitting: 0.8   Smokeless tobacco: Never  Vaping Use   Vaping Use: Never used  Substance and Sexual Activity   Alcohol use: Not Currently    Comment: heavy drinker x 7 years, quit 11 years ago.   Drug use: Not Currently    Types: Cocaine    Comment: 1-2x/mo last use 10/09/2021   Sexual activity: Not Currently  Other Topics Concern   Not on file  Social History Narrative   Not on file   Social Determinants of Health   Financial Resource Strain: Low Risk  (07/25/2022)   Overall Financial Resource Strain (CARDIA)    Difficulty of Paying Living Expenses: Not very hard  Food Insecurity: No Food Insecurity (07/25/2022)   Hunger Vital Sign    Worried About Running Out of Food in the Last Year: Never true    Ran Out of Food in the Last Year: Never true  Transportation Needs: No Transportation Needs (07/25/2022)   PRAPARE - Hydrologist (Medical): No    Lack of Transportation (Non-Medical): No  Physical Activity: Inactive (07/25/2022)   Exercise Vital Sign    Days of Exercise per Week: 0 days    Minutes of Exercise per Session: 0 min  Stress: No Stress Concern Present (07/25/2022)   [place]    Feeling of Stress : Only a little  Social Connections: Socially Isolated (07/25/2022)   Social Connection and Isolation Panel [NHANES]    Frequency of Communication with Friends and Family: More  than three times a week    Frequency of Social Gatherings with Friends and Family: More than three times a week    Attends Religious Services: Never    Marine scientist or Organizations: No    Attends Archivist Meetings: Never    Marital Status: Divorced  Human resources officer Violence: Not At Risk (07/25/2022)   Humiliation, Afraid, Rape, and Kick questionnaire    Fear of Current or Ex-Partner: No    Emotionally Abused: No    Physically Abused: No    Sexually Abused: No    Family History  Problem Relation Age of Onset   Heart disease Neg Hx     ROS- All systems are reviewed and negative except as per the HPI above  Physical Exam: Vitals:   08/11/22 1458  BP: (!) 180/100  Weight: 120 kg  Height: 6\' 3"  (1.905 m)   Wt Readings from Last 3 Encounters:  08/11/22 120 kg  08/09/22 120 kg  05/17/22 120.7 kg    Labs: Lab Results  Component Value Date   NA 143 08/09/2022   K 4.3 08/09/2022   CL 111 08/09/2022   CO2 23 08/09/2022   GLUCOSE 97 08/09/2022   BUN 33 (H) 08/09/2022   CREATININE 1.82 (H) 08/09/2022   CALCIUM 9.7 08/09/2022   PHOS 3.7 10/20/2021   MG 2.5 (H) 10/20/2021   No results found for: "INR" Lab Results  Component Value Date   CHOL 104 11/24/2021   HDL 29 (L) 11/24/2021   LDLCALC 56 11/24/2021   TRIG 93 11/24/2021     GEN- The patient is well appearing, alert and oriented x 3 today.   Head- normocephalic, atraumatic Eyes-  Sclera clear, conjunctiva pink Ears- hearing intact Oropharynx- clear Neck- supple, no JVP Lymph- no cervical lymphadenopathy Lungs- Clear to ausculation bilaterally, normal work of breathing Heart- Regular rate and rhythm, no murmurs, rubs or gallops, PMI not laterally displaced GI- soft, NT, ND, + BS Extremities- no clubbing, cyanosis, or edema MS- no significant deformity or atrophy Skin- no rash or lesion Psych- euthymic mood, full affect Neuro- strength and sensation are intact  EKG-Vent. rate 93  BPM PR interval * ms QRS duration 96 ms QT/QTcB 360/447 ms P-R-T axes * 97 253 Atrial fibrillation with premature ventricular or aberrantly conducted complexes Rightward axis ST T wave changes consider ischemia No significant change since last tracing Confirmed by Dorris Carnes [phone]) on 08/09/2022 8:59:40 PM     Assessment and Plan:  1. Persistent afib  Off anticoagulation for 2 months Continue carvedilol 12.5 mg bid  States at home he is rate controlled  Ekg reviewed form HF clinic 2 days ago and  rate controlled afib  2. CHA2DS2VASc score of at least 3 Instructed that eliquis has to be on board to lower stroke risk and to arrange for cardioversion safely Samples given and he will start tonight and I will see back in 2 weeks to set up cardioversion, if no misses   3. CHF Per HF  States his lasix was increased in HF 2 days ago for fluid and HTN  4. Ascending aortic aneurysm Recently evaluated by Dr. Roxan Hockey He is following   Timothy Moon. Timothy Moon, [facility] [address] [place], [place] [address] [phone]

## 2022-08-12 ENCOUNTER — Other Ambulatory Visit (HOSPITAL_COMMUNITY): Payer: Self-pay

## 2022-08-12 ENCOUNTER — Encounter (HOSPITAL_COMMUNITY): Payer: Self-pay

## 2022-08-12 ENCOUNTER — Telehealth (HOSPITAL_COMMUNITY): Payer: Self-pay | Admitting: Pharmacy Technician

## 2022-08-12 NOTE — Telephone Encounter (Signed)
Advanced Heart Failure Patient Advocate Encounter  Patient is currently active with BMS assistance through 10/16/22. Think there may have been some confusion on where to get the medication from. Called and spoke with the patient. Provided phone number to BMS ([phone]). Patient stated he has been without Eliquis for 2 months. BMS automatically ships, asked the patient how he was getting the medication before. He stated that [facility] came [facility]. Patient should call [employer]MS back and confirm delivery information. He will at least get one 90 day supply before the eligibility end date.  Charlann Boxer, CPhT

## 2022-08-16 NOTE — Telephone Encounter (Signed)
Advanced Heart Failure Patient Advocate Encounter   Patient was approved to receive Farxiga from [place] Patient ID: [id number] Effective: 08/15/22 through 10/17/2023  Determination letter has been added to patient chart. Attempt to call patient went unanswered, voice mail box is not set up.  Clista Bernhardt, CPhT Rx Patient Advocate Phone: [phone]

## 2022-08-19 ENCOUNTER — Other Ambulatory Visit (HOSPITAL_COMMUNITY): Payer: Self-pay

## 2022-08-19 ENCOUNTER — Ambulatory Visit (HOSPITAL_COMMUNITY)
Admission: RE | Admit: 2022-08-19 | Discharge: 2022-08-19 | Disposition: A | Discharge status: Home / Self Care | Payer: Medicare Other | Source: Ambulatory Visit | Attending: Cardiology | Admitting: Cardiology

## 2022-08-19 DIAGNOSIS — I10 Essential (primary) hypertension: Secondary | ICD-10-CM

## 2022-08-19 LAB — BASIC METABOLIC PANEL
Anion gap: 8 (ref 5–15)
BUN: 31 mg/dL — ABNORMAL HIGH (ref 8–23)
CO2: 25 mmol/L (ref 22–32)
Calcium: 9.6 mg/dL (ref 8.9–10.3)
Chloride: 108 mmol/L (ref 98–111)
Creatinine, Ser: 1.97 mg/dL — ABNORMAL HIGH (ref 0.61–1.24)
GFR, Estimated: 37 mL/min — ABNORMAL LOW (ref >60)
Glucose, Bld: 87 mg/dL (ref 70–99)
Potassium: 3.9 mmol/L (ref 3.5–5.1)
Sodium: 141 mmol/L (ref 135–145)

## 2022-08-19 MED ORDER — APIXABAN 5 MG PO TABS: 5.0000 mg | ORAL_TABLET | Freq: Two times a day (BID) | ORAL | 0 refills | Status: DC

## 2022-08-25 ENCOUNTER — Ambulatory Visit (HOSPITAL_COMMUNITY)
Admission: RE | Admit: 2022-08-25 | Discharge: 2022-08-25 | Disposition: A | Discharge status: Home / Self Care | Payer: Medicare Other | Source: Ambulatory Visit | Attending: Nurse Practitioner | Admitting: Nurse Practitioner

## 2022-08-25 VITALS — BP 130/72 | HR 111 | Ht 75.0 in | Wt 261.8 lb

## 2022-08-25 DIAGNOSIS — Z79899 Other long term (current) drug therapy: Secondary | ICD-10-CM | POA: Diagnosis not present

## 2022-08-25 DIAGNOSIS — D6869 Other thrombophilia: Secondary | ICD-10-CM | POA: Diagnosis not present

## 2022-08-25 DIAGNOSIS — I4819 Other persistent atrial fibrillation: Secondary | ICD-10-CM | POA: Diagnosis not present

## 2022-08-25 DIAGNOSIS — Z7901 Long term (current) use of anticoagulants: Secondary | ICD-10-CM | POA: Insufficient documentation

## 2022-08-25 DIAGNOSIS — I130 Hypertensive heart and chronic kidney disease with heart failure and stage 1 through stage 4 chronic kidney disease, or unspecified chronic kidney disease: Secondary | ICD-10-CM | POA: Diagnosis not present

## 2022-08-25 DIAGNOSIS — I7121 Aneurysm of the ascending aorta, without rupture: Secondary | ICD-10-CM | POA: Diagnosis not present

## 2022-08-25 DIAGNOSIS — Z87891 Personal history of nicotine dependence: Secondary | ICD-10-CM | POA: Insufficient documentation

## 2022-08-25 DIAGNOSIS — I4891 Unspecified atrial fibrillation: Secondary | ICD-10-CM

## 2022-08-25 DIAGNOSIS — N1831 Chronic kidney disease, stage 3a: Secondary | ICD-10-CM | POA: Insufficient documentation

## 2022-08-25 DIAGNOSIS — I5022 Chronic systolic (congestive) heart failure: Secondary | ICD-10-CM | POA: Insufficient documentation

## 2022-08-25 NOTE — Patient Instructions (Signed)
Cardioversion scheduled for Wednesday, November 22nd  - Come to afib clinic for labs at 930am  - Arrive at the [facility] and go to admitting at  - Do not eat or drink anything after midnight the night prior to your procedure.  - Take all your morning medication (except diabetic medications) with a sip of water prior to arrival.  - You will not be able to drive home after your procedure.  - Do NOT miss any doses of your blood thinner - if you should miss a dose please notify our office immediately.  - If you feel as if you go back into normal rhythm prior to scheduled cardioversion, please notify our office immediately. If your procedure is canceled in the cardioversion suite you will be charged a cancellation fee.

## 2022-08-25 NOTE — Progress Notes (Signed)
Primary Care Physician: Timothy Sellar, NP Referring Physician: [name], FNP   Timothy Moon is a 66 y.o. male with a h/o of poorly controlled HTN, ETOH use, tobacco use and cocaine use, and recently diagnosed with HFrEF.   Admitted to [facility] 1/23 with Acute HFrEF.  Echo showed moderately reduced LVEF 30 to 35%, mod LVH w/ GIIDD, normal RV. Chest CT also showed thoracic aortic aneurysm. Aortic root measures 4.7 to 4.9 cm. Coronary atherosclerosis also noted. UDS negative. Diuresed w/ IV Lasix. Did not undergo LHC given renal insufficiency, which also limited medical therapy. SCr 2.2 (baseline unknown). No ARNi/ARB/spiro/dig. Placed on Imdur/hydarl, coreg and farxiga. D/c wt 249 lb.   He was seen in the HF Timothy Moon 1/23 and referred to the [facility]. Hypertensive at the visit. Coreg and hydralazine increased. Also started on farxiga. - Echo 10/19/21 EF 30-35%. Global HK, moderate ventricular hypertrophy, grade II DD, RV normal Aneurysm of the ascending aorta, measuring 43 mm.   He was seen by Timothy Rome, FNP, 11/24/21 and was started on eliquis 5 mg bid and referred to afib clinic to discuss cardioversion. However, he states today that he has only been taking  eliquis 1x daily since 2/9 . His HR's at home in afib  are in the 60-80's.   He is here with his friend today. He states no recent cocaine. Denies alcohol use. He does not appear to be symptomatic with the afib. States compliance with BB. Recently increased.   F/u in afib clinic, 08/11/22. I have not seen since February at which time he was suppose to see me back in 2 weeks to have a cardioversion. He missed Timothy Moon visit around that time as well. He recently aw Timothy Rome, NP, 10/24 and was still in afib. He is here now to see about obtaining cardioversion. In the meantime, pt states that he has been feeling all right. He has a rx for eliquis thru the mail coming to him, but he was given samples today to  start back tonight. After 21 days on anticoagulation, we discussed going on to have a cardioversion. He is in agreement.   F/u in afib clinic, pt states that he has been back on eliquis 5 mg bid since 10/27. No missed doses since then. He remains in afib at 111 bpm. Fluid status stable. He wants to proceed with cardioversion.    Today, he denies symptoms of palpitations, chest pain, shortness of breath, orthopnea, PND, lower extremity edema, dizziness, presyncope, syncope, or neurologic sequela. The patient is tolerating medications without difficulties and is otherwise without complaint today.   Past Medical History:  Diagnosis Date   Acute congestive heart failure (HCC) 10/19/2021   Ascending aortic aneurysm (H[facility]) 11/23/2021   4.9 cm in January 2023   Chronic kidney disease, stage 3a (H[facility]) 10/19/2021   Cocaine abuse (H[facility]) 10/19/2021   Elevated troponin level not due myocardial infarction 10/19/2021   Essential hypertension 10/19/2021   Nicotine dependence, cigarettes, uncomplicated 10/19/2021   No past surgical history on file.  Current Outpatient Medications  Medication Sig Dispense Refill   apixaban (ELIQUIS) 5 MG TABS tablet Take 1 tablet (5 mg total) by mouth 2 (two) times daily. 14 tablet 0   carvedilol (COREG) 12.5 MG tablet Take 1 tablet (12.5 mg total) by mouth 2 (two) times daily with a meal. 60 tablet 3   dapagliflozin propanediol (FARXIGA) 10 MG TABS tablet Take 1 tablet (10 mg total) by mouth daily. 30 tablet  3   furosemide (LASIX) 40 MG tablet Take 2 tablets (80 mg total) by mouth daily. 60 tablet 3   hydrALAZINE (APRESOLINE) 50 MG tablet Take 1.5 tablets (75 mg total) by mouth every 8 (eight) hours. 135 tablet 3   isosorbide mononitrate (IMDUR) 60 MG 24 hr tablet Take 1 tablet (60 mg total) by mouth daily. 30 tablet 3   Multiple Vitamin (MULTIVITAMIN) capsule Take 1 capsule by mouth as needed. Centrum Silver for men     nitroGLYCERIN (NITROSTAT) 0.4 MG SL tablet Place 1 tablet (0.4 mg  total) under the tongue every 5 (five) minutes as needed for chest pain. 30 tablet 12   potassium chloride SA (KLOR-CON M) 20 MEQ tablet Take 1 tablet (20 mEq total) by mouth daily. 30 tablet 3   No current facility-administered medications for this encounter.    No Known Allergies  Social History   Socioeconomic History   Marital status: Divorced    Spouse name: Not on file   Number of children: 2   Years of education: Not on file   Highest education level: Associate degree: academic program  Occupational History   Occupation: retired  Tobacco Use   Smoking status: Former    Packs/day: 1.00    Years: 20.00    Total pack years: 20.00    Types: Cigarettes    Quit date: 10/17/2021    Years since quitting: 0.8   Smokeless tobacco: Never  Vaping Use   Vaping Use: Never used  Substance and Sexual Activity   Alcohol use: Not Currently    Comment: heavy drinker x 7 years, quit 11 years ago.   Drug use: Not Currently    Types: Cocaine    Comment: 1-2x/mo last use 10/09/2021   Sexual activity: Not Currently  Other Topics Concern   Not on file  Social History Narrative   Not on file   Social Determinants of Health   Financial Resource Strain: Low Risk  (07/25/2022)   Overall Financial Resource Strain (CARDIA)    Difficulty of Paying Living Expenses: Not very hard  Food Insecurity: No Food Insecurity (07/25/2022)   Hunger Vital Sign    Worried About Running Out of Food in the Last Year: Never true    Ran Out of Food in the Last Year: Never true  Transportation Needs: No Transportation Needs (07/25/2022)   PRAPARE - Administrator, Civil Service (Medical): No    Lack of Transportation (Non-Medical): No  Physical Activity: Inactive (07/25/2022)   Exercise Vital Sign    Days of Exercise per Week: 0 days    Minutes of Exercise per Session: 0 min  Stress: No Stress Concern Present (07/25/2022)   Timothy Moon of Occupational Health - Occupational Stress  Questionnaire    Feeling of Stress : Only a little  Social Connections: Socially Isolated (07/25/2022)   Social Connection and Isolation Panel [NHANES]    Frequency of Communication with Friends and Family: More than three times a week    Frequency of Social Gatherings with Friends and Family: More than three times a week    Attends Religious Services: Never    Database administrator or Organizations: No    Attends Banker Meetings: Never    Marital Status: Divorced  Catering manager Violence: Not At Risk (07/25/2022)   Humiliation, Afraid, Rape, and Kick questionnaire    Fear of Current or Ex-Partner: No    Emotionally Abused: No    Physically Abused: No  Sexually Abused: No    Family History  Problem Relation Age of Onset   Heart disease Neg Hx     ROS- All systems are reviewed and negative except as per the HPI above  Physical Exam: Vitals:   08/25/22 1445  BP: 130/72  Pulse: (!) 111  Weight: 118.8 kg  Height: 6\' 3"  (1.905 m)   Wt Readings from Last 3 Encounters:  08/25/22 118.8 kg  08/11/22 120 kg  08/09/22 120 kg    Labs: Lab Results  Component Value Date   NA 141 08/19/2022   K 3.9 08/19/2022   CL 108 08/19/2022   CO2 25 08/19/2022   GLUCOSE 87 08/19/2022   BUN 31 (H) 08/19/2022   CREATININE 1.97 (H) 08/19/2022   CALCIUM 9.6 08/19/2022   PHOS 3.7 10/20/2021   MG 2.5 (H) 10/20/2021   No results found for: "INR" Lab Results  Component Value Date   CHOL 104 11/24/2021   HDL 29 (L) 11/24/2021   LDLCALC 56 11/24/2021   TRIG 93 11/24/2021     GEN- The patient is well appearing, alert and oriented x 3 today.   Head- normocephalic, atraumatic Eyes-  Sclera clear, conjunctiva pink Ears- hearing intact Oropharynx- clear Neck- supple, no JVP Lymph- no cervical lymphadenopathy Lungs- Clear to ausculation bilaterally, normal work of breathing Heart- irregular rate and rhythm, no murmurs, rubs or gallops, PMI not laterally displaced GI-  soft, NT, ND, + BS Extremities- no clubbing, cyanosis, or mild  edema MS- no significant deformity or atrophy Skin- no rash or lesion Psych- euthymic mood, full affect Neuro- strength and sensation are intact  EKG Vent. rate 111 BPM PR interval * ms QRS duration 96 ms QT/QTcB 354/481 ms P-R-T axes * 68 133 Atrial fibrillation with rapid ventricular response with premature ventricular or aberrantly conducted complexes T wave abnormality, consider lateral ischemia Abnormal ECG When compared with ECG of 09-Aug-2022 14:08, PREVIOUS ECG IS PRESENT    Assessment and Plan:  1. Persistent afib  Off anticoagulation for 2 months, now back on since 10/27 and will be eligible for cardioversion after 11/17 , scheduled for 11/22 Risk vrs benefit of cardioversion discussed  Continue carvedilol 12.5 mg bid  States at home he is rate controlled, elevated here at 111 bpm  2. CHA2DS2VASc score of at least 3 Instructed that eliquis has to be on board to lower stroke risk and to arrange for cardioversion safely He states compliance with  eliquis 5 mg bid since 10/27  Reminded not to miss doses for pending cardioversion   3. CHF Per HF States weight is stable   4. Ascending aortic aneurysm Recently evaluated by Dr. 11/27 He is following   Dorris Fetch. [facility] [address] [place], [place] [phone]

## 2022-09-01 ENCOUNTER — Ambulatory Visit (HOSPITAL_COMMUNITY)
Admission: RE | Admit: 2022-09-01 | Discharge: 2022-09-01 | Disposition: A | Discharge status: Home / Self Care | Payer: Medicare Other | Source: Ambulatory Visit | Attending: Family Medicine | Admitting: Family Medicine

## 2022-09-01 ENCOUNTER — Encounter (HOSPITAL_COMMUNITY): Payer: Self-pay

## 2022-09-01 ENCOUNTER — Other Ambulatory Visit (HOSPITAL_COMMUNITY): Payer: Self-pay

## 2022-09-01 VITALS — BP 178/102 | HR 92 | Wt 261.6 lb

## 2022-09-01 DIAGNOSIS — I130 Hypertensive heart and chronic kidney disease with heart failure and stage 1 through stage 4 chronic kidney disease, or unspecified chronic kidney disease: Secondary | ICD-10-CM | POA: Diagnosis present

## 2022-09-01 DIAGNOSIS — Z79899 Other long term (current) drug therapy: Secondary | ICD-10-CM | POA: Diagnosis not present

## 2022-09-01 DIAGNOSIS — I10 Essential (primary) hypertension: Secondary | ICD-10-CM

## 2022-09-01 DIAGNOSIS — F1410 Cocaine abuse, uncomplicated: Secondary | ICD-10-CM | POA: Diagnosis not present

## 2022-09-01 DIAGNOSIS — I4819 Other persistent atrial fibrillation: Secondary | ICD-10-CM | POA: Diagnosis not present

## 2022-09-01 DIAGNOSIS — Z7901 Long term (current) use of anticoagulants: Secondary | ICD-10-CM | POA: Diagnosis not present

## 2022-09-01 DIAGNOSIS — I4891 Unspecified atrial fibrillation: Secondary | ICD-10-CM

## 2022-09-01 DIAGNOSIS — N1831 Chronic kidney disease, stage 3a: Secondary | ICD-10-CM

## 2022-09-01 DIAGNOSIS — I712 Thoracic aortic aneurysm, without rupture, unspecified: Secondary | ICD-10-CM

## 2022-09-01 DIAGNOSIS — R0683 Snoring: Secondary | ICD-10-CM

## 2022-09-01 DIAGNOSIS — I5022 Chronic systolic (congestive) heart failure: Secondary | ICD-10-CM | POA: Insufficient documentation

## 2022-09-01 DIAGNOSIS — Z87891 Personal history of nicotine dependence: Secondary | ICD-10-CM | POA: Insufficient documentation

## 2022-09-01 LAB — BASIC METABOLIC PANEL
Anion gap: 11 (ref 5–15)
BUN: 32 mg/dL — ABNORMAL HIGH (ref 8–23)
CO2: 24 mmol/L (ref 22–32)
Calcium: 9.2 mg/dL (ref 8.9–10.3)
Chloride: 107 mmol/L (ref 98–111)
Creatinine, Ser: 1.86 mg/dL — ABNORMAL HIGH (ref 0.61–1.24)
GFR, Estimated: 39 mL/min — ABNORMAL LOW (ref >60)
Glucose, Bld: 101 mg/dL — ABNORMAL HIGH (ref 70–99)
Potassium: 3.5 mmol/L (ref 3.5–5.1)
Sodium: 142 mmol/L (ref 135–145)

## 2022-09-01 LAB — LIPID PANEL
Cholesterol: 121 mg/dL (ref 0–200)
HDL: 32 mg/dL — ABNORMAL LOW (ref >40)
LDL Cholesterol: 68 mg/dL (ref 0–99)
Total CHOL/HDL Ratio: 3.8 RATIO
Triglycerides: 106 mg/dL (ref <150)
VLDL: 21 mg/dL (ref 0–40)

## 2022-09-01 LAB — BRAIN NATRIURETIC PEPTIDE: B Natriuretic Peptide: 478.5 pg/mL — ABNORMAL HIGH (ref 0.0–100.0)

## 2022-09-01 MED ORDER — HYDRALAZINE HCL 100 MG PO TABS
100.0000 mg | ORAL_TABLET | Freq: Three times a day (TID) | ORAL | 3 refills | Status: DC
  Filled 2022-09-01: qty 90, 30d supply, fill #0

## 2022-09-01 MED ORDER — HYDRALAZINE HCL 100 MG PO TABS
100.0000 mg | ORAL_TABLET | Freq: Three times a day (TID) | ORAL | 3 refills | Status: DC
  Filled 2022-09-01 – 2022-11-03 (×2): qty 90, 30d supply, fill #0
  Filled 2023-06-06: qty 90, 30d supply, fill #1
  Filled 2023-08-23: qty 90, 30d supply, fill #2

## 2022-09-01 NOTE — Patient Instructions (Signed)
INCREASE Hydralazine to 100 mg one tab three times daily  Labs today We will only contact you if something comes back abnormal or we need to make some changes. Otherwise no news is good news!   Please be sure to complete home sleep study  Your physician wants you to follow-up in: 3 months with Dr Gala Romney and echo. You will receive a reminder letter in the mail two months in advance. If you don't receive a letter, please call our office to schedule the follow-up appointment.  Your physician has requested that you have an echocardiogram. Echocardiography is a painless test that uses sound waves to create images of your heart. It provides your doctor with information about the size and shape of your heart and how well your heart's chambers and valves are working. This procedure takes approximately one hour. There are no restrictions for this procedure. Please do NOT wear cologne, perfume, aftershave, or lotions (deodorant is allowed). Please arrive 15 minutes prior to your appointment time.  Do the following things EVERYDAY: Weigh yourself in the morning before breakfast. Write it down and keep it in a log. Take your medicines as prescribed Eat low salt foods--Limit salt (sodium) to 2000 mg per day.  Stay as active as you can everyday Limit all fluids for the day to less than 2 liters  At the [facility], you and your health needs are our priority. As part of our continuing mission to provide you with exceptional heart care, we have created designated Provider Care Teams. These Care Teams include your primary Cardiologist (physician) and Advanced Practice Providers (APPs- Physician Assistants and Nurse Practitioners) who all work together to provide you with the care you need, when you need it.   You may see any of the following providers on your designated Care Team at your next follow up: Dr Arvilla Meres Dr Marca Ancona Dr. Marcos Eke, NP Robbie Lis, [place] [facility] [place], [place] Brynda Peon, NP Karle Plumber, PharmD   Please be sure to bring in all your medications bottles to every appointment.   If you have any questions or concerns before your next appointment please send [place] a message through [place] or call our office at [phone].    TO LEAVE A MESSAGE FOR THE NURSE SELECT OPTION 2, PLEASE LEAVE A MESSAGE INCLUDING: YOUR NAME DATE OF BIRTH CALL BACK NUMBER REASON FOR CALL**this is important as we prioritize the call backs  YOU WILL RECEIVE A CALL BACK THE SAME DAY AS LONG AS YOU CALL BEFORE 4:00 PM

## 2022-09-01 NOTE — Progress Notes (Signed)
 ADVANCED HF CLINIC NOTE  Primary Care: Hudnell, Stephanie, NP HF Cardiologist: Dr. Bensimhon  HPI: Timothy Moon is a 66 y.o. male w/ history of poorly controlled HTN, ETOH use, tobacco use and cocaine use, and recently diagnosed with HFrEF.   Admitted to [facility] 1/23 with Acute HFrEF.  Echo showed moderately reduced LVEF 30 to 35%, mod LVH w/ GIIDD, normal RV. Chest CT also showed thoracic aortic aneurysm. Aortic root measures 4.7 to 4.9 cm. Coronary atherosclerosis also noted. UDS negative. Diuresed w/ IV Lasix. Did not undergo LHC given renal insufficiency, which also limited medical therapy. SCr 2.2 (baseline unknown). No ARNi/ARB/spiro/dig. Placed on Imdur/hydarl, coreg and farxiga. D/c wt 249 lb.   He was seen in the HF TOC 1/23 and [facility] to [facility]. Hypertensive at the visit. Coreg and hydralazine increased. Also started on Farxiga.   Follow up 2/23 in AHF clinic, found to be in AF, rate controlled. Eliquis started and referred to AF clinic to discuss DCCV. Seen in AF clinic 2/23, planned 3 weeks of correct Eliquis use then arrange DCCV.   Lost to follow up. No show for follow up with Dr. Bensimhon 3/23.  Follow up 10/23, worse NYHA III and volume overloaded. Lasix increased to 80 daily. Eliquis restarted, had follow up with AF clinic to arrange DCCV after 3 weeks back on AC.  Today he returns for HF follow up. Overall feeling fine. He is not SOB walking on flat ground. Gets winded going up stairs. Denies palpitations, abnormal bleeding, CP, dizziness, edema, or PND/Orthopnea. Appetite ok. No fever or chills. Weight at home 258-260 pounds. Taking all medications. BP at home 160's. No longer smoking, no ETOH in 13 years, no cocaine in a couple months. Has home sleep study but has not completed yet.   Cardiac Testing   - Echo (10/19/21): EF 30-35%. Global HK, moderate ventricular hypertrophy, grade II DD, RV normal Aneurysm of the ascending aorta, measuring 43  mm.   Past Medical History:  Diagnosis Date   Acute congestive heart failure (HCC) 10/19/2021   Ascending aortic aneurysm (HCC) 11/23/2021   4.9 cm in January 2023   Chronic kidney disease, stage 3a (HCC) 10/19/2021   Cocaine abuse (HCC) 10/19/2021   Elevated troponin level not due myocardial infarction 10/19/2021   Essential hypertension 10/19/2021   Nicotine dependence, cigarettes, uncomplicated 10/19/2021   Current Outpatient Medications  Medication Sig Dispense Refill   apixaban (ELIQUIS) 5 MG TABS tablet Take 1 tablet (5 mg total) by mouth 2 (two) times daily. 14 tablet 0   carvedilol (COREG) 12.5 MG tablet Take 1 tablet (12.5 mg total) by mouth 2 (two) times daily with a meal. 60 tablet 3   dapagliflozin propanediol (FARXIGA) 10 MG TABS tablet Take 1 tablet (10 mg total) by mouth daily. 30 tablet 3   furosemide (LASIX) 40 MG tablet Take 2 tablets (80 mg total) by mouth daily. 60 tablet 3   hydrALAZINE (APRESOLINE) 50 MG tablet Take 1.5 tablets (75 mg total) by mouth every 8 (eight) hours. 135 tablet 3   isosorbide mononitrate (IMDUR) 60 MG 24 hr tablet Take 1 tablet (60 mg total) by mouth daily. 30 tablet 3   Multiple Vitamin (MULTIVITAMIN) capsule Take 1 capsule by mouth as needed. Centrum Silver for men     nitroGLYCERIN (NITROSTAT) 0.4 MG SL tablet Place 1 tablet (0.4 mg total) under the tongue every 5 (five) minutes as needed for chest pain. 30 tablet 12   potassium chloride   SA (KLOR-CON M) 20 MEQ tablet Take 1 tablet (20 mEq total) by mouth daily. 30 tablet 3   No current facility-administered medications for this encounter.   No Known Allergies  Social History   Socioeconomic History   Marital status: Divorced    Spouse name: Not on file   Number of children: 2   Years of education: Not on file   Highest education level: Associate degree: academic program  Occupational History   Occupation: retired  Tobacco Use   Smoking status: Former    Packs/day: 1.00    Years: 20.00     Total pack years: 20.00    Types: Cigarettes    Quit date: 10/17/2021    Years since quitting: 0.8   Smokeless tobacco: Never  Vaping Use   Vaping Use: Never used  Substance and Sexual Activity   Alcohol use: Not Currently    Comment: heavy drinker x 7 years, quit 11 years ago.   Drug use: Not Currently    Types: Cocaine    Comment: 1-2x/mo last use 10/09/2021   Sexual activity: Not Currently  Other Topics Concern   Not on file  Social History Narrative   Not on file   Social Determinants of Health   Financial Resource Strain: Low Risk  (07/25/2022)   Overall Financial Resource Strain (CARDIA)    Difficulty of Paying Living Expenses: Not very hard  Food Insecurity: No Food Insecurity (07/25/2022)   Hunger Vital Sign    Worried About Running Out of Food in the Last Year: Never true    Ran Out of Food in the Last Year: Never true  Transportation Needs: No Transportation Needs (07/25/2022)   PRAPARE - Administrator, Civil Service (Medical): No    Lack of Transportation (Non-Medical): No  Physical Activity: Inactive (07/25/2022)   Exercise Vital Sign    Days of Exercise per Week: 0 days    Minutes of Exercise per Session: 0 min  Stress: No Stress Concern Present (07/25/2022)   [employer] of Occupational Health - Occupational Stress Questionnaire    Feeling of Stress : Only a little  Social Connections: Socially Isolated (07/25/2022)   Social Connection and Isolation Panel [NHANES]    Frequency of Communication with Friends and Family: More than three times a week    Frequency of Social Gatherings with Friends and Family: More than three times a week    Attends Religious Services: Never    Database administrator or Organizations: No    Attends Banker Meetings: Never    Marital Status: Divorced  Catering manager Violence: Not At Risk (07/25/2022)   Humiliation, Afraid, Rape, and Kick questionnaire    Fear of Current or Ex-Partner: No    Emotionally  Abused: No    Physically Abused: No    Sexually Abused: No   Family History  Problem Relation Age of Onset   Heart disease Neg Hx    BP (!) 178/102   Pulse 92   Wt 118.7 kg (261 lb 9.6 oz)   SpO2 95%   BMI 32.70 kg/m   Wt Readings from Last 3 Encounters:  09/01/22 118.7 kg (261 lb 9.6 oz)  08/25/22 118.8 kg (261 lb 12.8 oz)  08/11/22 120 kg (264 lb 9.6 oz)   PHYSICAL EXAM: General:  NAD. No resp difficulty, walked into clinic HEENT: Normal Neck: Supple. No JVD. Carotids 2+ bilat; no bruits. No lymphadenopathy or thryomegaly appreciated. Cor: PMI nondisplaced. Irregular rate &  rhythm. No rubs, gallops or murmurs. Lungs: Clear Abdomen: Soft, nontender, nondistended. No hepatosplenomegaly. No bruits or masses. Good bowel sounds. Extremities: No cyanosis, clubbing, rash, edema Neuro: Alert & oriented x 3, cranial nerves grossly intact. Moves all 4 extremities w/o difficulty. Affect pleasant.  ASSESSMENT & PLAN: Chronic Systolic Heart Failure -  Echo 1/23: EF 30 to 35%, mod LVH w/ GIIDD, normal RV (no prior study for comparison) -  Etiology uncertain. 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 He will need cath if renal fx improves (chest CT + coronary calcifications, multiple RFs, ETOH and cocaine may also contribute) - NYHA II, volume better today. - Increase hydralazine to 100 mg tid. - Continue Imdur 60 mg daily. - Continue Lasix 80 mg daily + 20 KCL daily. - Continue carvedilol 12.5 mg bid.  - Continue Farxiga 10 mg daily.  - No ARNi/ARB/Spiro/dig w/ CKD - Labs today.  - Repeat echo.  2. Atrial fibrillation, persistent  - Rate controlled. - CHA2DS2-VASc Score: at least 3  - Continue Eliquis 5 mg bid. - Needs to complete home sleep study. - Planning on DCCV 09/07/22.   3. Hypertension  - Long h/o poor control. - Elevated today. - Increase hydralazine as above.   4. CKD3 - Baseline SCr 1.8-2 - ? Hypertensive nephropathy. BP control per above.   - Continue Farxiga 10 mg daily. - BMET today.   5. Thoracic aortic aneurysm - aortic root measured 4.7 to 4.9 cm on noncontrast chest CT 1/23. - Had follow up with Dr. Henrickson, planning repeat CT chest in 6 months. - Needs tight BP control - Previously discussed adding statin, he declined. - Check lipids today.  6. Cocaine abuse - Last used 2 months ago - Advised complete cessation.  7. H/o Tobacco abuse - remains wuit from cigarettes - Congratulated.  8. Snoring - He has home sleep study, needs to complete this.  Follow up in 3 months with Dr. Bensimhon + echo.  Timothy Winegardner, FNP-BC 09/01/22 

## 2022-09-01 NOTE — Addendum Note (Signed)
Encounter addended by: Theresia Bough, CMA on: 09/01/2022 2:41 PM  Actions taken: Pharmacy for encounter modified, Order list changed

## 2022-09-01 NOTE — H&P (View-Only) (Signed)
ADVANCED HF CLINIC NOTE  Primary Care: Jeanie Sewer, NP HF Cardiologist: Dr. Haroldine Laws  HPI: Timothy Moon is a 66 y.o. male w/ history of poorly controlled HTN, ETOH use, tobacco use and cocaine use, and recently diagnosed with HFrEF.   Admitted to [facility] 1/23 with Acute HFrEF.  Echo showed moderately reduced LVEF 30 to 35%, mod LVH w/ GIIDD, normal RV. Chest CT also showed thoracic aortic aneurysm. Aortic root measures 4.7 to 4.9 cm. Coronary atherosclerosis also noted. UDS negative. Diuresed w/ IV Lasix. Did not undergo LHC given renal insufficiency, which also limited medical therapy. SCr 2.2 (baseline unknown). No ARNi/ARB/spiro/dig. Placed on Imdur/hydarl, coreg and farxiga. D/c wt 249 lb.   He was seen in the HF [facility] 1/23 and referred to the [facility]. Hypertensive at the visit. Coreg and hydralazine increased. Also started on Farxiga.   Follow up 2/23 in AHF clinic, found to be in AF, rate controlled. Eliquis started and referred to AF clinic to discuss DCCV. Seen in AF clinic 2/23, planned 3 weeks of correct Eliquis use then arrange DCCV.   Lost to follow up. No show for follow up with Dr. Haroldine Laws 3/23.  Follow up 10/23, worse NYHA III and volume overloaded. Lasix increased to 80 daily. Eliquis restarted, had follow up with AF clinic to arrange DCCV after 3 weeks back on AC.  Today he returns for HF follow up. Overall feeling fine. He is not SOB walking on flat ground. Gets winded going up stairs. Denies palpitations, abnormal bleeding, CP, dizziness, edema, or PND/Orthopnea. Appetite ok. No fever or chills. Weight at home 258-260 pounds. Taking all medications. BP at home 160's. No longer smoking, no ETOH in 13 years, no cocaine in a couple months. Has home sleep study but has not completed yet.   Cardiac Testing   - Echo (10/19/21): EF 30-35%. Global HK, moderate ventricular hypertrophy, grade II DD, RV normal Aneurysm of the ascending aorta, measuring 43  mm.   Past Medical History:  Diagnosis Date   Acute congestive heart failure ([place]) 10/19/2021   Ascending aortic aneurysm ([employer]) 11/23/2021   4.9 cm in January 2023   Chronic kidney disease, stage 3a ([place]) 10/19/2021   Cocaine abuse ([place]) 10/19/2021   Elevated troponin level not due myocardial infarction 10/19/2021   Essential hypertension 10/19/2021   Nicotine dependence, cigarettes, uncomplicated [id number]   Current Outpatient Medications  Medication Sig Dispense Refill   apixaban (ELIQUIS) 5 MG TABS tablet Take 1 tablet (5 mg total) by mouth 2 (two) times daily. 14 tablet 0   carvedilol (COREG) 12.5 MG tablet Take 1 tablet (12.5 mg total) by mouth 2 (two) times daily with a meal. 60 tablet 3   dapagliflozin propanediol (FARXIGA) 10 MG TABS tablet Take 1 tablet (10 mg total) by mouth daily. 30 tablet 3   furosemide (LASIX) 40 MG tablet Take 2 tablets (80 mg total) by mouth daily. 60 tablet 3   hydrALAZINE (APRESOLINE) 50 MG tablet Take 1.5 tablets (75 mg total) by mouth every 8 (eight) hours. 135 tablet 3   isosorbide mononitrate (IMDUR) 60 MG 24 hr tablet Take 1 tablet (60 mg total) by mouth daily. 30 tablet 3   Multiple Vitamin (MULTIVITAMIN) capsule Take 1 capsule by mouth as needed. Centrum Silver for men     nitroGLYCERIN (NITROSTAT) 0.4 MG SL tablet Place 1 tablet (0.4 mg total) under the tongue every 5 (five) minutes as needed for chest pain. 30 tablet 12   potassium chloride  SA (KLOR-CON M) 20 MEQ tablet Take 1 tablet (20 mEq total) by mouth daily. 30 tablet 3   No current facility-administered medications for this encounter.   No Known Allergies  Social History   Socioeconomic History   Marital status: Divorced    Spouse name: Not on file   Number of children: 2   Years of education: Not on file   Highest education level: Associate degree: academic program  Occupational History   Occupation: retired  Tobacco Use   Smoking status: Former    Packs/day: 1.00    Years: 20.00     Total pack years: 20.00    Types: Cigarettes    Quit date: 10/17/2021    Years since quitting: 0.8   Smokeless tobacco: Never  Vaping Use   Vaping Use: Never used  Substance and Sexual Activity   Alcohol use: Not Currently    Comment: heavy drinker x 7 years, quit 11 years ago.   Drug use: Not Currently    Types: Cocaine    Comment: 1-2x/mo last use 10/09/2021   Sexual activity: Not Currently  Other Topics Concern   Not on file  Social History Narrative   Not on file   Social Determinants of Health   Financial Resource Strain: Low Risk  (07/25/2022)   Overall Financial Resource Strain (CARDIA)    Difficulty of Paying Living Expenses: Not very hard  Food Insecurity: No Food Insecurity (07/25/2022)   Hunger Vital Sign    Worried About Running Out of Food in the Last Year: Never true    Ran Out of Food in the Last Year: Never true  Transportation Needs: No Transportation Needs (07/25/2022)   PRAPARE - Administrator, Civil Service (Medical): No    Lack of Transportation (Non-Medical): No  Physical Activity: Inactive (07/25/2022)   Exercise Vital Sign    Days of Exercise per Week: 0 days    Minutes of Exercise per Session: 0 min  Stress: No Stress Concern Present (07/25/2022)   [employer] of Occupational Health - Occupational Stress Questionnaire    Feeling of Stress : Only a little  Social Connections: Socially Isolated (07/25/2022)   Social Connection and Isolation Panel [NHANES]    Frequency of Communication with Friends and Family: More than three times a week    Frequency of Social Gatherings with Friends and Family: More than three times a week    Attends Religious Services: Never    Database administrator or Organizations: No    Attends Banker Meetings: Never    Marital Status: Divorced  Catering manager Violence: Not At Risk (07/25/2022)   Humiliation, Afraid, Rape, and Kick questionnaire    Fear of Current or Ex-Partner: No    Emotionally  Abused: No    Physically Abused: No    Sexually Abused: No   Family History  Problem Relation Age of Onset   Heart disease Neg Hx    BP (!) 178/102   Pulse 92   Wt 118.7 kg (261 lb 9.6 oz)   SpO2 95%   BMI 32.70 kg/m   Wt Readings from Last 3 Encounters:  09/01/22 118.7 kg (261 lb 9.6 oz)  08/25/22 118.8 kg (261 lb 12.8 oz)  08/11/22 120 kg (264 lb 9.6 oz)   PHYSICAL EXAM: General:  NAD. No resp difficulty, walked into clinic HEENT: Normal Neck: Supple. No JVD. Carotids 2+ bilat; no bruits. No lymphadenopathy or thryomegaly appreciated. Cor: PMI nondisplaced. Irregular rate &  rhythm. No rubs, gallops or murmurs. Lungs: Clear Abdomen: Soft, nontender, nondistended. No hepatosplenomegaly. No bruits or masses. Good bowel sounds. Extremities: No cyanosis, clubbing, rash, edema Neuro: Alert & oriented x 3, cranial nerves grossly intact. Moves all 4 extremities w/o difficulty. Affect pleasant.  ASSESSMENT & PLAN: Chronic Systolic Heart Failure -  Echo 1/23: EF 30 to 35%, mod LVH w/ GIIDD, normal RV (no prior study for comparison) -  Etiology uncertain. 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 He will need cath if renal fx improves (chest CT + coronary calcifications, multiple RFs, ETOH and cocaine may also contribute) - NYHA II, volume better today. - Increase hydralazine to 100 mg tid. - Continue Imdur 60 mg daily. - Continue Lasix 80 mg daily + 20 KCL daily. - Continue carvedilol 12.5 mg bid.  - Continue Farxiga 10 mg daily.  - No ARNi/ARB/Spiro/dig w/ CKD - Labs today.  - Repeat echo.  2. Atrial fibrillation, persistent  - Rate controlled. - CHA2DS2-VASc Score: at least 3  - Continue Eliquis 5 mg bid. - Needs to complete home sleep study. - Planning on DCCV 09/07/22.   3. Hypertension  - Long h/o poor control. - Elevated today. - Increase hydralazine as above.   4. CKD3 - Baseline SCr 1.8-2 - ? Hypertensive nephropathy. BP control per above.   - Continue Farxiga 10 mg daily. - BMET today.   5. Thoracic aortic aneurysm - aortic root measured 4.7 to 4.9 cm on noncontrast chest CT 1/23. - Had follow up with Dr. Skipper Cliche, planning repeat CT chest in 6 months. - Needs tight BP control - Previously discussed adding statin, he declined. - Check lipids today.  6. Cocaine abuse - Last used 2 months ago - Advised complete cessation.  7. H/o Tobacco abuse - remains wuit from cigarettes - Congratulated.  8. Snoring - He has home sleep study, needs to complete this.  Follow up in 3 months with Dr. Haroldine Laws + echo.  Allena Katz, FNP-BC 09/01/22

## 2022-09-07 ENCOUNTER — Ambulatory Visit (HOSPITAL_BASED_OUTPATIENT_CLINIC_OR_DEPARTMENT_OTHER): Payer: Medicare Other | Admitting: Certified Registered"

## 2022-09-07 ENCOUNTER — Encounter (HOSPITAL_COMMUNITY)
Admission: RE | Disposition: A | Discharge status: Home / Self Care | Payer: Self-pay | Source: Home / Self Care | Attending: Cardiology

## 2022-09-07 ENCOUNTER — Ambulatory Visit (HOSPITAL_COMMUNITY): Payer: Medicare Other | Admitting: Certified Registered"

## 2022-09-07 ENCOUNTER — Other Ambulatory Visit: Payer: Self-pay

## 2022-09-07 ENCOUNTER — Ambulatory Visit (HOSPITAL_COMMUNITY)
Admission: RE | Admit: 2022-09-07 | Discharge: 2022-09-07 | Disposition: A | Discharge status: Home / Self Care | Payer: Medicare Other | Attending: Cardiology | Admitting: Cardiology

## 2022-09-07 ENCOUNTER — Encounter (HOSPITAL_COMMUNITY): Payer: Self-pay | Admitting: Cardiology

## 2022-09-07 ENCOUNTER — Ambulatory Visit (HOSPITAL_COMMUNITY)
Admission: RE | Admit: 2022-09-07 | Discharge: 2022-09-07 | Disposition: A | Discharge status: Home / Self Care | Payer: Medicare Other | Source: Ambulatory Visit | Attending: Physician Assistant | Admitting: Physician Assistant

## 2022-09-07 DIAGNOSIS — Z87891 Personal history of nicotine dependence: Secondary | ICD-10-CM

## 2022-09-07 DIAGNOSIS — F1410 Cocaine abuse, uncomplicated: Secondary | ICD-10-CM | POA: Diagnosis not present

## 2022-09-07 DIAGNOSIS — Z7901 Long term (current) use of anticoagulants: Secondary | ICD-10-CM | POA: Diagnosis not present

## 2022-09-07 DIAGNOSIS — I4891 Unspecified atrial fibrillation: Secondary | ICD-10-CM | POA: Diagnosis not present

## 2022-09-07 DIAGNOSIS — I509 Heart failure, unspecified: Secondary | ICD-10-CM

## 2022-09-07 DIAGNOSIS — N1831 Chronic kidney disease, stage 3a: Secondary | ICD-10-CM | POA: Diagnosis not present

## 2022-09-07 DIAGNOSIS — I130 Hypertensive heart and chronic kidney disease with heart failure and stage 1 through stage 4 chronic kidney disease, or unspecified chronic kidney disease: Secondary | ICD-10-CM | POA: Diagnosis not present

## 2022-09-07 DIAGNOSIS — I4819 Other persistent atrial fibrillation: Secondary | ICD-10-CM

## 2022-09-07 DIAGNOSIS — I5022 Chronic systolic (congestive) heart failure: Secondary | ICD-10-CM | POA: Insufficient documentation

## 2022-09-07 DIAGNOSIS — I110 Hypertensive heart disease with heart failure: Secondary | ICD-10-CM

## 2022-09-07 DIAGNOSIS — Z79899 Other long term (current) drug therapy: Secondary | ICD-10-CM | POA: Insufficient documentation

## 2022-09-07 HISTORY — PX: CARDIOVERSION: SHX1299

## 2022-09-07 LAB — CBC
HCT: 49 % (ref 39.0–52.0)
Hemoglobin: 16.2 g/dL (ref 13.0–17.0)
MCH: 28.9 pg (ref 26.0–34.0)
MCHC: 33.1 g/dL (ref 30.0–36.0)
MCV: 87.3 fL (ref 80.0–100.0)
Platelets: 215 10*3/uL (ref 150–400)
RBC: 5.61 MIL/uL (ref 4.22–5.81)
RDW: 14.8 % (ref 11.5–15.5)
WBC: 10.7 10*3/uL — ABNORMAL HIGH (ref 4.0–10.5)
nRBC: 0 % (ref 0.0–0.2)

## 2022-09-07 LAB — BASIC METABOLIC PANEL
Anion gap: 8 (ref 5–15)
BUN: 32 mg/dL — ABNORMAL HIGH (ref 8–23)
CO2: 26 mmol/L (ref 22–32)
Calcium: 9.4 mg/dL (ref 8.9–10.3)
Chloride: 109 mmol/L (ref 98–111)
Creatinine, Ser: 2 mg/dL — ABNORMAL HIGH (ref 0.61–1.24)
GFR, Estimated: 36 mL/min — ABNORMAL LOW (ref >60)
Glucose, Bld: 106 mg/dL — ABNORMAL HIGH (ref 70–99)
Potassium: 3.8 mmol/L (ref 3.5–5.1)
Sodium: 143 mmol/L (ref 135–145)

## 2022-09-07 SURGERY — CARDIOVERSION
Anesthesia: General

## 2022-09-07 MED ORDER — SODIUM CHLORIDE 0.9 % IV SOLN: INTRAVENOUS | Status: DC

## 2022-09-07 MED ORDER — PROPOFOL 10 MG/ML IV BOLUS
INTRAVENOUS | Status: DC | PRN
  Administered 2022-09-07: 120 mg via INTRAVENOUS
  Administered 2022-09-07: 80 mg via INTRAVENOUS

## 2022-09-07 MED ORDER — LIDOCAINE 2% (20 MG/ML) 5 ML SYRINGE
INTRAMUSCULAR | Status: DC | PRN
  Administered 2022-09-07: 60 mg via INTRAVENOUS

## 2022-09-07 MED ORDER — SODIUM CHLORIDE 0.9 % IV SOLN
INTRAVENOUS | Status: AC | PRN
  Administered 2022-09-07: 500 mL via INTRAVENOUS

## 2022-09-07 NOTE — CV Procedure (Signed)
Procedure: Electrical Cardioversion Indications:  Atrial Fibrillation  Procedure Details:  Consent: Risks of procedure as well as the alternatives and risks of each were explained to the (patient/caregiver).  Consent for procedure obtained.  Time Out: Verified patient identification, verified procedure, site/side was marked, verified correct patient position, special equipment/implants available, medications/allergies/relevent history reviewed, required imaging and test results available. PERFORMED.  Patient placed on cardiac monitor, pulse oximetry, supplemental oxygen as necessary.  Sedation given:  Propofol 200mg ; lidocaine 60mg  daily Pacer pads placed anterior and posterior chest.  Cardioverted 2 time(s).  Cardioversion with synchronized biphasic 200J shock.  Evaluation: Findings: Post procedure EKG shows: NSR Complications: None Patient did tolerate procedure well.  Time Spent Directly with the Patient:    09/07/2022, 11:20 AM

## 2022-09-07 NOTE — Anesthesia Preprocedure Evaluation (Addendum)
Anesthesia Evaluation  Patient identified by MRN, date of birth, ID band Patient awake    Reviewed: Allergy & Precautions, H&P , NPO status , Patient's Chart, lab work & pertinent test results  Airway Mallampati: II   Neck ROM: full    Dental   Pulmonary former smoker   breath sounds clear to auscultation       Cardiovascular hypertension, + Peripheral Vascular Disease and +CHF  + dysrhythmias Atrial Fibrillation  Rhythm:irregular Rate:Normal  Ascending aortic aneurysm   Neuro/Psych    GI/Hepatic ,,,(+)     substance abuse  cocaine use  Endo/Other    Renal/GU Renal InsufficiencyRenal disease     Musculoskeletal   Abdominal   Peds  Hematology   Anesthesia Other Findings   Reproductive/Obstetrics                             Anesthesia Physical Anesthesia Plan  ASA: 3  Anesthesia Plan: General   Post-op Pain Management:    Induction: Intravenous  PONV Risk Score and Plan: 2 and Propofol infusion and Treatment may vary due to age or medical condition  Airway Management Planned: Mask  Additional Equipment:   Intra-op Plan:   Post-operative Plan:   Informed Consent: I have reviewed the patients History and Physical, chart, labs and discussed the procedure including the risks, benefits and alternatives for the proposed anesthesia with the patient or authorized representative who has indicated his/her understanding and acceptance.     Dental advisory given  Plan Discussed with: CRNA, Anesthesiologist and Surgeon  Anesthesia Plan Comments:        Anesthesia Quick Evaluation

## 2022-09-07 NOTE — Interval H&P Note (Signed)
History and Physical Interval Note:  09/07/2022 9:41 AM  Timothy Moon  has presented today for surgery, with the diagnosis of AFIB.  The various methods of treatment have been discussed with the patient and family. After consideration of risks, benefits and other options for treatment, the patient has consented to  Procedure(s): CARDIOVERSION (N/A) as a surgical intervention.  The patient's history has been reviewed, patient examined, no change in status, stable for surgery.  I have reviewed the patient's chart and labs.  Questions were answered to the patient's satisfaction.     Meriam Sprague

## 2022-09-07 NOTE — Discharge Instructions (Signed)

## 2022-09-07 NOTE — Transfer of Care (Signed)
Immediate Anesthesia Transfer of Care Note  Patient: Timothy Moon  Procedure(s) Performed: CARDIOVERSION  Patient Location: Endoscopy Unit  Anesthesia Type:General  Level of Consciousness: drowsy and patient cooperative  Airway & Oxygen Therapy: Patient Spontanous Breathing  Post-op Assessment: Report given to RN  Post vital signs: Reviewed and stable  Last Vitals:  Vitals Value Taken Time  BP 137/88   Temp    Pulse 87   Resp 20   SpO2 94     Last Pain:  Vitals:   09/07/22 0944  TempSrc: Temporal  PainSc: 0-No pain         Complications: No notable events documented.

## 2022-09-08 NOTE — Anesthesia Postprocedure Evaluation (Signed)
Anesthesia Post Note  Patient: Timothy Moon  Procedure(s) Performed: CARDIOVERSION     Patient location during evaluation: Endoscopy Anesthesia Type: General Level of consciousness: awake and alert Pain management: pain level controlled Vital Signs Assessment: post-procedure vital signs reviewed and stable Respiratory status: spontaneous breathing, nonlabored ventilation, respiratory function stable and patient connected to nasal cannula oxygen Cardiovascular status: blood pressure returned to baseline and stable Postop Assessment: no apparent nausea or vomiting Anesthetic complications: no   No notable events documented.  Last Vitals:  Vitals:   09/07/22 1133 09/07/22 1141  BP: (!) 140/103 (!) 135/103  Pulse: 81 79  Resp: 18 14  Temp:    SpO2: 97% 96%    Last Pain:  Vitals:   09/07/22 1141  TempSrc:   PainSc: 0-No pain                 Keian Odriscoll S

## 2022-09-09 ENCOUNTER — Other Ambulatory Visit (HOSPITAL_COMMUNITY): Payer: Self-pay

## 2022-09-11 ENCOUNTER — Encounter (HOSPITAL_COMMUNITY): Payer: Self-pay | Admitting: Cardiology

## 2022-09-14 ENCOUNTER — Ambulatory Visit (HOSPITAL_COMMUNITY): Payer: Medicare Other | Admitting: Nurse Practitioner

## 2022-09-21 ENCOUNTER — Other Ambulatory Visit (HOSPITAL_COMMUNITY): Payer: Self-pay

## 2022-09-22 ENCOUNTER — Ambulatory Visit (HOSPITAL_COMMUNITY)
Admission: RE | Admit: 2022-09-22 | Discharge: 2022-09-22 | Disposition: A | Discharge status: Home / Self Care | Payer: Medicare Other | Source: Ambulatory Visit | Attending: Nurse Practitioner | Admitting: Nurse Practitioner

## 2022-09-22 VITALS — BP 180/100 | HR 96 | Ht 75.0 in | Wt 268.4 lb

## 2022-09-22 DIAGNOSIS — Z7901 Long term (current) use of anticoagulants: Secondary | ICD-10-CM | POA: Diagnosis not present

## 2022-09-22 DIAGNOSIS — I4819 Other persistent atrial fibrillation: Secondary | ICD-10-CM

## 2022-09-22 DIAGNOSIS — I7121 Aneurysm of the ascending aorta, without rupture: Secondary | ICD-10-CM | POA: Insufficient documentation

## 2022-09-22 DIAGNOSIS — I110 Hypertensive heart disease with heart failure: Secondary | ICD-10-CM | POA: Insufficient documentation

## 2022-09-22 DIAGNOSIS — Z720 Tobacco use: Secondary | ICD-10-CM | POA: Insufficient documentation

## 2022-09-22 DIAGNOSIS — D6869 Other thrombophilia: Secondary | ICD-10-CM | POA: Diagnosis not present

## 2022-09-22 DIAGNOSIS — F1090 Alcohol use, unspecified, uncomplicated: Secondary | ICD-10-CM | POA: Diagnosis not present

## 2022-09-22 DIAGNOSIS — F1490 Cocaine use, unspecified, uncomplicated: Secondary | ICD-10-CM | POA: Insufficient documentation

## 2022-09-22 DIAGNOSIS — I5020 Unspecified systolic (congestive) heart failure: Secondary | ICD-10-CM | POA: Insufficient documentation

## 2022-09-22 NOTE — Progress Notes (Signed)
Primary Care Physician: Dulce Sellar, NP Referring Physician: [name], FNP   Timothy Moon is a 66 y.o. male with a h/o of poorly controlled HTN, ETOH use, tobacco use and cocaine use, and recently diagnosed with HFrEF.   Admitted to [facility] 1/23 with Acute HFrEF.  Echo showed moderately reduced LVEF 30 to 35%, mod LVH w/ GIIDD, normal RV. Chest CT also showed thoracic aortic aneurysm. Aortic root measures 4.7 to 4.9 cm. Coronary atherosclerosis also noted. UDS negative. Diuresed w/ IV Lasix. Did not undergo LHC given renal insufficiency, which also limited medical therapy. SCr 2.2 (baseline unknown). No ARNi/ARB/spiro/dig. Placed on Imdur/hydarl, coreg and farxiga. D/c wt 249 lb.   He was seen in the HF [facility] 1/23 and referred to [facility]. Hypertensive at the visit. Coreg and hydralazine increased. Also started on farxiga. - Echo 10/19/21 EF 30-35%. Global HK, moderate ventricular hypertrophy, grade II DD, RV normal Aneurysm of the ascending aorta, measuring 43 mm.   He was seen by [name], FNP, 11/24/21 and was started on eliquis 5 mg bid and referred to afib clinic to discuss cardioversion. However, he states today that he has only been taking  eliquis 1x daily since 2/9 . His HR's at home in afib  are in the 60-80's.   He is here with his friend today. He states no recent cocaine. Denies alcohol use. He does not appear to be symptomatic with the afib. States compliance with BB. Recently increased.   F/u in afib clinic, 08/11/22. I have not seen since February at which time he was suppose to see me back in 2 weeks to have a cardioversion. He missed Dr. Prescott Gum visit around that time as well. He recently aw [name], NP, 10/24 and was still in afib. He is here now to see about obtaining cardioversion. In the meantime, pt states that he has been feeling all right. He has a rx for eliquis thru the mail coming to him, but he was given samples today to  start back tonight. After 21 days on anticoagulation, we discussed going on to have a cardioversion. He is in agreement.   F/u in afib clinic, pt states that he has been back on eliquis 5 mg bid since 10/27. No missed doses since then. He remains in afib at 111 bpm. Fluid status stable. He wants to proceed with cardioversion.   F/u  afib clinic, 09/22/22 after successful cardioversion from 09/07/22 but unfortunately ERAF. I will refer to EP to be considered for ablation. EKG shows Afib at 96 bpm.    Today, he denies symptoms of palpitations, chest pain, shortness of breath, orthopnea, PND, lower extremity edema, dizziness, presyncope, syncope, or neurologic sequela. The patient is tolerating medications without difficulties and is otherwise without complaint today.   Past Medical History:  Diagnosis Date   Acute congestive heart failure (HCC) 10/19/2021   Ascending aortic aneurysm (HCC) 11/23/2021   4.9 cm in January 2023   Chronic kidney disease, stage 3a (HCC) 10/19/2021   Cocaine abuse (HCC) 10/19/2021   Elevated troponin level not due myocardial infarction 10/19/2021   Essential hypertension 10/19/2021   Nicotine dependence, cigarettes, uncomplicated 10/19/2021   Past Surgical History:  Procedure Laterality Date   CARDIOVERSION N/A 09/07/2022   Procedure: CARDIOVERSION;  Surgeon: Meriam Sprague, MD;  Location: [facility] ENDOSCOPY;  Service: Cardiovascular;  Laterality: N/A;    Current Outpatient Medications  Medication Sig Dispense Refill   apixaban (ELIQUIS) 5 MG TABS tablet Take  1 tablet (5 mg total) by mouth 2 (two) times daily. 14 tablet 0   carvedilol (COREG) 12.5 MG tablet Take 1 tablet (12.5 mg total) by mouth 2 (two) times daily with a meal. 60 tablet 3   dapagliflozin propanediol (FARXIGA) 10 MG TABS tablet Take 1 tablet (10 mg total) by mouth daily. 30 tablet 3   furosemide (LASIX) 40 MG tablet Take 2 tablets (80 mg total) by mouth daily. 60 tablet 3   hydrALAZINE (APRESOLINE) 100 MG  tablet Take 1 tablet (100 mg total) by mouth 3 (three) times daily. 90 tablet 3   isosorbide mononitrate (IMDUR) 60 MG 24 hr tablet Take 1 tablet (60 mg total) by mouth daily. 30 tablet 3   Multiple Vitamin (MULTIVITAMIN) capsule Take 1 capsule by mouth as needed. Centrum Silver for men     nitroGLYCERIN (NITROSTAT) 0.4 MG SL tablet Place 1 tablet (0.4 mg total) under the tongue every 5 (five) minutes as needed for chest pain. 30 tablet 12   potassium chloride SA (KLOR-CON M) 20 MEQ tablet Take 1 tablet (20 mEq total) by mouth daily. 30 tablet 3   No current facility-administered medications for this encounter.    No Known Allergies  Social History   Socioeconomic History   Marital status: Divorced    Spouse name: Not on file   Number of children: 2   Years of education: Not on file   Highest education level: Associate degree: academic program  Occupational History   Occupation: retired  Tobacco Use   Smoking status: Former    Packs/day: 1.00    Years: 20.00    Total pack years: 20.00    Types: Cigarettes    Quit date: 10/17/2021    Years since quitting: 0.9   Smokeless tobacco: Never  Vaping Use   Vaping Use: Never used  Substance and Sexual Activity   Alcohol use: Not Currently    Comment: heavy drinker x 7 years, quit 11 years ago.   Drug use: Not Currently    Types: Cocaine    Comment: 1-2x/mo last use 10/09/2021   Sexual activity: Not Currently  Other Topics Concern   Not on file  Social History Narrative   Not on file   Social Determinants of Health   Financial Resource Strain: Low Risk  (07/25/2022)   Overall Financial Resource Strain (CARDIA)    Difficulty of Paying Living Expenses: Not very hard  Food Insecurity: No Food Insecurity (07/25/2022)   Hunger Vital Sign    Worried About Running Out of Food in the Last Year: Never true    Ran Out of Food in the Last Year: Never true  Transportation Needs: No Transportation Needs (07/25/2022)   PRAPARE -  Administrator, Civil Service (Medical): No    Lack of Transportation (Non-Medical): No  Physical Activity: Inactive (07/25/2022)   Exercise Vital Sign    Days of Exercise per Week: 0 days    Minutes of Exercise per Session: 0 min  Stress: No Stress Concern Present (07/25/2022)   [employer] of Occupational Health - Occupational Stress Questionnaire    Feeling of Stress : Only a little  Social Connections: Socially Isolated (07/25/2022)   Social Connection and Isolation Panel [NHANES]    Frequency of Communication with Friends and Family: More than three times a week    Frequency of Social Gatherings with Friends and Family: More than three times a week    Attends Religious Services: Never  Active Member of Clubs or Organizations: No    Attends Banker Meetings: Never    Marital Status: Divorced  Catering manager Violence: Not At Risk (07/25/2022)   Humiliation, Afraid, Rape, and Kick questionnaire    Fear of Current or Ex-Partner: No    Emotionally Abused: No    Physically Abused: No    Sexually Abused: No    Family History  Problem Relation Age of Onset   Heart disease Neg Hx     ROS- All systems are reviewed and negative except as per the HPI above  Physical Exam: There were no vitals filed for this visit.  Wt Readings from Last 3 Encounters:  09/01/22 118.7 kg  08/25/22 118.8 kg  08/11/22 120 kg    Labs: Lab Results  Component Value Date   NA 143 09/07/2022   K 3.8 09/07/2022   CL 109 09/07/2022   CO2 26 09/07/2022   GLUCOSE 106 (H) 09/07/2022   BUN 32 (H) 09/07/2022   CREATININE 2.00 (H) 09/07/2022   CALCIUM 9.4 09/07/2022   PHOS 3.7 10/20/2021   MG 2.5 (H) 10/20/2021   No results found for: "INR" Lab Results  Component Value Date   CHOL 121 09/01/2022   HDL 32 (L) 09/01/2022   LDLCALC 68 09/01/2022   TRIG 106 09/01/2022     GEN- The patient is well appearing, alert and oriented x 3 today.   Head- normocephalic,  atraumatic Eyes-  Sclera clear, conjunctiva pink Ears- hearing intact Oropharynx- clear Neck- supple, no JVP Lymph- no cervical lymphadenopathy Lungs- Clear to ausculation bilaterally, normal work of breathing Heart- irregular rate and rhythm, no murmurs, rubs or gallops, PMI not laterally displaced GI- soft, NT, ND, + BS Extremities- no clubbing, cyanosis, or mild  edema MS- no significant deformity or atrophy Skin- no rash or lesion Psych- euthymic mood, full affect Neuro- strength and sensation are intact   EKG at time of cardioversion  Vent. rate 82 BPM PR interval 214 ms QRS duration 104 ms QT/QTcB 422/493 ms P-R-T axes 54 59 89 Sinus rhythm with 1st degree A-V block Possible Left atrial enlargement Prolonged QT Confirmed by Steffanie Dunn [phone]) on 09/07/2022 10:37:27 PM  EKG today-Atrial fibrillation at 96 bpm, qrs int 100 ms, qtc 462 ms   Assessment and Plan:  1. Persistent afib  Off anticoagulation for 2 months, now back on since 10/27 and will be eligible for cardioversion after 11/17 , scheduled for 11/22 Successful cardioversion but ERAF Continue carvedilol 12.5 mg bid  Will refer to EP to be considered for ablation   2. CHA2DS2VASc score of at least 3 He states compliance with  eliquis 5 mg bid   3. CHF Per HF States needs new batteries in scales and BP cuff at home to monotr Encouraged to monitor weight daily  4. Ascending aortic aneurysm Recently evaluated by Dr. Dorris Fetch He is following   TO EP   Elvina Sidle. Matthew Fol[facility] [address] [place], [place] [address] [phone]

## 2022-09-24 ENCOUNTER — Other Ambulatory Visit: Payer: Self-pay | Admitting: Family

## 2022-11-03 ENCOUNTER — Other Ambulatory Visit: Payer: Self-pay | Admitting: Thoracic Surgery (Cardiothoracic Vascular Surgery)

## 2022-11-03 ENCOUNTER — Encounter: Payer: Self-pay | Admitting: *Deleted

## 2022-11-03 ENCOUNTER — Other Ambulatory Visit (HOSPITAL_COMMUNITY): Payer: Self-pay

## 2022-11-03 ENCOUNTER — Ambulatory Visit: Payer: Medicare Other | Attending: Cardiology | Admitting: Cardiology

## 2022-11-03 ENCOUNTER — Encounter: Payer: Self-pay | Admitting: Cardiology

## 2022-11-03 VITALS — BP 188/110 | HR 101 | Ht 75.0 in | Wt 272.0 lb

## 2022-11-03 DIAGNOSIS — I5022 Chronic systolic (congestive) heart failure: Secondary | ICD-10-CM | POA: Diagnosis not present

## 2022-11-03 DIAGNOSIS — R0602 Shortness of breath: Secondary | ICD-10-CM | POA: Diagnosis not present

## 2022-11-03 DIAGNOSIS — I7121 Aneurysm of the ascending aorta, without rupture: Secondary | ICD-10-CM

## 2022-11-03 DIAGNOSIS — I4819 Other persistent atrial fibrillation: Secondary | ICD-10-CM | POA: Diagnosis not present

## 2022-11-03 DIAGNOSIS — I5020 Unspecified systolic (congestive) heart failure: Secondary | ICD-10-CM | POA: Diagnosis not present

## 2022-11-03 DIAGNOSIS — D6869 Other thrombophilia: Secondary | ICD-10-CM | POA: Diagnosis not present

## 2022-11-03 MED ORDER — AMIODARONE HCL 200 MG PO TABS
ORAL_TABLET | ORAL | 1 refills | Status: DC
  Filled 2022-11-03: qty 90, 34d supply, fill #0

## 2022-11-03 NOTE — Progress Notes (Signed)
Electrophysiology Office Note   Date:  11/03/2022   ID:  [name], DOB 08-13-1956, MRN [id number]  PCP:  Dulce Sellar, NP  Cardiologist:  Bensimhon Primary Electrophysiologist:  Traeh Milroy Jorja Loa, MD    Chief Complaint: AF   History of Present Illness: Timothy Moon is a 67 y.o. male who is being seen today for the evaluation of AF at the request of Newman Nip, NP. Presenting today for electrophysiology evaluation.  He has a history significant for chronic systolic heart failure, ascending aortic aneurysm, CKD stage III, hypertension, tobacco abuse.  He presented to [facility] January 2023 with acute heart failure.  He did not undergo left heart catheterization due to renal deficiency.  He was seen in cardiology clinic 11/24/2021 and was noted to be in atrial fibrillation.  He has had cardioversions in the past.  Today, he denies symptoms of palpitations, chest pain, orthopnea, PND, lower extremity edema, claudication, dizziness, presyncope, syncope, bleeding, or neurologic sequela. The patient is tolerating medications without difficulties.  His main symptoms are shortness of breath and fatigue.  He states that he gets short of breath doing much daily activity.  His blood pressure significantly elevated today, but has been better controlled.  It is unclear as to whether or not he felt better in sinus rhythm versus atrial fibrillation.   Past Medical History:  Diagnosis Date   Acute congestive heart failure (HCC) 10/19/2021   Ascending aortic aneurysm (HCC) 11/23/2021   4.9 cm in January 2023   Chronic kidney disease, stage 3a (HCC) 10/19/2021   Cocaine abuse (HCC) 10/19/2021   Elevated troponin level not due myocardial infarction 10/19/2021   Essential hypertension 10/19/2021   Nicotine dependence, cigarettes, uncomplicated 10/19/2021   Past Surgical History:  Procedure Laterality Date   CARDIOVERSION N/A 09/07/2022   Procedure: CARDIOVERSION;  Surgeon:  Meriam Sprague, MD;  Location: [facility] ENDOSCOPY;  Service: Cardiovascular;  Laterality: N/A;     Current Outpatient Medications  Medication Sig Dispense Refill   amiodarone (PACERONE) 200 MG tablet Take 2 tablets (400 mg total) TWICE a day for 2 weeks, then take 1 tablet (200 mg total) TWICE a day for 2 weeks, then take 1 tablet (200 mg total) ONCE a day 90 tablet 1   apixaban (ELIQUIS) 5 MG TABS tablet Take 1 tablet (5 mg total) by mouth 2 (two) times daily. 14 tablet 0   carvedilol (COREG) 12.5 MG tablet Take 1 tablet (12.5 mg total) by mouth 2 (two) times daily with a meal. 60 tablet 3   dapagliflozin propanediol (FARXIGA) 10 MG TABS tablet Take 1 tablet (10 mg total) by mouth daily. 30 tablet 3   furosemide (LASIX) 40 MG tablet Take 2 tablets (80 mg total) by mouth daily. 60 tablet 3   hydrALAZINE (APRESOLINE) 100 MG tablet Take 1 tablet (100 mg total) by mouth 3 (three) times daily. 90 tablet 3   isosorbide mononitrate (IMDUR) 60 MG 24 hr tablet Take 1 tablet (60 mg total) by mouth daily. 30 tablet 3   Multiple Vitamin (MULTIVITAMIN) capsule Take 1 capsule by mouth as needed. Centrum Silver for men     nitroGLYCERIN (NITROSTAT) 0.4 MG SL tablet Place 1 tablet (0.4 mg total) under the tongue every 5 (five) minutes as needed for chest pain. 30 tablet 12   potassium chloride SA (KLOR-CON M) 20 MEQ tablet Take 1 tablet (20 mEq total) by mouth daily. 30 tablet 3   No current facility-administered medications for this visit.  Allergies:   Patient has no known allergies.   Social History:  The patient  reports that he quit smoking about 12 months ago. His smoking use included cigarettes. He has a 20.00 pack-year smoking history. He has never used smokeless tobacco. He reports that he does not currently use alcohol. He reports that he does not currently use drugs after having used the following drugs: Cocaine.   Family History:  The patient's family history is not on file.    ROS:  Please  see the history of present illness.   Otherwise, review of systems is positive for none.   All other systems are reviewed and negative.    PHYSICAL EXAM: VS:  BP (!) 188/110   Pulse (!) 101   Ht 6\' 3"  (1.905 m)   Wt 272 lb (123.4 kg)   SpO2 93%   BMI 34.00 kg/m  , BMI Body mass index is 34 kg/m. GEN: Well nourished, well developed, in no acute distress  HEENT: normal  Neck: no JVD, carotid bruits, or masses Cardiac: irregular; no murmurs, rubs, or gallops,no edema  Respiratory:  clear to auscultation bilaterally, normal work of breathing GI: soft, nontender, nondistended, + BS MS: no deformity or atrophy  Skin: warm and dry Neuro:  Strength and sensation are intact Psych: euthymic mood, full affect  EKG:  EKG is ordered today. Personal review of the ekg ordered shows atrial fibrillation, rate 101  Recent Labs: 11/24/2021: ALT 37; TSH 0.912 09/01/2022: B Natriuretic Peptide 478.5 09/07/2022: BUN 32; Creatinine, Ser 2.00; Hemoglobin 16.2; Platelets 215; Potassium 3.8; Sodium 143    Lipid Panel     Component Value Date/Time   CHOL 121 09/01/2022 1429   TRIG 106 09/01/2022 1429   HDL 32 (L) 09/01/2022 1429   CHOLHDL 3.8 09/01/2022 1429   VLDL 21 09/01/2022 1429   LDLCALC 68 09/01/2022 1429     Wt Readings from Last 3 Encounters:  11/03/22 272 lb (123.4 kg)  09/22/22 268 lb 6.4 oz (121.7 kg)  09/01/22 261 lb 9.6 oz (118.7 kg)      Other studies Reviewed: Additional studies/ records that were reviewed today include: TTE 10/19/21  Review of the above records today demonstrates:   1. Left ventricular ejection fraction, by estimation, is 30 to 35%. The  left ventricle has moderately decreased function. The left ventricle  demonstrates global hypokinesis. The left ventricular internal cavity size  was moderately dilated. There is  moderate concentric left ventricular hypertrophy. Left ventricular  diastolic parameters are consistent with Grade II diastolic dysfunction   (pseudonormalization).   2. Right ventricular systolic function is normal. The right ventricular  size is normal.   3. The mitral valve is normal in structure. Mild mitral valve  regurgitation. No evidence of mitral stenosis.   4. The aortic valve is normal in structure. Aortic valve regurgitation is  mild. Aortic valve sclerosis is present, with no evidence of aortic valve  stenosis.   5. Aneurysm of the ascending aorta, measuring 43 mm.   6. The inferior vena cava is normal in size with greater than 50%  respiratory variability, suggesting right atrial pressure of 3 mmHg.    ASSESSMENT AND PLAN:  1.  Persistent atrial fibrillation: CHA2DS2-VASc of 3.  Currently on Eliquis 5 mg twice daily.  He is feeling quite short of breath today.  His blood pressure is elevated.  I am unclear as to whether or not the shortness of breath is from atrial fibrillation, heart failure, or elevated  blood pressure.  He would benefit from maintenance of sinus rhythm.  Rusty Villella start him on amiodarone today and plan for cardioversion.  2.  Chronic systolic heart failure: Has follow-up with heart failure cardiology.  No obvious volume overload.  He would likely benefit from ablation for atrial fibrillation instead of long-term amiodarone.  That being said, we do not have an idea of his coronary arteries and he does have significant coronary artery calcification.  Piercen Covino plan for a PET scan to rule out coronary artery disease.  Kindel Rochefort discuss this further with his primary cardiologist.  3.  Ascending aortic aneurysm: Followed by cardiac surgery.  4.  Secondary hypercoagulable state: Currently on Eliquis for atrial fibrillation as above  5.  Chronic renal failure: Followed by heart failure clinic.  Working to avoid contrast.  Current medicines are reviewed at length with the patient today.   The patient does not have concerns regarding his medicines.  The following changes were made today: Start amiodarone  Labs/ tests  ordered today include:  Orders Placed This Encounter  Procedures   NM PET CT CARDIAC PERFUSION MULTI W/ABSOLUTE BLOODFLOW   Pro b natriuretic peptide (BNP)   Cardiac Stress Test: Informed Consent Details: Physician/Practitioner Attestation; Transcribe to consent form and obtain patient signature   EKG 12-Lead     Disposition:   FU with Judson Tsan 6 months  Signed, Shawonda Kerce Meredith Leeds, MD  11/03/2022 12:10 PM     [facility] [address] [place] [address] [phone] (office) [phone] (fax)

## 2022-11-03 NOTE — Patient Instructions (Addendum)
Medication Instructions:  Your physician has recommended you make the following change in your medication:  START Amiodarone  - take 2 tablets (400 mg total) TWICE a day for 2 weeks, then  - take 1 tablet (200 mg total) TWICE a day for 2 weeks, then  - take 1 tablet (200 mg total) ONCE a day  *If you need a refill on your cardiac medications before your next appointment, please call your pharmacy*   Lab Work: Today: BNP  If you have labs (blood work) drawn today and your tests are completely normal, you will receive your results only by: [place] (if you have MyChart) OR A paper copy in the mail If you have any lab test that is abnormal or we need to change your treatment, we will call you to review the results.   Testing/Procedures: Your physician has recommended that you have a Cardioversion (DCCV). Electrical Cardioversion uses a jolt of electricity to your heart either through paddles or wired patches attached to your chest. This is a controlled, usually prescheduled, procedure. Defibrillation is done under light anesthesia in the hospital, and you usually go home the day of the procedure. This is done to get your heart back into a normal rhythm. You are not awake for the procedure. Please see the instruction sheet given to you today.  Your physician has recommended a cardiac PET scan How to Prepare for Your Cardiac PET/CT Stress Test:  1. Please do not take these medications before your test:   Medications that may interfere with the cardiac pharmacological stress agent (ex. nitrates - including erectile dysfunction medications, isosorbide mononitrate or beta-blockers) the day of the exam. (Erectile dysfunction medication should be held for at least 72 hrs prior to test) Theophylline containing medications for 12 hours. Dipyridamole 48 hours prior to the test. Your remaining medications may be taken with water.  2. Nothing to eat or drink, except water, 3 hours prior to  arrival time.   NO caffeine/decaffeinated products, or chocolate 12 hours prior to arrival.  3. NO perfume, cologne or lotion  4. Total time is 1 to 2 hours; you may want to bring reading material for the waiting time.  5. Please report to Admitting at the [facility] Main Entrance 30 minutes early for your test.  [address], [place] [address]  Diabetic Preparation:  Hold oral medications. You may take NPH and Lantus insulin. Do not take Humalog or Humulin R (Regular Insulin) the day of your test. Check blood sugars prior to leaving the house. If able to eat breakfast prior to 3 hour fasting, you may take all medications, including your insulin, Do not worry if you miss your breakfast dose of insulin - start at your next meal.  IF YOU THINK YOU MAY BE PREGNANT, OR ARE NURSING PLEASE INFORM THE TECHNOLOGIST.  In preparation for your appointment, medication and supplies will be purchased.  Appointment availability is limited, so if you need to cancel or reschedule, please call the Radiology Department at [phone]  24 hours in advance to avoid a cancellation fee of $100.00  What to Expect After you Arrive:  Once you arrive and check in for your appointment, you will be taken to a preparation room within the Radiology Department.  A technologist or Nurse will obtain your medical history, verify that you are correctly prepped for the exam, and explain the procedure.  Afterwards,  an IV will be started in your arm and electrodes will  be placed on your skin for EKG monitoring during the stress portion of the exam. Then you will be escorted to the PET/CT scanner.  There, staff will get you positioned on the scanner and obtain a blood pressure and EKG.  During the exam, you will continue to be connected to the EKG and blood pressure machines.  A small, safe amount of a radioactive tracer will be injected in your IV to obtain a series of pictures of your heart along with an  injection of a stress agent.    After your Exam:  It is recommended that you eat a meal and drink a caffeinated beverage to counter act any effects of the stress agent.  Drink plenty of fluids for the remainder of the day and urinate frequently for the first couple of hours after the exam.  Your doctor will inform you of your test results within 7-10 business days.  For questions about your test or how to prepare for your test, please call: Rockwell Alexandria, Cardiac Imaging Nurse Navigator  Larey Brick, Cardiac Imaging Nurse Navigator Office: [phone]   Follow-Up: At [facility], you and your health needs are our priority.  As part of our continuing mission to provide you with exceptional heart care, we have created designated Provider Care Teams.  These Care Teams include your primary Cardiologist (physician) and Advanced Practice Providers (APPs -  Physician Assistants and Nurse Practitioners) who all work together to provide you with the care you need, when you need it.   Your next appointment:   2 week(s) after your cardioversion on   The format for your next appointment:   In Person  Provider:   You will follow up in the Atrial Fibrillation Clinic located at [facility]. Your provider will be: Rudi Coco, NP or Clint R. Fenton, PA-C{    Thank you for choosing [facility]!!   Dory Horn, RN [phone]  Other Instructions If your blood pressures remain elevated (greater than 140s/80s), please call the heart failure clinic to discuss   Amiodarone Tablets What is this medication? AMIODARONE (a MEE oh da rone) prevents and treats a fast or irregular heartbeat (arrhythmia). It works by slowing down overactive electric signals in the heart, which stabilizes your heart rhythm. It belongs to a group of medications called antiarrhythmics. This medicine may be used for other purposes; ask your health care provider or pharmacist if you have  questions. COMMON BRAND NAME(S): Cordarone, Pacerone What should I tell my care team before I take this medication? They need to know if you have any of these conditions: Liver disease Lung disease Other heart problems Thyroid disease An unusual or allergic reaction to amiodarone, iodine, other medications, foods, dyes, or preservatives Pregnant or trying to get pregnant Breast-feeding How should I use this medication? Take this medication by mouth with water. Take it as directed on the prescription label at the same time every day. You can take it with or without food. You should always take it the same way. Keep taking it unless your care team tells you to stop. A special MedGuide will be given to you by the pharmacist with each prescription and refill. Be sure to read this information carefully each time. Talk to your care team about the use of this medication in children. Special care may be needed. Overdosage: If you think you have taken too much of this medicine contact a poison control center or emergency room at once. NOTE: This medicine is only  for you. Do not share this medicine with others. What if I miss a dose? If you miss a dose, take it as soon as you can. If it is almost time for your next dose, take only that dose. Do not take double or extra doses. What may interact with this medication? Do not take this medication with any of the following: Abarelix Apomorphine Arsenic trioxide Certain antibiotics, such as erythromycin, gemifloxacin, levofloxacin, or pentamidine Certain medications for depression, such as amoxapine or tricyclic antidepressants Certain medications for fungal infections, such as fluconazole, itraconazole, ketoconazole, posaconazole, or voriconazole Certain medications for irregular heartbeat, such as disopyramide, dronedarone, ibutilide, propafenone, or sotalol Certain medications for malaria, such as chloroquine or  halofantrine Cisapride Droperidol Haloperidol [place] Maprotiline Methadone Phenothiazines, such as chlorpromazine, mesoridazine, or thioridazine Pimozide Ranolazine Red yeast rice Vardenafil This medication may also interact with the following: Antivirals for HIV Certain medications for blood pressure, heart disease, irregular heartbeat Certain medications for cholesterol, such as atorvastatin, cerivastatin, lovastatin, or simvastatin Certain medications for hepatitis C, such as sofosbuvir and ledipasvir; sofosbuvir Certain medications for seizures, such as phenytoin Certain medications for thyroid problems Certain medications that prevent or treat blood clots, such as warfarin Cholestyramine Cimetidine Clopidogrel Cyclosporine Dextromethorphan Diuretics Dofetilide Fentanyl General anesthetics Grapefruit juice Lidocaine Loratadine Methotrexate Other medications that cause heart rhythm changes Procainamide Quinidine Rifabutin, rifampin, or rifapentine St. John's Wort Trazodone Ziprasidone This list may not describe all possible interactions. Give your health care provider a list of all the medicines, herbs, non-prescription drugs, or dietary supplements you use. Also tell them if you smoke, drink alcohol, or use illegal drugs. Some items may interact with your medicine. What should I watch for while using this medication? Your condition will be monitored closely when you first begin therapy. This medication is often started in a hospital or other monitored health care setting. Once you are on maintenance therapy, visit your care team for regular checks on your progress. Because your condition and use of this medication carry some risk, it is a good idea to carry an identification card, necklace, or bracelet with details of your condition, medications, and care team. This medication may affect your coordination, reaction time, or judgment. Do not drive or operate machinery  until you know how this medication affects you. Sit up or stand slowly to reduce the risk of dizzy or fainting spells. Drinking alcohol with this medication can increase the risk of these side effects. This medication can make you more sensitive to the sun. Keep out of the sun. If you cannot avoid being in the sun, wear protective clothing and sunscreen. Do not use sun lamps, tanning beds, or tanning booths. You should have regular eye exams before and during treatment. Call your care team if you have blurred vision, see halos, or your eyes become sensitive to light. Your eyes may get dry. It may be helpful to use a lubricating eye solution or artificial tears solution. If you are going to have surgery or a procedure that requires contrast dyes, tell your care team that you are taking this medication. What side effects may I notice from receiving this medication? Side effects that you should report to your care team as soon as possible: Allergic reactions--skin rash, itching, hives, swelling of the face, lips, tongue, or throat Bluish-gray skin Change in vision such as blurry vision, seeing halos around lights, vision loss Heart failure--shortness of breath, swelling of the ankles, feet, or hands, sudden weight gain, unusual weakness or fatigue Heart  rhythm changes--fast or irregular heartbeat, dizziness, feeling faint or lightheaded, chest pain, trouble breathing High thyroid levels (hyperthyroidism)--fast or irregular heartbeat, weight loss, excessive sweating or sensitivity to heat, tremors or shaking, anxiety, nervousness, irregular menstrual cycle or spotting Liver injury--right upper belly pain, loss of appetite, nausea, light-colored stool, dark yellow or brown urine, yellowing skin or eyes, unusual weakness or fatigue Low thyroid levels (hypothyroidism)--unusual weakness or fatigue, sensitivity to cold, constipation, hair loss, dry skin, weight gain, feelings of depression Lung  injury--shortness of breath or trouble breathing, cough, spitting up blood, chest pain, fever Pain, tingling, or numbness in the hands or feet, muscle weakness, trouble walking, loss of balance or coordination Side effects that usually do not require medical attention (report to your care team if they continue or are bothersome): Nausea Vomiting This list may not describe all possible side effects. Call your doctor for medical advice about side effects. You may report side effects to FDA at 1-800-FDA-1088. Where should I keep my medication? Keep out of the reach of children and pets. Store at room temperature between 20 and 25 degrees C (68 and 77 degrees F). Protect from light. Keep container tightly closed. Throw away any unused medication after the expiration date. NOTE: This sheet is a summary. It may not cover all possible information. If you have questions about this medicine, talk to your doctor, pharmacist, or health care provider.  2023 Elsevier/Gold Standard (2020-11-27 00:00:00)

## 2022-11-04 LAB — PRO B NATRIURETIC PEPTIDE: NT-Pro BNP: 5609 pg/mL — ABNORMAL HIGH (ref 0–376)

## 2022-11-22 ENCOUNTER — Encounter (HOSPITAL_COMMUNITY): Payer: Self-pay | Admitting: Certified Registered"

## 2022-11-22 ENCOUNTER — Ambulatory Visit (HOSPITAL_COMMUNITY): Admission: RE | Admit: 2022-11-22 | Payer: Medicare Other | Source: Home / Self Care | Admitting: Cardiovascular Disease

## 2022-11-22 ENCOUNTER — Encounter (HOSPITAL_COMMUNITY): Admission: RE | Payer: Self-pay | Source: Home / Self Care

## 2022-11-22 DIAGNOSIS — I4891 Unspecified atrial fibrillation: Secondary | ICD-10-CM

## 2022-11-22 SURGERY — CARDIOVERSION
Anesthesia: Monitor Anesthesia Care

## 2022-11-22 NOTE — Anesthesia Preprocedure Evaluation (Signed)
Anesthesia Evaluation    Reviewed: Allergy & Precautions, Patient's Chart, lab work & pertinent test results, reviewed documented beta blocker date and time   History of Anesthesia Complications Negative for: history of anesthetic complications  Airway        Dental   Pulmonary former smoker          Cardiovascular hypertension, Pt. on medications and Pt. on home beta blockers +CHF  + dysrhythmias Atrial Fibrillation   TTE 10/19/21: EF 30-35%, moderate LVE, moderate LVH, grade 2 DD, mild MR, mild AR, aneurysm of the ascending aorta measuring 17mm     Neuro/Psych negative neurological ROS  negative psych ROS   GI/Hepatic negative GI ROS,,,(+)     substance abuse  cocaine use  Endo/Other  negative endocrine ROS    Renal/GU Renal InsufficiencyRenal disease  negative genitourinary   Musculoskeletal negative musculoskeletal ROS (+)    Abdominal   Peds  Hematology negative hematology ROS (+)   Anesthesia Other Findings Day of surgery medications reviewed with patient.  Reproductive/Obstetrics negative OB ROS                             Anesthesia Physical Anesthesia Plan  ASA: 3  Anesthesia Plan: General   Post-op Pain Management: Minimal or no pain anticipated   Induction: Intravenous  PONV Risk Score and Plan: 2 and Treatment may vary due to age or medical condition and Propofol infusion  Airway Management Planned: Mask  Additional Equipment: None  Intra-op Plan:   Post-operative Plan:   Informed Consent:   Plan Discussed with:   Anesthesia Plan Comments:        Anesthesia Quick Evaluation

## 2022-11-24 ENCOUNTER — Encounter (HOSPITAL_COMMUNITY): Payer: Self-pay | Admitting: *Deleted

## 2022-11-29 ENCOUNTER — Encounter: Payer: Self-pay | Admitting: Thoracic Surgery (Cardiothoracic Vascular Surgery)

## 2022-11-29 ENCOUNTER — Other Ambulatory Visit: Payer: Medicare Other

## 2022-11-29 ENCOUNTER — Ambulatory Visit: Payer: Medicare Other | Admitting: Thoracic Surgery (Cardiothoracic Vascular Surgery)

## 2022-11-29 NOTE — Progress Notes (Incomplete)
ADVANCED HF CLINIC NOTE  Primary Care: Jeanie Sewer, NP HF Cardiologist: Dr. Haroldine Laws  HPI: [name] is a 67 y.o. male w/ history of poorly controlled HTN, ETOH use, tobacco use and cocaine use, and recently diagnosed with HFrEF.   Admitted to [facility] 1/23 with Acute HFrEF.  Echo showed moderately reduced LVEF 30 to 35%, mod LVH w/ GIIDD, normal RV. Chest CT also showed thoracic aortic aneurysm. Aortic root measures 4.7 to 4.9 cm. Coronary atherosclerosis also noted. UDS negative. Diuresed w/ IV Lasix. Did not undergo LHC given renal insufficiency, which also limited medical therapy. SCr 2.2 (baseline unknown). No ARNi/ARB/spiro/dig. Placed on Imdur/hydarl, coreg and farxiga. D/c wt 249 lb.   He was seen in the HF [facility] 1/23 and referred to the [facility]. Hypertensive at the visit. Coreg and hydralazine increased. Also started on Farxiga.   Follow up 2/23 in AHF clinic, found to be in AF, rate controlled. Eliquis started and referred to AF clinic to discuss DCCV. Seen in AF clinic 2/23, planned 3 weeks of correct Eliquis use then arrange DCCV.   Lost to follow up. No show for follow up with Dr. Haroldine Laws 3/23.  Follow up 10/23, worse NYHA III and volume overloaded. Lasix increased to 80 daily. Eliquis restarted, had follow up with AF clinic to arrange DCCV after 3 weeks back on AC.  Today he returns for HF follow up. Overall feeling fine. He is not SOB walking on flat ground. Gets winded going up stairs. Denies palpitations, abnormal bleeding, CP, dizziness, edema, or PND/Orthopnea. Appetite ok. No fever or chills. Weight at home 258-260 pounds. Taking all medications. BP at home 160's. No longer smoking, no ETOH in 13 years, no cocaine in a couple months. Has home sleep study but has not completed yet.   Cardiac Testing   - Echo (10/19/21): EF 30-35%. Global HK, moderate ventricular hypertrophy, grade II DD, RV normal Aneurysm of the ascending aorta, measuring 43  mm.   Past Medical History:  Diagnosis Date   Acute congestive heart failure ([employer]) 10/19/2021   Ascending aortic aneurysm ([place]) 11/23/2021   4.9 cm in January 2023   Chronic kidney disease, stage 3a ([place]) 10/19/2021   Cocaine abuse ([place]) 10/19/2021   Elevated troponin level not due myocardial infarction 10/19/2021   Essential hypertension 10/19/2021   Nicotine dependence, cigarettes, uncomplicated [id number]   Current Outpatient Medications  Medication Sig Dispense Refill   amiodarone (PACERONE) 200 MG tablet Take 2 tablets (400 mg total) TWICE a day for 2 weeks, then take 1 tablet (200 mg total) TWICE a day for 2 weeks, then take 1 tablet (200 mg total) ONCE a day 90 tablet 1   apixaban (ELIQUIS) 5 MG TABS tablet Take 1 tablet (5 mg total) by mouth 2 (two) times daily. 14 tablet 0   carvedilol (COREG) 12.5 MG tablet Take 1 tablet (12.5 mg total) by mouth 2 (two) times daily with a meal. 60 tablet 3   dapagliflozin propanediol (FARXIGA) 10 MG TABS tablet Take 1 tablet (10 mg total) by mouth daily. 30 tablet 3   furosemide (LASIX) 40 MG tablet Take 2 tablets (80 mg total) by mouth daily. 60 tablet 3   hydrALAZINE (APRESOLINE) 100 MG tablet Take 1 tablet (100 mg total) by mouth 3 (three) times daily. 90 tablet 3   isosorbide mononitrate (IMDUR) 60 MG 24 hr tablet Take 1 tablet (60 mg total) by mouth daily. 30 tablet 3   Multiple Vitamin (MULTIVITAMIN) capsule Take  1 capsule by mouth as needed. Centrum Silver for men     nitroGLYCERIN (NITROSTAT) 0.4 MG SL tablet Place 1 tablet (0.4 mg total) under the tongue every 5 (five) minutes as needed for chest pain. 30 tablet 12   potassium chloride SA (KLOR-CON M) 20 MEQ tablet Take 1 tablet (20 mEq total) by mouth daily. 30 tablet 3   No current facility-administered medications for this visit.   No Known Allergies  Social History   Socioeconomic History   Marital status: Divorced    Spouse name: Not on file   Number of children: 2   Years of education:  Not on file   Highest education level: Associate degree: academic program  Occupational History   Occupation: retired  Tobacco Use   Smoking status: Former    Packs/day: 1.00    Years: 20.00    Total pack years: 20.00    Types: Cigarettes    Quit date: 10/17/2021    Years since quitting: 1.1   Smokeless tobacco: Never  Vaping Use   Vaping Use: Never used  Substance and Sexual Activity   Alcohol use: Not Currently    Comment: heavy drinker x 7 years, quit 11 years ago.   Drug use: Not Currently    Types: Cocaine    Comment: 1-2x/mo last use 10/09/2021   Sexual activity: Not Currently  Other Topics Concern   Not on file  Social History Narrative   Not on file   Social Determinants of Health   Financial Resource Strain: Low Risk  (07/25/2022)   Overall Financial Resource Strain (CARDIA)    Difficulty of Paying Living Expenses: Not very hard  Food Insecurity: No Food Insecurity (07/25/2022)   Hunger Vital Sign    Worried About Running Out of Food in the Last Year: Never true    Ran Out of Food in the Last Year: Never true  Transportation Needs: No Transportation Needs (07/25/2022)   PRAPARE - Hydrologist (Medical): No    Lack of Transportation (Non-Medical): No  Physical Activity: Inactive (07/25/2022)   Exercise Vital Sign    Days of Exercise per Week: 0 days    Minutes of Exercise per Session: 0 min  Stress: No Stress Concern Present (07/25/2022)   [place]    Feeling of Stress : Only a little  Social Connections: Socially Isolated (07/25/2022)   Social Connection and Isolation Panel [NHANES]    Frequency of Communication with Friends and Family: More than three times a week    Frequency of Social Gatherings with Friends and Family: More than three times a week    Attends Religious Services: Never    Marine scientist or Organizations: No    Attends Archivist  Meetings: Never    Marital Status: Divorced  Human resources officer Violence: Not At Risk (07/25/2022)   Humiliation, Afraid, Rape, and Kick questionnaire    Fear of Current or Ex-Partner: No    Emotionally Abused: No    Physically Abused: No    Sexually Abused: No   Family History  Problem Relation Age of Onset   Heart disease Neg Hx    There were no vitals taken for this visit.  Wt Readings from Last 3 Encounters:  11/03/22 123.4 kg (272 lb)  09/22/22 121.7 kg (268 lb 6.4 oz)  09/01/22 118.7 kg (261 lb 9.6 oz)   PHYSICAL EXAM: General:  NAD. No resp difficulty,  walked into clinic HEENT: Normal Neck: Supple. No JVD. Carotids 2+ bilat; no bruits. No lymphadenopathy or thryomegaly appreciated. Cor: PMI nondisplaced. Irregular rate & rhythm. No rubs, gallops or murmurs. Lungs: Clear Abdomen: Soft, nontender, nondistended. No hepatosplenomegaly. No bruits or masses. Good bowel sounds. Extremities: No cyanosis, clubbing, rash, edema Neuro: Alert & oriented x 3, cranial nerves grossly intact. Moves all 4 extremities w/o difficulty. Affect pleasant.  ASSESSMENT & PLAN: Chronic Systolic Heart Failure -  Echo 1/23: EF 30 to 35%, mod LVH w/ GIIDD, normal RV (no prior study for comparison) -  Etiology uncertain. 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 He will need cath if renal fx improves (chest CT + coronary calcifications, multiple RFs, ETOH and cocaine may also contribute) - NYHA II, volume better today. - Increase hydralazine to 100 mg tid. - Continue Imdur 60 mg daily. - Continue Lasix 80 mg daily + 20 KCL daily. - Continue carvedilol 12.5 mg bid.  - Continue Farxiga 10 mg daily.  - No ARNi/ARB/Spiro/dig w/ CKD - Labs today.  - Repeat echo.  2. Atrial fibrillation, persistent  - Rate controlled. - CHA2DS2-VASc Score: at least 3  - Continue Eliquis 5 mg bid. - Needs to complete home sleep study. - Planning on DCCV 09/07/22.   3. Hypertension  - Long h/o poor  control. - Elevated today. - Increase hydralazine as above.   4. CKD3 - Baseline SCr 1.8-2 - ? Hypertensive nephropathy. BP control per above.  - Continue Farxiga 10 mg daily. - BMET today.   5. Thoracic aortic aneurysm - aortic root measured 4.7 to 4.9 cm on noncontrast chest CT 1/23. - Had follow up with Dr. Skipper Cliche, planning repeat CT chest in 6 months. - Needs tight BP control - Previously discussed adding statin, he declined. - Check lipids today.  6. Cocaine abuse - Last used 2 months ago - Advised complete cessation.  7. H/o Tobacco abuse - remains wuit from cigarettes - Congratulated.  8. Snoring - He has home sleep study, needs to complete this.  Follow up in 3 months with Dr. Haroldine Laws + echo.  Allena Katz, FNP-BC 11/29/22

## 2022-11-30 ENCOUNTER — Encounter (HOSPITAL_COMMUNITY): Payer: Medicare Other

## 2022-11-30 ENCOUNTER — Ambulatory Visit (HOSPITAL_COMMUNITY): Payer: Medicare Other | Attending: Family

## 2022-12-06 ENCOUNTER — Encounter (HOSPITAL_COMMUNITY): Payer: Self-pay

## 2022-12-06 ENCOUNTER — Ambulatory Visit (HOSPITAL_COMMUNITY): Payer: Medicare Other | Admitting: Physician Assistant

## 2022-12-07 ENCOUNTER — Encounter (HOSPITAL_COMMUNITY): Payer: Self-pay

## 2022-12-08 ENCOUNTER — Encounter (HOSPITAL_COMMUNITY): Payer: Self-pay

## 2022-12-08 ENCOUNTER — Ambulatory Visit (HOSPITAL_COMMUNITY): Payer: Medicare Other | Admitting: Physician Assistant

## 2022-12-08 NOTE — Progress Notes (Incomplete)
Primary Care Physician: Jeanie Sewer, NP Referring Physician: Allena Katz, FNP   Timothy Moon is a 67 y.o. male with a h/o of poorly controlled HTN, ETOH use, tobacco use and cocaine use, and recently diagnosed with HFrEF.   Admitted to [facility] 1/23 with Acute HFrEF.  Echo showed moderately reduced LVEF 30 to 35%, mod LVH w/ GIIDD, normal RV. Chest CT also showed thoracic aortic aneurysm. Aortic root measures 4.7 to 4.9 cm. Coronary atherosclerosis also noted. UDS negative. Diuresed w/ IV Lasix. Did not undergo LHC given renal insufficiency, which also limited medical therapy. SCr 2.2 (baseline unknown). No ARNi/ARB/spiro/dig. Placed on Imdur/hydarl, coreg and farxiga. D/c wt 249 lb.   He was seen in the HF [facility] 1/23 and referred to the [facility] Failure [facility]. Hypertensive at the visit. Coreg and hydralazine increased. Also started on farxiga. - Echo 10/19/21 EF 30-35%. Global HK, moderate ventricular hypertrophy, grade II DD, RV normal Aneurysm of the ascending aorta, measuring 43 mm.   He was seen by Allena Katz, FNP, 11/24/21 and was started on eliquis 5 mg bid and referred to afib clinic to discuss cardioversion. However, he states today that he has only been taking  eliquis 1x daily since 2/9 . His HR's at home in afib  are in the 60-80's.   He is here with his friend today. He states no recent cocaine. Denies alcohol use. He does not appear to be symptomatic with the afib. States compliance with BB. Recently increased.   F/u in afib clinic, 08/11/22. I have not seen since February at which time he was suppose to see me back in 2 weeks to have a cardioversion. He missed Dr. Clayborne Dana visit around that time as well. He recently aw Allena Katz, NP, 10/24 and was still in afib. He is here now to see about obtaining cardioversion. In the meantime, pt states that he has been feeling all right. He has a rx for eliquis thru the mail coming to him, but he was given samples today to  start back tonight. After 21 days on anticoagulation, we discussed going on to have a cardioversion. He is in agreement.   F/u in afib clinic, pt states that he has been back on eliquis 5 mg bid since 10/27. No missed doses since then. He remains in afib at 111 bpm. Fluid status stable. He wants to proceed with cardioversion.   F/u  afib clinic, 09/22/22 after successful cardioversion from 09/07/22 but unfortunately ERAF. I will refer to EP to be considered for ablation. EKG shows Afib at 96 bpm.   Follow up in the AF clinic 12/08/22. Patient was seen by Dr Curt Bears and loaded on amiodarone with plan for DCCV. He chemically converted with the medication and DCCV was cancelled. ***   Today, he denies symptoms of ***palpitations, chest pain, shortness of breath, orthopnea, PND, lower extremity edema, dizziness, presyncope, syncope, or neurologic sequela. The patient is tolerating medications without difficulties and is otherwise without complaint today.   Past Medical History:  Diagnosis Date   Acute congestive heart failure ([place]) 10/19/2021   Ascending aortic aneurysm ([place]) 11/23/2021   4.9 cm in January 2023   Chronic kidney disease, stage 3a ([place]) 10/19/2021   Cocaine abuse ([place]) 10/19/2021   Elevated troponin level not due myocardial infarction 10/19/2021   Essential hypertension 10/19/2021   Nicotine dependence, cigarettes, uncomplicated [id number]   Past Surgical History:  Procedure Laterality Date   CARDIOVERSION N/A 09/07/2022   Procedure: CARDIOVERSION;  Surgeon: Johney Frame,  Greer Ee, MD;  Location: [facility];  Service: Cardiovascular;  Laterality: N/A;    Current Outpatient Medications  Medication Sig Dispense Refill   amiodarone (PACERONE) 200 MG tablet Take 2 tablets (400 mg total) TWICE a day for 2 weeks, then take 1 tablet (200 mg total) TWICE a day for 2 weeks, then take 1 tablet (200 mg total) ONCE a day 90 tablet 1   apixaban (ELIQUIS) 5 MG TABS tablet Take 1 tablet (5 mg total) by  mouth 2 (two) times daily. 14 tablet 0   carvedilol (COREG) 12.5 MG tablet Take 1 tablet (12.5 mg total) by mouth 2 (two) times daily with a meal. 60 tablet 3   dapagliflozin propanediol (FARXIGA) 10 MG TABS tablet Take 1 tablet (10 mg total) by mouth daily. 30 tablet 3   furosemide (LASIX) 40 MG tablet Take 2 tablets (80 mg total) by mouth daily. 60 tablet 3   hydrALAZINE (APRESOLINE) 100 MG tablet Take 1 tablet (100 mg total) by mouth 3 (three) times daily. 90 tablet 3   isosorbide mononitrate (IMDUR) 60 MG 24 hr tablet Take 1 tablet (60 mg total) by mouth daily. 30 tablet 3   Multiple Vitamin (MULTIVITAMIN) capsule Take 1 capsule by mouth as needed. Centrum Silver for men     nitroGLYCERIN (NITROSTAT) 0.4 MG SL tablet Place 1 tablet (0.4 mg total) under the tongue every 5 (five) minutes as needed for chest pain. 30 tablet 12   potassium chloride SA (KLOR-CON M) 20 MEQ tablet Take 1 tablet (20 mEq total) by mouth daily. 30 tablet 3   No current facility-administered medications for this visit.    No Known Allergies  Social History   Socioeconomic History   Marital status: Divorced    Spouse name: Not on file   Number of children: 2   Years of education: Not on file   Highest education level: Associate degree: academic program  Occupational History   Occupation: retired  Tobacco Use   Smoking status: Former    Packs/day: 1.00    Years: 20.00    Total pack years: 20.00    Types: Cigarettes    Quit date: 10/17/2021    Years since quitting: 1.1   Smokeless tobacco: Never  Vaping Use   Vaping Use: Never used  Substance and Sexual Activity   Alcohol use: Not Currently    Comment: heavy drinker x 7 years, quit 11 years ago.   Drug use: Not Currently    Types: Cocaine    Comment: 1-2x/mo last use 10/09/2021   Sexual activity: Not Currently  Other Topics Concern   Not on file  Social History Narrative   Not on file   Social Determinants of Health   Financial Resource Strain:  Low Risk  (07/25/2022)   Overall Financial Resource Strain (CARDIA)    Difficulty of Paying Living Expenses: Not very hard  Food Insecurity: No Food Insecurity (07/25/2022)   Hunger Vital Sign    Worried About Running Out of Food in the Last Year: Never true    Ran Out of Food in the Last Year: Never true  Transportation Needs: No Transportation Needs (07/25/2022)   PRAPARE - Hydrologist (Medical): No    Lack of Transportation (Non-Medical): No  Physical Activity: Inactive (07/25/2022)   Exercise Vital Sign    Days of Exercise per Week: 0 days    Minutes of Exercise per Session: 0 min  Stress: No Stress Concern Present (07/25/2022)  [place] Questionnaire    Feeling of Stress : Only a little  Social Connections: Socially Isolated (07/25/2022)   Social Connection and Isolation Panel [NHANES]    Frequency of Communication with Friends and Family: More than three times a week    Frequency of Social Gatherings with Friends and Family: More than three times a week    Attends Religious Services: Never    Marine scientist or Organizations: No    Attends Archivist Meetings: Never    Marital Status: Divorced  Human resources officer Violence: Not At Risk (07/25/2022)   Humiliation, Afraid, Rape, and Kick questionnaire    Fear of Current or Ex-Partner: No    Emotionally Abused: No    Physically Abused: No    Sexually Abused: No    Family History  Problem Relation Age of Onset   Heart disease Neg Hx     ROS- All systems are reviewed and negative except as per the HPI above  Physical Exam: There were no vitals filed for this visit.  Wt Readings from Last 3 Encounters:  11/03/22 123.4 kg  09/22/22 121.7 kg  09/01/22 118.7 kg    Labs: Lab Results  Component Value Date   NA 143 09/07/2022   K 3.8 09/07/2022   CL 109 09/07/2022   CO2 26 09/07/2022   GLUCOSE 106 (H) 09/07/2022   BUN 32 (H)  09/07/2022   CREATININE 2.00 (H) 09/07/2022   CALCIUM 9.4 09/07/2022   PHOS 3.7 10/20/2021   MG 2.5 (H) 10/20/2021   No results found for: "INR" Lab Results  Component Value Date   CHOL 121 09/01/2022   HDL 32 (L) 09/01/2022   LDLCALC 68 09/01/2022   TRIG 106 09/01/2022     GEN- The patient is a well appearing *** {Desc; male/male:11659}, alert and oriented x 3 today.   HEENT-head normocephalic, atraumatic, sclera clear, conjunctiva pink, hearing intact, trachea midline. Lungs- Clear to ausculation bilaterally, normal work of breathing Heart- ***Regular rate and rhythm, no murmurs, rubs or gallops  GI- soft, NT, ND, + BS Extremities- no clubbing, cyanosis, or edema MS- no significant deformity or atrophy Skin- no rash or lesion Psych- euthymic mood, full affect Neuro- strength and sensation are intact    EKG today- ***   CHA2DS2-VASc Score =    The patient's score is based upon:   {Click here to calculate score.  REFRESH note before signing. :1}     ASSESSMENT AND PLAN: {Select the correct AFib Diagnosis                 :[id number]   Assessment and Plan:  1. Persistent afib  *** Continue amiodarone 200 mg daily Continue carvedilol 12.5 mg BID Continue Eliquis 5 mg BID   3. Chronic systolic CHF EF [id number] Fluid status appears stable today. Followed in the [facility] ***  4. Ascending aortic aneurysm Followed by Dr. Roxan Hockey    Follow up ***with Dr Curt Bears in 3 months.    [facility] [address] [place], [place] [address] [phone]

## 2022-12-09 ENCOUNTER — Encounter (HOSPITAL_COMMUNITY): Payer: Self-pay | Admitting: Cardiology

## 2022-12-30 ENCOUNTER — Telehealth (HOSPITAL_COMMUNITY): Payer: Self-pay | Admitting: Cardiology

## 2022-12-30 NOTE — Telephone Encounter (Signed)
Just an FYI. We have made several attempts to contact this patient including sending a letter to schedule or reschedule their CARDIAC PET CT . We will be removing the patient from the [facility].   12/09/22 MAILED LETTER [facility]  The phone number on file is not correct. Contacted office to get an update.       Thank you

## 2023-01-11 ENCOUNTER — Encounter: Payer: Self-pay | Admitting: *Deleted

## 2023-01-11 ENCOUNTER — Telehealth: Payer: Self-pay | Admitting: *Deleted

## 2023-01-11 NOTE — Telephone Encounter (Signed)
Mailed pt letter, to home address on file, asking him to call office to discuss scheduling cardiac PET.

## 2023-06-06 ENCOUNTER — Other Ambulatory Visit (HOSPITAL_COMMUNITY): Payer: Self-pay

## 2023-08-23 ENCOUNTER — Other Ambulatory Visit (HOSPITAL_COMMUNITY): Payer: Self-pay

## 2023-08-23 ENCOUNTER — Other Ambulatory Visit: Payer: Self-pay

## 2023-08-23 ENCOUNTER — Other Ambulatory Visit (HOSPITAL_COMMUNITY): Payer: Self-pay | Admitting: Family Medicine

## 2023-08-23 MED ORDER — CARVEDILOL 12.5 MG PO TABS
12.5000 mg | ORAL_TABLET | Freq: Two times a day (BID) | ORAL | 0 refills | Status: DC
  Filled 2023-08-23: qty 60, 30d supply, fill #0
  Filled 2023-09-29: qty 60, 30d supply, fill #1
  Filled 2023-12-08: qty 60, 30d supply, fill #2

## 2023-08-23 MED ORDER — ISOSORBIDE MONONITRATE ER 60 MG PO TB24
60.0000 mg | ORAL_TABLET | Freq: Every day | ORAL | 0 refills | Status: DC
  Filled 2023-08-23: qty 30, 30d supply, fill #0
  Filled 2023-09-29: qty 30, 30d supply, fill #1
  Filled 2023-12-08: qty 30, 30d supply, fill #2

## 2023-08-23 MED ORDER — FUROSEMIDE 40 MG PO TABS
80.0000 mg | ORAL_TABLET | Freq: Every day | ORAL | 0 refills | Status: DC
  Filled 2023-08-23: qty 60, 30d supply, fill #0
  Filled 2023-09-29: qty 60, 30d supply, fill #1

## 2023-08-23 MED ORDER — POTASSIUM CHLORIDE CRYS ER 20 MEQ PO TBCR
20.0000 meq | EXTENDED_RELEASE_TABLET | Freq: Every day | ORAL | 0 refills | Status: DC
  Filled 2023-08-23: qty 30, 30d supply, fill #0
  Filled 2023-09-29: qty 30, 30d supply, fill #1
  Filled 2023-12-08: qty 30, 30d supply, fill #2

## 2023-08-24 ENCOUNTER — Other Ambulatory Visit (HOSPITAL_COMMUNITY): Payer: Self-pay

## 2023-09-29 ENCOUNTER — Other Ambulatory Visit: Payer: Self-pay | Admitting: Family

## 2023-09-29 ENCOUNTER — Other Ambulatory Visit (HOSPITAL_COMMUNITY): Payer: Self-pay | Admitting: Family Medicine

## 2023-09-30 ENCOUNTER — Other Ambulatory Visit (HOSPITAL_COMMUNITY): Payer: Self-pay

## 2023-09-30 ENCOUNTER — Other Ambulatory Visit: Payer: Self-pay

## 2023-10-02 ENCOUNTER — Other Ambulatory Visit (HOSPITAL_COMMUNITY): Payer: Self-pay

## 2023-10-02 ENCOUNTER — Other Ambulatory Visit: Payer: Self-pay

## 2023-10-02 MED ORDER — MULTIVITAMINS PO CAPS
1.0000 | ORAL_CAPSULE | ORAL | 0 refills | Status: AC | PRN
  Filled 2023-10-02: qty 90, fill #0

## 2023-10-02 MED ORDER — HYDRALAZINE HCL 100 MG PO TABS
100.0000 mg | ORAL_TABLET | Freq: Three times a day (TID) | ORAL | 0 refills | Status: DC
  Filled 2023-10-02: qty 90, 30d supply, fill #0

## 2023-10-03 ENCOUNTER — Other Ambulatory Visit (HOSPITAL_COMMUNITY): Payer: Self-pay

## 2023-10-25 IMAGING — DX DG CHEST 1V PORT
2 series · 2 of 2 positions shown · non-contrast
Comparison: None.

CLINICAL DATA: Shortness of breath for 2 days.

EXAM:
PORTABLE CHEST 1 VIEW

[chest ap (1 of 2)]
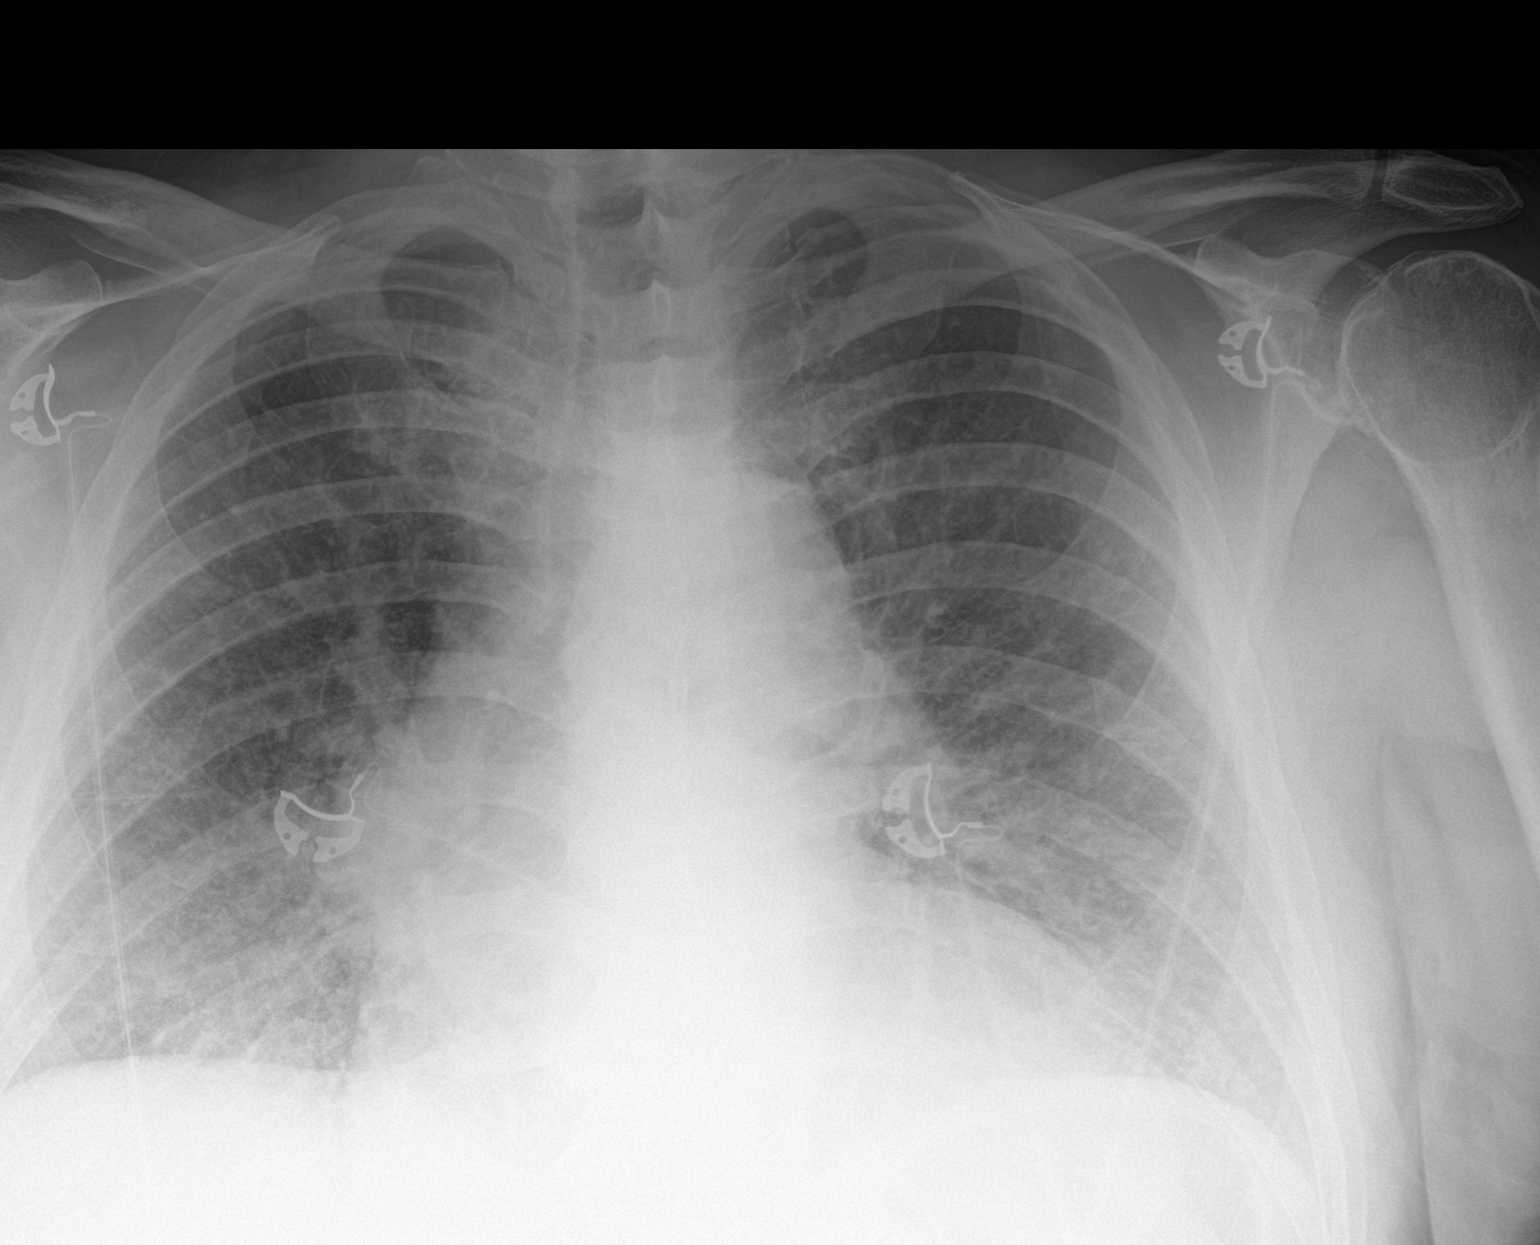

[chest ap (2 of 2)]
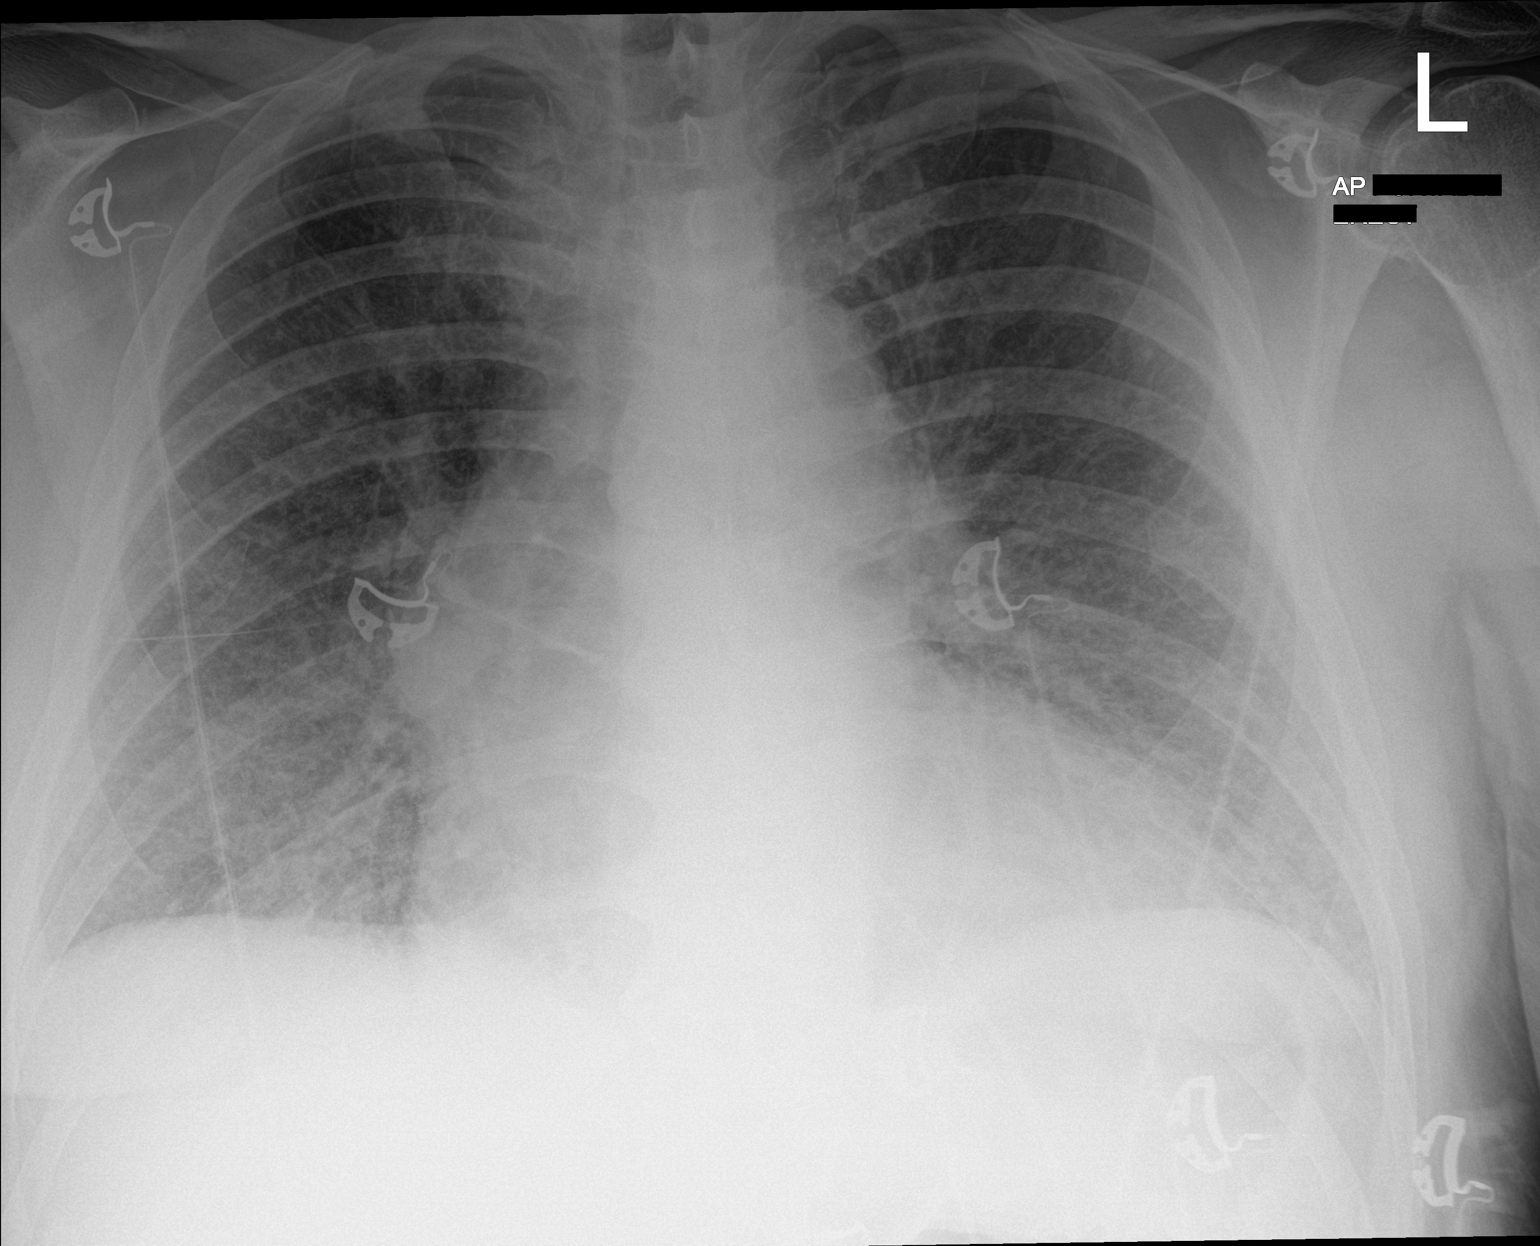

[2 of 2 positions shown; findings below may reference images not displayed]

FINDINGS: The heart is enlarged. Mild aortic tortuosity. Moderate
peribronchial and interstitial thickening suspicious for pulmonary
edema. Trace fluid in the right minor fissure. No large subpulmonic
effusion. No confluent consolidation. No pneumothorax. No acute
osseous abnormalities are seen.
IMPRESSION: Cardiomegaly with moderate pulmonary edema. Trace fluid in the right
minor fissure. Findings likely represent CHF.

## 2023-10-27 IMAGING — CT CT CHEST W/O CM
2 of 4 series · 15 of 36 positions shown, 18 images · non-contrast
Comparison: None.

CLINICAL DATA: 65-year-old male with aortic aneurysm.

EXAM:
CT CHEST WITHOUT CONTRAST
TECHNIQUE: Multidetector CT imaging of the chest was performed following the
standard protocol without IV contrast.

[Series 2: thorax · axial · 0.83mm/px · z∈[-428,-102]mm · 12 of 189 slices shown, 15 images]
[im 13/189  mediastinal]
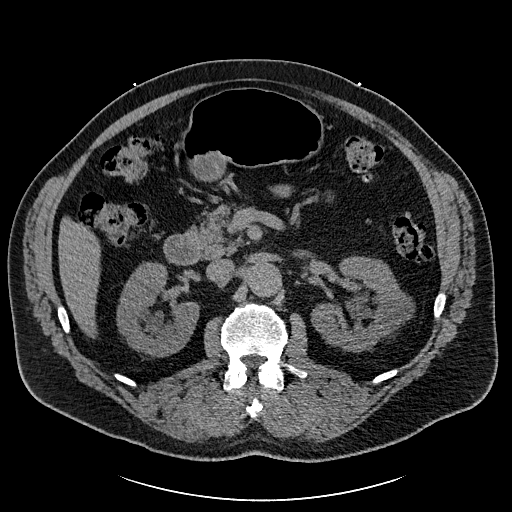
[im 13/189  lung]
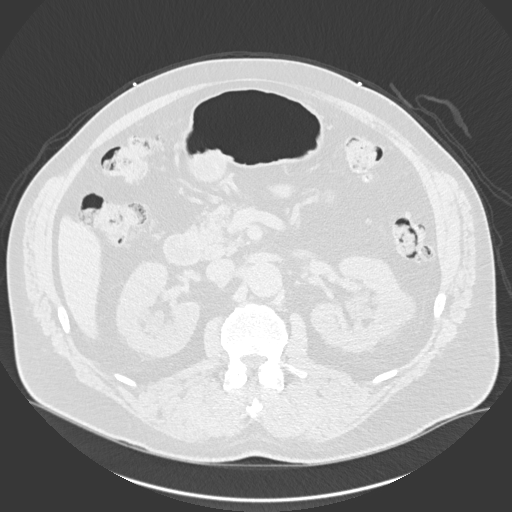
[im 26/189  lung]
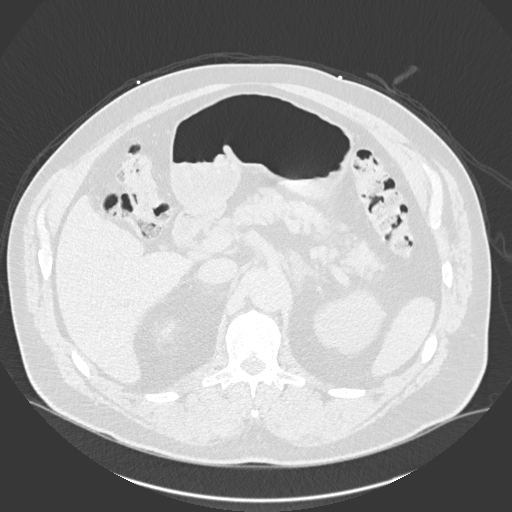
[im 38/189  lung]
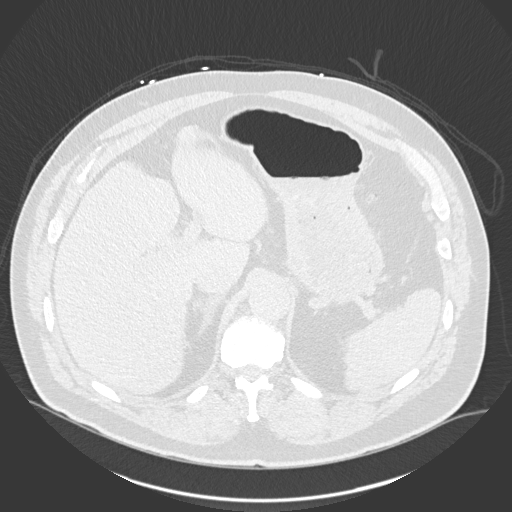
[im 63/189  lung]
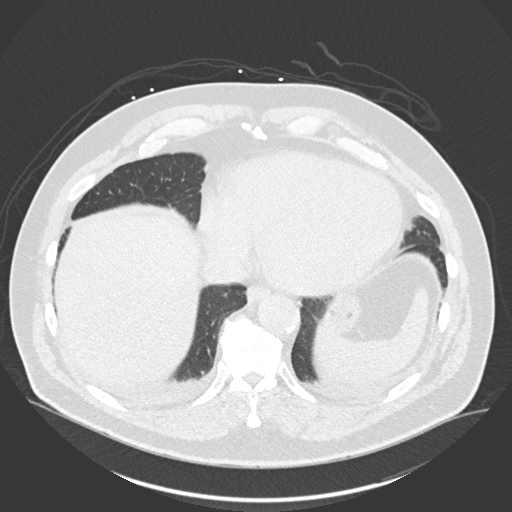
[im 76/189  mediastinal]
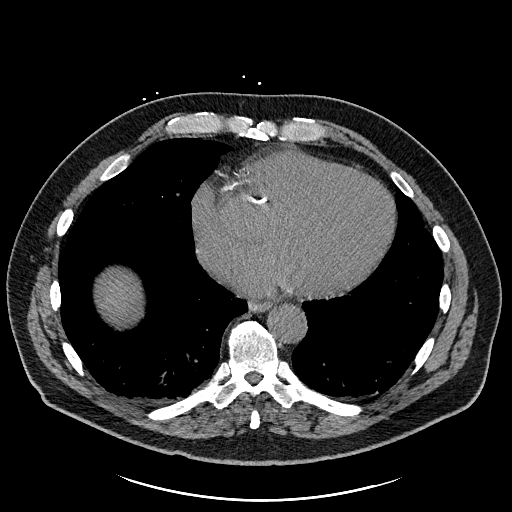
[im 76/189  lung]
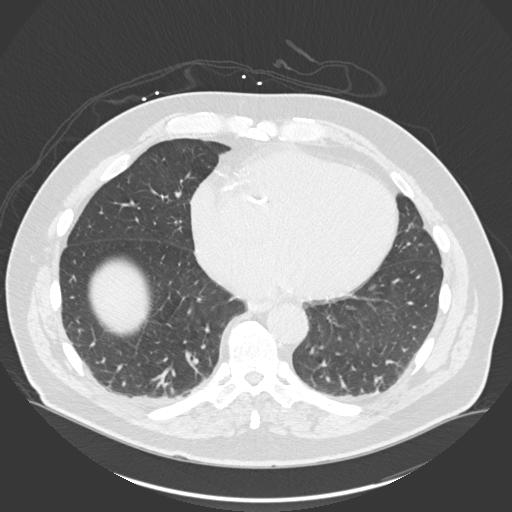
[im 88/189  lung]
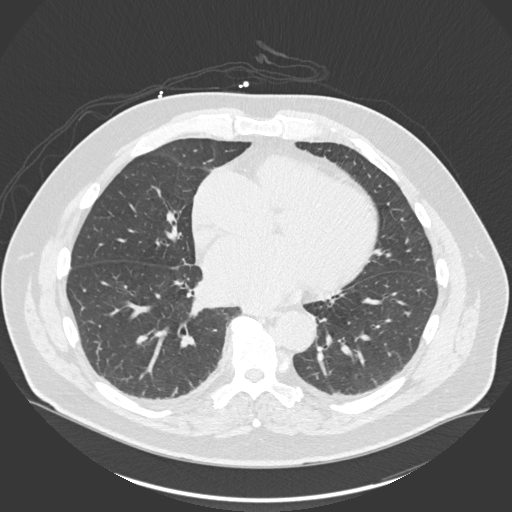
[im 101/189  lung]
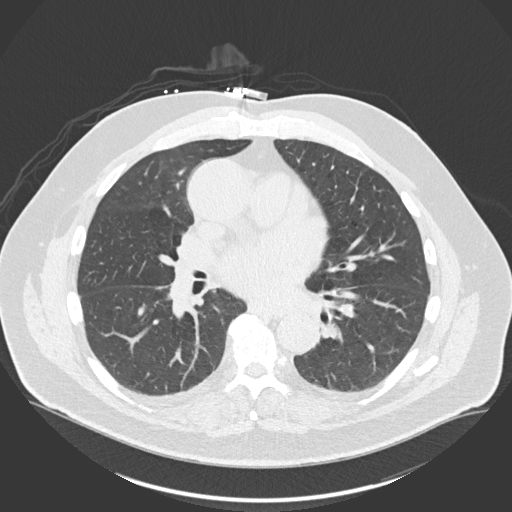
[im 113/189  lung]
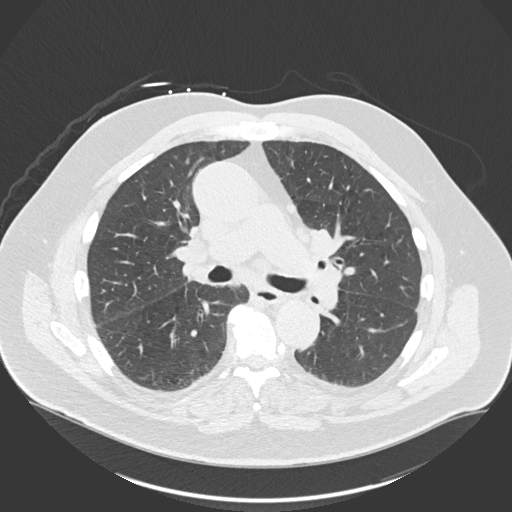
[im 126/189  mediastinal]
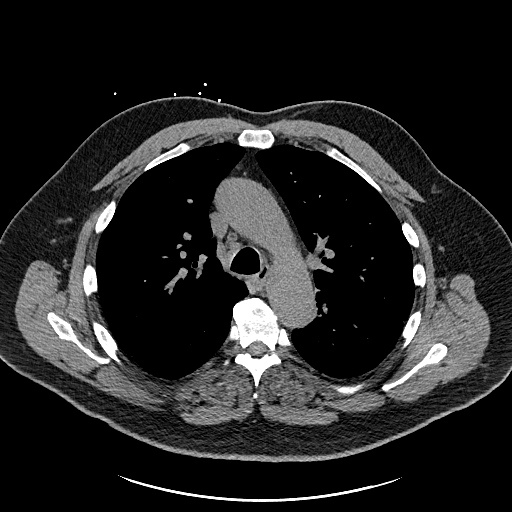
[im 126/189  lung]
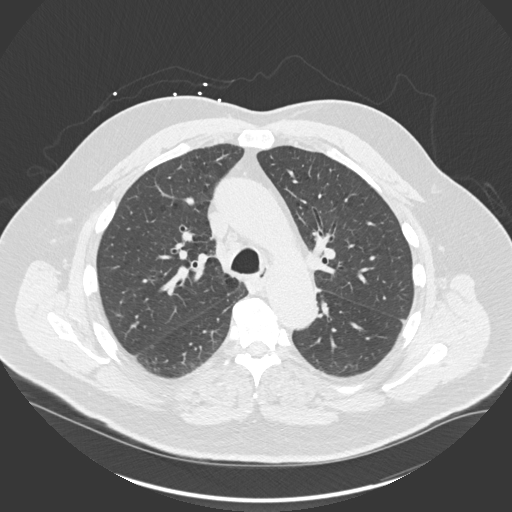
[im 151/189  lung]
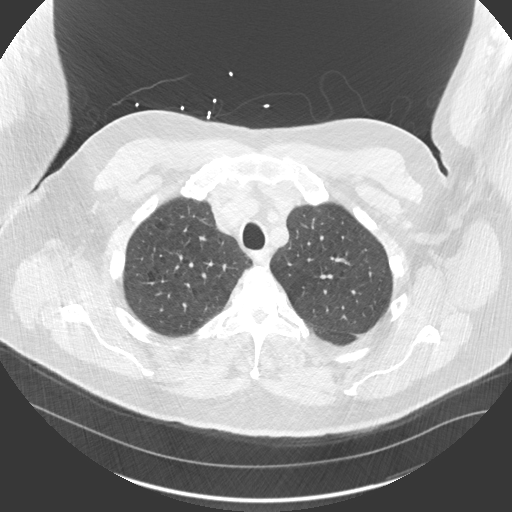
[im 163/189  lung]
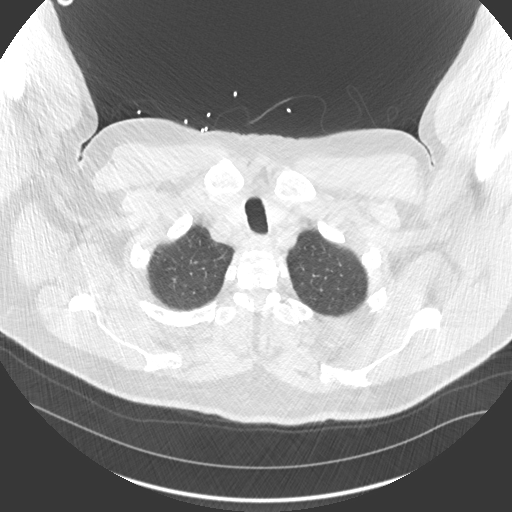
[im 176/189  lung]
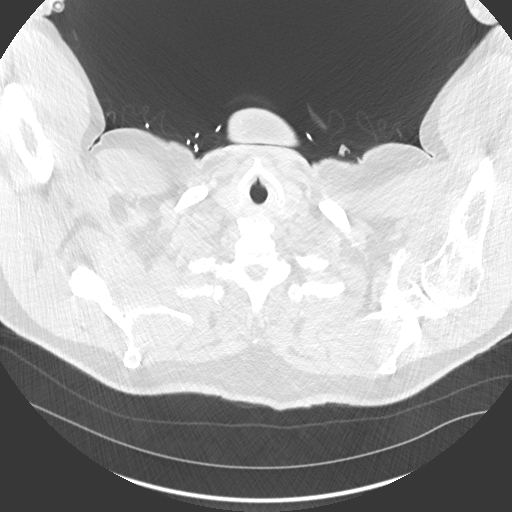

[Series 4: coronal · coronal · 0.71mm/px · 3 of 159 slices shown]
[im 32/159  lung]
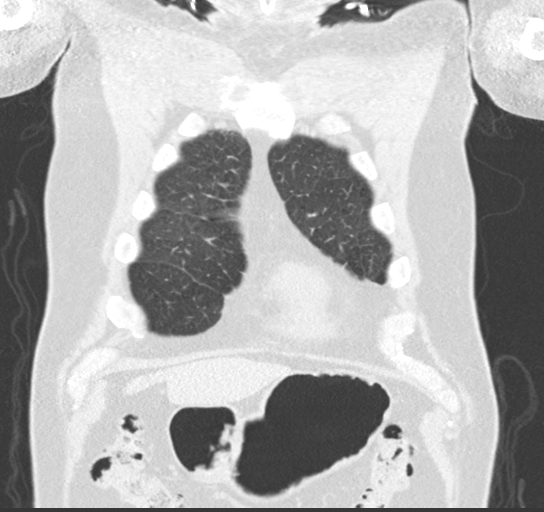
[im 64/159  lung]
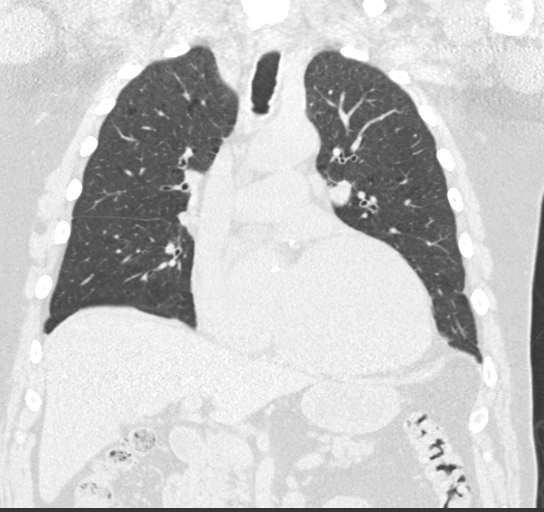
[im 95/159  lung]
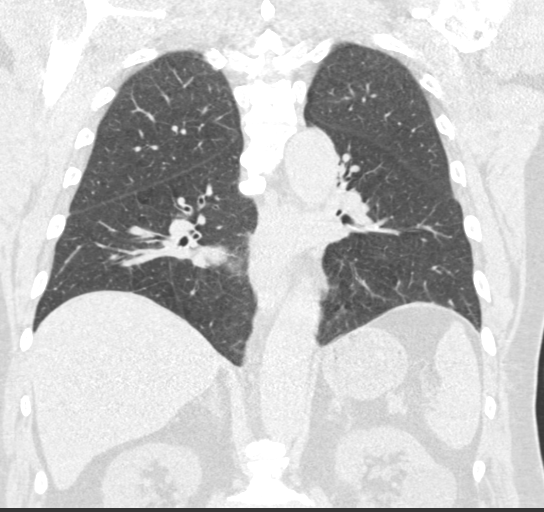

[15 of 36 positions shown; findings below may reference images not displayed]

FINDINGS: Cardiovascular: Calcified coronary artery atherosclerosis. Cardiac
size is at the upper limits of normal. No pericardial effusion.

Fusiform enlargement of the ascending thoracic aorta measuring 47-49
mm maximal diameter (series 2, image 91, series 4, image 46 and
series 5, image 92). The aorta tapers in the arch, and the
descending segment does not appear aneurysmal. Mild Calcified aortic
atherosclerosis. Vascular patency is not evaluated in the absence of
IV contrast.

Mediastinum/Nodes: Negative. No mediastinal mass or lymphadenopathy.

Lungs/Pleura: Major airways are patent. Upper lobe predominant
centrilobular emphysema is mild. Mild dependent atelectasis and
trace layering pleural effusions. Occasional subpleural lung
scarring.

Upper Abdomen: Negative visible noncontrast liver, gallbladder,
spleen, pancreas, adrenal glands and proximal bowel. Diverticulosis
of the visible colon. Multiple bilateral renal cysts, have a benign
appearance. Renal vascular calcifications at the hilum.

Musculoskeletal: Flowing anterior endplate osteophytes result in
interbody ankylosis in the midthoracic spine. No acute osseous
abnormality identified.
IMPRESSION: 1. Fusiform aneurysm of the ascending thoracic aorta measuring 47-49
mm maximal diameter.
Recommend semi-annual imaging followup by CTA or MRA and referral to
Cardiothoracic Surgery if not already obtained. This recommendation
follows 5868 ACCF/AHA/AATS/ACR/ASA/SCA/ABEY/MOLLINEDO/TAIWO/STANO Guidelines
for the Diagnosis and Management of Patients With Thoracic Aortic
Disease. Circulation. 5868; 121: E266-e369. Aortic aneurysm NOS
(OX8CY-LR5.M). Aortic Atherosclerosis (OX8CY-X9T.T).

2. Emphysema (OX8CY-0B3.L). Trace layering pleural effusions and
mild pulmonary atelectasis.

## 2023-12-08 ENCOUNTER — Other Ambulatory Visit (HOSPITAL_COMMUNITY): Payer: Self-pay

## 2023-12-08 ENCOUNTER — Other Ambulatory Visit: Payer: Self-pay

## 2023-12-08 ENCOUNTER — Other Ambulatory Visit (HOSPITAL_COMMUNITY): Payer: Self-pay | Admitting: Family Medicine

## 2023-12-08 MED ORDER — FUROSEMIDE 40 MG PO TABS
80.0000 mg | ORAL_TABLET | Freq: Every day | ORAL | 0 refills | Status: DC
  Filled 2023-12-08: qty 60, 30d supply, fill #0
  Filled 2024-01-10 – 2024-01-30 (×2): qty 30, 15d supply, fill #1

## 2023-12-08 MED ORDER — HYDRALAZINE HCL 100 MG PO TABS
100.0000 mg | ORAL_TABLET | Freq: Three times a day (TID) | ORAL | 0 refills | Status: DC
  Filled 2023-12-08: qty 90, 30d supply, fill #0

## 2023-12-11 ENCOUNTER — Other Ambulatory Visit (HOSPITAL_COMMUNITY): Payer: Self-pay

## 2024-01-03 ENCOUNTER — Other Ambulatory Visit (HOSPITAL_COMMUNITY): Payer: Self-pay | Admitting: Family Medicine

## 2024-01-03 DIAGNOSIS — I5022 Chronic systolic (congestive) heart failure: Secondary | ICD-10-CM

## 2024-01-08 ENCOUNTER — Telehealth (HOSPITAL_COMMUNITY): Payer: Self-pay | Admitting: Internal Medicine

## 2024-01-08 ENCOUNTER — Other Ambulatory Visit (HOSPITAL_COMMUNITY): Payer: Self-pay

## 2024-01-08 MED ORDER — POTASSIUM CHLORIDE CRYS ER 20 MEQ PO TBCR
20.0000 meq | EXTENDED_RELEASE_TABLET | Freq: Every day | ORAL | 0 refills | Status: DC
  Filled 2024-01-08 – 2024-01-30 (×2): qty 30, 30d supply, fill #0
  Filled 2024-05-07: qty 30, 30d supply, fill #1
  Filled 2024-06-18: qty 30, 30d supply, fill #2

## 2024-01-08 MED ORDER — DAPAGLIFLOZIN PROPANEDIOL 10 MG PO TABS
10.0000 mg | ORAL_TABLET | Freq: Every day | ORAL | 0 refills | Status: AC
  Filled 2024-01-08 – 2024-02-22 (×2): qty 30, 30d supply, fill #0

## 2024-01-08 MED ORDER — ISOSORBIDE MONONITRATE ER 60 MG PO TB24
60.0000 mg | ORAL_TABLET | Freq: Every day | ORAL | 0 refills | Status: DC
  Filled 2024-01-08 – 2024-01-30 (×2): qty 30, 30d supply, fill #0
  Filled 2024-05-07: qty 30, 30d supply, fill #1

## 2024-01-08 MED ORDER — CARVEDILOL 12.5 MG PO TABS
12.5000 mg | ORAL_TABLET | Freq: Two times a day (BID) | ORAL | 0 refills | Status: DC
  Filled 2024-01-08 – 2024-01-30 (×2): qty 60, 30d supply, fill #0

## 2024-01-08 MED ORDER — HYDRALAZINE HCL 100 MG PO TABS
100.0000 mg | ORAL_TABLET | Freq: Three times a day (TID) | ORAL | 0 refills | Status: DC
  Filled 2024-01-08 – 2024-01-30 (×2): qty 90, 30d supply, fill #0

## 2024-01-08 NOTE — Telephone Encounter (Signed)
 Front office called patient to schedule a f/u appt with an echo, per CMA [place] April this patient is past due for a f/u appt and he also needs and echo. Patients voice mail box was full and cannot take any more messages. Unable to reach patient.

## 2024-01-10 ENCOUNTER — Other Ambulatory Visit: Payer: Self-pay

## 2024-01-10 ENCOUNTER — Telehealth (HOSPITAL_COMMUNITY): Payer: Self-pay

## 2024-01-10 ENCOUNTER — Other Ambulatory Visit (HOSPITAL_COMMUNITY): Payer: Self-pay

## 2024-01-10 NOTE — Telephone Encounter (Signed)
 Advanced Heart Failure Patient Advocate Encounter  Received notification that pt will need to renew patient assistance for Farxiga via [employer]. Attempted to contact patient; no answer, voice mail box is full.  Burnell Blanks, CPhT Rx Patient Advocate Phone: [phone]

## 2024-01-22 ENCOUNTER — Other Ambulatory Visit (HOSPITAL_COMMUNITY): Payer: Self-pay

## 2024-01-23 ENCOUNTER — Other Ambulatory Visit (HOSPITAL_COMMUNITY): Payer: Self-pay

## 2024-01-30 ENCOUNTER — Other Ambulatory Visit (HOSPITAL_COMMUNITY): Payer: Self-pay

## 2024-02-22 ENCOUNTER — Other Ambulatory Visit (HOSPITAL_COMMUNITY): Payer: Self-pay

## 2024-02-22 ENCOUNTER — Telehealth (HOSPITAL_COMMUNITY): Payer: Self-pay

## 2024-02-22 ENCOUNTER — Telehealth (HOSPITAL_COMMUNITY): Payer: Self-pay | Admitting: Pharmacy Technician

## 2024-02-22 ENCOUNTER — Other Ambulatory Visit (HOSPITAL_COMMUNITY): Payer: Self-pay | Admitting: Family Medicine

## 2024-02-22 ENCOUNTER — Encounter (HOSPITAL_COMMUNITY): Payer: Self-pay

## 2024-02-22 ENCOUNTER — Other Ambulatory Visit: Payer: Self-pay

## 2024-02-22 ENCOUNTER — Other Ambulatory Visit (HOSPITAL_COMMUNITY): Payer: Self-pay | Admitting: Internal Medicine

## 2024-02-22 NOTE — Telephone Encounter (Signed)
 Advanced Heart Failure Patient Advocate Encounter  Reached out to eh patient via mychart to start Farxiga  assistance renewal application.   Also inquired how patient is receiving Eliquis . Can stat an assistance application for that as well, if he is not currently receiving it.   Will follow up.

## 2024-02-22 NOTE — Telephone Encounter (Signed)
 Advanced Heart Failure Patient Advocate Encounter  Medication Samples have been left at registration desk for patient pick up. Drug name: Eliquis  5 MG Qty: 4x 14 ct packages LOT: [id number] Exp.: 03/2025 SIG: Take 1 tablet by mouth twice daily   The patient has been instructed regarding the correct time, dose, and frequency of taking this medication, including desired effects and most common side effects.   Kennis Peacock, CPhT Rx Patient Advocate Phone: [phone]

## 2024-02-22 NOTE — Telephone Encounter (Signed)
 Advanced Heart Failure Patient Advocate Encounter  Medication Samples have been left at registration desk for patient pick up. Drug name: Farxiga 10 MG Qty: 4x 7 ct packages LOT: [id number] Exp.: 07-16-2026 SIG: Take 1 tablet by mouth once daily   The patient has been instructed regarding the correct time, dose, and frequency of taking this medication, including desired effects and most common side effects.   Kennis Peacock, CPhT Rx Patient Advocate Phone: [phone]

## 2024-02-22 NOTE — Telephone Encounter (Signed)
 Advanced Heart Failure Patient Advocate Encounter  Sent AZ[employer]Me application via docusign.

## 2024-02-23 ENCOUNTER — Other Ambulatory Visit (HOSPITAL_COMMUNITY): Payer: Self-pay

## 2024-02-23 MED ORDER — HYDRALAZINE HCL 100 MG PO TABS
100.0000 mg | ORAL_TABLET | Freq: Three times a day (TID) | ORAL | 0 refills | Status: DC
  Filled 2024-02-23: qty 45, 15d supply, fill #0

## 2024-02-23 MED ORDER — FUROSEMIDE 40 MG PO TABS
80.0000 mg | ORAL_TABLET | Freq: Every day | ORAL | 0 refills | Status: DC
  Filled 2024-02-23: qty 45, 22d supply, fill #0

## 2024-02-23 NOTE — Telephone Encounter (Signed)
 Pt is being seen in the A-Fib clinic

## 2024-02-23 NOTE — Telephone Encounter (Signed)
This is a A-Fib clinic pt 

## 2024-02-29 NOTE — Telephone Encounter (Addendum)
 Advanced Heart Failure Patient Advocate Encounter  Application sent in via efax.   Will follow up.

## 2024-03-01 ENCOUNTER — Telehealth (HOSPITAL_COMMUNITY): Payer: Self-pay | Admitting: Pharmacy Technician

## 2024-03-01 NOTE — Telephone Encounter (Signed)
 Advanced Heart Failure Patient Advocate Encounter  Sent in [employer]MS application for Eliquis  assistance via efax.   Will follow up.

## 2024-03-01 NOTE — Telephone Encounter (Signed)
 Advanced Heart Failure Patient Advocate Encounter   Patient was approved to receive Farxiga  from [employer]  Effective dates: 03/01/24 through 10/16/24  Document scanned to chart. "Allow 7-10 business days for medication to ship."

## 2024-03-05 ENCOUNTER — Other Ambulatory Visit (HOSPITAL_COMMUNITY): Payer: Self-pay

## 2024-03-07 ENCOUNTER — Other Ambulatory Visit (HOSPITAL_COMMUNITY): Payer: Self-pay

## 2024-03-07 NOTE — Telephone Encounter (Signed)
 Advanced Heart Failure Patient Advocate Encounter  Received notification that Farxiga  was shipped.  Correne Dillon, CPhT

## 2024-03-08 NOTE — Telephone Encounter (Signed)
 Advanced Heart Failure Patient Advocate Encounter  [employer] wanted a copy of the patients Medicare card. Sent in via efax.   Will follow up.

## 2024-03-14 NOTE — Telephone Encounter (Signed)
 Advanced Heart Failure Patient Advocate Encounter   Patient was approved to receive Eliquis  from BMS  Effective dates: 03/14/24 through 10/16/24  Document scanned to chart. Updated patient via mychart.  Correne Dillon, CPhT

## 2024-04-16 DIAGNOSIS — G459 Transient cerebral ischemic attack, unspecified: Secondary | ICD-10-CM | POA: Diagnosis not present

## 2024-04-16 DIAGNOSIS — R000 Tachycardia, unspecified: Secondary | ICD-10-CM | POA: Diagnosis not present

## 2024-04-16 DIAGNOSIS — I4891 Unspecified atrial fibrillation: Secondary | ICD-10-CM | POA: Diagnosis not present

## 2024-04-16 DIAGNOSIS — R4781 Slurred speech: Secondary | ICD-10-CM | POA: Diagnosis not present

## 2024-04-16 DIAGNOSIS — I10 Essential (primary) hypertension: Secondary | ICD-10-CM | POA: Diagnosis not present

## 2024-04-16 DIAGNOSIS — R531 Weakness: Secondary | ICD-10-CM | POA: Diagnosis not present

## 2024-04-17 ENCOUNTER — Other Ambulatory Visit (HOSPITAL_COMMUNITY): Payer: Self-pay

## 2024-04-17 ENCOUNTER — Emergency Department (HOSPITAL_COMMUNITY)

## 2024-04-17 ENCOUNTER — Emergency Department (HOSPITAL_COMMUNITY)
Admission: EM | Admit: 2024-04-17 | Discharge: 2024-04-17 | Disposition: A | Discharge status: Home / Self Care | Attending: Emergency Medicine | Admitting: Emergency Medicine

## 2024-04-17 ENCOUNTER — Other Ambulatory Visit: Payer: Self-pay

## 2024-04-17 ENCOUNTER — Encounter (HOSPITAL_COMMUNITY): Payer: Self-pay

## 2024-04-17 ENCOUNTER — Telehealth (HOSPITAL_COMMUNITY): Payer: Self-pay | Admitting: Cardiology

## 2024-04-17 DIAGNOSIS — R0989 Other specified symptoms and signs involving the circulatory and respiratory systems: Secondary | ICD-10-CM | POA: Diagnosis not present

## 2024-04-17 DIAGNOSIS — R531 Weakness: Secondary | ICD-10-CM | POA: Diagnosis not present

## 2024-04-17 DIAGNOSIS — R4701 Aphasia: Secondary | ICD-10-CM | POA: Diagnosis present

## 2024-04-17 DIAGNOSIS — Z8673 Personal history of transient ischemic attack (TIA), and cerebral infarction without residual deficits: Secondary | ICD-10-CM | POA: Diagnosis not present

## 2024-04-17 DIAGNOSIS — G459 Transient cerebral ischemic attack, unspecified: Secondary | ICD-10-CM | POA: Insufficient documentation

## 2024-04-17 DIAGNOSIS — I672 Cerebral atherosclerosis: Secondary | ICD-10-CM | POA: Diagnosis not present

## 2024-04-17 DIAGNOSIS — I509 Heart failure, unspecified: Secondary | ICD-10-CM | POA: Diagnosis not present

## 2024-04-17 DIAGNOSIS — G9389 Other specified disorders of brain: Secondary | ICD-10-CM | POA: Diagnosis not present

## 2024-04-17 DIAGNOSIS — R29818 Other symptoms and signs involving the nervous system: Secondary | ICD-10-CM | POA: Diagnosis not present

## 2024-04-17 DIAGNOSIS — I6523 Occlusion and stenosis of bilateral carotid arteries: Secondary | ICD-10-CM | POA: Diagnosis not present

## 2024-04-17 DIAGNOSIS — Z7901 Long term (current) use of anticoagulants: Secondary | ICD-10-CM | POA: Insufficient documentation

## 2024-04-17 LAB — CBC WITH DIFFERENTIAL/PLATELET
Abs Immature Granulocytes: 0.03 10*3/uL (ref 0.00–0.07)
Basophils Absolute: 0.1 10*3/uL (ref 0.0–0.1)
Basophils Relative: 1 %
Eosinophils Absolute: 0.3 10*3/uL (ref 0.0–0.5)
Eosinophils Relative: 3 %
HCT: 49.5 % (ref 39.0–52.0)
Hemoglobin: 16.6 g/dL (ref 13.0–17.0)
Immature Granulocytes: 0 %
Lymphocytes Relative: 20 %
Lymphs Abs: 1.9 10*3/uL (ref 0.7–4.0)
MCH: 29.6 pg (ref 26.0–34.0)
MCHC: 33.5 g/dL (ref 30.0–36.0)
MCV: 88.4 fL (ref 80.0–100.0)
Monocytes Absolute: 0.7 10*3/uL (ref 0.1–1.0)
Monocytes Relative: 8 %
Neutro Abs: 6.5 10*3/uL (ref 1.7–7.7)
Neutrophils Relative %: 68 %
Platelets: 188 10*3/uL (ref 150–400)
RBC: 5.6 MIL/uL (ref 4.22–5.81)
RDW: 16 % — ABNORMAL HIGH (ref 11.5–15.5)
WBC: 9.5 10*3/uL (ref 4.0–10.5)
nRBC: 0 % (ref 0.0–0.2)

## 2024-04-17 LAB — COMPREHENSIVE METABOLIC PANEL WITH GFR
ALT: 22 U/L (ref 0–44)
AST: 24 U/L (ref 15–41)
Albumin: 3.7 g/dL (ref 3.5–5.0)
Alkaline Phosphatase: 78 U/L (ref 38–126)
Anion gap: 11 (ref 5–15)
BUN: 25 mg/dL — ABNORMAL HIGH (ref 8–23)
CO2: 21 mmol/L — ABNORMAL LOW (ref 22–32)
Calcium: 9.5 mg/dL (ref 8.9–10.3)
Chloride: 108 mmol/L (ref 98–111)
Creatinine, Ser: 1.79 mg/dL — ABNORMAL HIGH (ref 0.61–1.24)
GFR, Estimated: 41 mL/min — ABNORMAL LOW (ref >60)
Glucose, Bld: 101 mg/dL — ABNORMAL HIGH (ref 70–99)
Potassium: 3.5 mmol/L (ref 3.5–5.1)
Sodium: 140 mmol/L (ref 135–145)
Total Bilirubin: 1.4 mg/dL — ABNORMAL HIGH (ref 0.0–1.2)
Total Protein: 7.4 g/dL (ref 6.5–8.1)

## 2024-04-17 LAB — TROPONIN I (HIGH SENSITIVITY): Troponin I (High Sensitivity): 46 ng/L — ABNORMAL HIGH (ref <18)

## 2024-04-17 LAB — LIPASE, BLOOD: Lipase: 35 U/L (ref 11–51)

## 2024-04-17 LAB — BRAIN NATRIURETIC PEPTIDE: B Natriuretic Peptide: 759.8 pg/mL — ABNORMAL HIGH (ref 0.0–100.0)

## 2024-04-17 MED ORDER — IOHEXOL 350 MG/ML SOLN
75.0000 mL | Freq: Once | INTRAVENOUS | Status: AC | PRN
  Administered 2024-04-17: 75 mL via INTRAVENOUS

## 2024-04-17 MED ORDER — HYDRALAZINE HCL 50 MG PO TABS
100.0000 mg | ORAL_TABLET | Freq: Three times a day (TID) | ORAL | Status: DC
  Administered 2024-04-17: 100 mg via ORAL
  Filled 2024-04-17: qty 4

## 2024-04-17 MED ORDER — HYDRALAZINE HCL 25 MG PO TABS: 100.0000 mg | ORAL_TABLET | Freq: Four times a day (QID) | ORAL | Status: DC

## 2024-04-17 MED ORDER — APIXABAN 5 MG PO TABS
5.0000 mg | ORAL_TABLET | Freq: Two times a day (BID) | ORAL | Status: DC
  Administered 2024-04-17 (×2): 5 mg via ORAL
  Filled 2024-04-17 (×2): qty 1

## 2024-04-17 MED ORDER — FUROSEMIDE 40 MG PO TABS
80.0000 mg | ORAL_TABLET | Freq: Every day | ORAL | 0 refills | Status: DC
  Filled 2024-04-17: qty 30, 15d supply, fill #0

## 2024-04-17 MED ORDER — APIXABAN 5 MG PO TABS
5.0000 mg | ORAL_TABLET | Freq: Two times a day (BID) | ORAL | 0 refills | Status: DC
  Filled 2024-04-17: qty 60, 30d supply, fill #0

## 2024-04-17 NOTE — ED Triage Notes (Signed)
 PT BIB [facility] from home with a sudden onset of right sided weakness. PT couldn't raise arm,facial droop and couldn't raise arm or speak. Sister is a Engineer, civil (consulting) and was with him and did a stroke screen.PT's symptoms resolved within 5 min but was hypertensive at 210/130.PT does have a hx of a-fib running 80-110 and is on thinners but has been out of his thinners for months.PT also has a hx of CHF and has been struggling to get a refill on his lasix .PT is A+O x 4.

## 2024-04-17 NOTE — ED Provider Notes (Signed)
 Emergency Medicine Observation Re-evaluation Note  Timothy Moon is a 68 y.o. male, seen on rounds today.  Pt initially presented to the ED for complaints of No chief complaint on file. Currently, the patient is concerned for TIA.SABRA  Physical Exam  BP (!) 151/125   Pulse (!) 57   Resp 11   Ht 6' 3 (1.905 m)   Wt 123.4 kg   SpO2 99%   BMI 34.00 kg/m  Physical Exam General: NAD Cardiac: RRR Lungs: CTAB   ED Course / MDM  EKG:   I have reviewed the labs performed to date as well as medications administered while in observation.  Recent changes in the last 24 hours include None.  Plan  Current plan is for discharge.  I tried to obtain a second phone given that I did not see a second troponin draw from last night.  His troponin was in the 40s.  He has a history of CHF cells likely from his CHF given that his protein levels usually in the 100s.  He did not want to stay for repeat.  Explained to him the risk but he states he does not want to stay for this and rather just go home.  He understands the risk of this including missing cardiac pathology.  Did give him Eliquis  as well as Lasix .  He will follow-up with cardiology outpatient.SABRA Simon Lavonia LOISE, MD 04/17/24 [phone]

## 2024-04-17 NOTE — ED Notes (Signed)
 Please update Amy, # under emergency contacts

## 2024-04-17 NOTE — Discharge Instructions (Addendum)
 Please call  Eliquis  Assistance at [phone] to arrange delivery of monthly Eliquis .  We wanted to obtain one more troponin but you did not stay for that. Please know that this is a cardiac enzyme that we like to trend. If you start having any worsening chest pain then please come back to the ED.

## 2024-04-17 NOTE — Telephone Encounter (Signed)
 [facility] ER RN and Vertell with HF Pharm team Pt needs samples until PA shipment is receieved  Medication Samples have been provided to the patient.  Drug name: eliquis        Strength: 5 mg        Qty: 56  LOT: [id number]  Exp.Date: 04/2025  Dosing instructions: one tab twice a day  The patient has been instructed regarding the correct time, dose, and frequency of taking this medication, including desired effects and most common side effects.   Jerona Pencil M 11:41 AM 04/17/2024

## 2024-04-17 NOTE — Progress Notes (Signed)
 Transition of Care [facility]) - Emergency Department Mini Assessment   Patient Details  Name: Timothy Moon MRN: [id number] Date of Birth: 03-May-1956  Transition of Care [facility]) CM/SW Contact:    Debarah Saunas, RN Phone Number: 04/17/2024, 9:47 AM   Clinical Narrative: RNCM consulted regarding pt not being able to obtain Eliquis  through community pharmacy.  RNCM contacted [facility] TOC pharmacy to find that Pt was enrolled in Medication assistance through [facility] Heart Failure team and that Rx had been filled through [facility] and is being shipped to pt home.  Heart Failure Eliquis  was reach out to pt for the first shipment, if he missed their call please call them back at, [phone]. RNCM had pt to reach out to number regarding Rx shipment and payment.  ED Mini Assessment: What brought you to the Emergency Department? : (P) I was experieincing some weakness on my right side and my speech was slurred  Barriers to Discharge: (P) ED Medication assistance  Barrier interventions: (P) TOC Pharmacy consult  Means of departure: (P) Car  Interventions which prevented an admission or readmission: Medication Review    Patient Contact and Communications Key Contact 1: (P) Amy      ,     Contact Phone Number: (P) [phone]    Patient states their goals for this hospitalization and ongoing recovery are:: (P) to get my medications and go back home      Admission diagnosis:  TIA Patient Active Problem List   Diagnosis Date Noted   Chronic systolic heart failure (HCC) 09/01/2022   Atrial fibrillation (HCC) 12/09/2021   Ascending aortic aneurysm (HCC) 11/23/2021   Acute congestive heart failure (HCC) 10/19/2021   Hypertensive urgency 10/19/2021   Hypokalemia 10/19/2021   Chronic kidney disease, stage 3b (HCC) 10/19/2021   Essential hypertension 10/19/2021   Cocaine abuse (HCC) 10/19/2021   Nicotine dependence, cigarettes, uncomplicated 10/19/2021   PCP:   Lucius Krabbe, NP Pharmacy:   [facility] [address] [place] [address] Phone: [phone] Fax: [phone]

## 2024-04-17 NOTE — ED Provider Notes (Signed)
 [place] EMERGENCY DEPARTMENT AT [facility] Provider Note   CSN: [id number] Arrival date & time: 04/17/24  0008     History No chief complaint on file.   HPI Timothy Moon is a 68 y.o. male presenting for chief complaint of right sided weakness and aphasia. Episode lasted <10 minutes. States that he has AfIb but lost his coverage for eliquis  a few months ago  Patient's recorded medical, surgical, social, medication list and allergies were reviewed in the Snapshot window as part of the initial history.   Review of Systems   Review of Systems  Constitutional:  Negative for chills and fever.  HENT:  Negative for ear pain and sore throat.   Eyes:  Negative for pain and visual disturbance.  Respiratory:  Negative for cough and shortness of breath.   Cardiovascular:  Negative for chest pain and palpitations.  Gastrointestinal:  Negative for abdominal pain and vomiting.  Genitourinary:  Negative for dysuria and hematuria.  Musculoskeletal:  Negative for arthralgias and back pain.  Skin:  Negative for color change and rash.  Neurological:  Positive for speech difficulty and weakness. Negative for seizures and syncope.  All other systems reviewed and are negative.   Physical Exam Updated Vital Signs BP (!) 185/123   Pulse (!) 109   Resp 19   Ht 6' 3 (1.905 m)   Wt 123.4 kg   SpO2 99%   BMI 34.00 kg/m  Physical Exam Vitals and nursing note reviewed.  Constitutional:      General: He is not in acute distress.    Appearance: He is well-developed.  HENT:     Head: Normocephalic and atraumatic.  Eyes:     Conjunctiva/sclera: Conjunctivae normal.  Cardiovascular:     Rate and Rhythm: Normal rate and regular rhythm.     Heart sounds: No murmur heard. Pulmonary:     Effort: Pulmonary effort is normal. No respiratory distress.     Breath sounds: Normal breath sounds.  Abdominal:     Palpations: Abdomen is soft.     Tenderness: There is no abdominal  tenderness.  Musculoskeletal:        General: No swelling.     Cervical back: Neck supple.  Skin:    General: Skin is warm and dry.     Capillary Refill: Capillary refill takes less than 2 seconds.  Neurological:     Mental Status: He is alert.  Psychiatric:        Mood and Affect: Mood normal.      ED Course/ Medical Decision Making/ A&P Clinical Course as of 04/17/24 0622  Wed Apr 17, 2024  0238 Neurology and medicine both feel this patient can likely be managed in the outpatient setting as he is already on maximal medical therapy. Will consult social work for assisting patient in obtaining his medicines in the morning anticipate discharge after access to medications been reobtained. [CC]  0429 RBC: 5.60 [CC]    Clinical Course User Index [CC] Jerral Meth, MD    Procedures Procedures   Medications Ordered in ED Medications  apixaban  (ELIQUIS ) tablet 5 mg (5 mg Oral Given 04/17/24 0308)  hydrALAZINE  (APRESOLINE ) tablet 100 mg (100 mg Oral Given 04/17/24 0550)  iohexol (OMNIPAQUE) 350 MG/ML injection 75 mL (75 mLs Intravenous Contrast Given 04/17/24 0408)    Medical Decision Making:   Timothy Moon is a 68 y.o. male who presented to the ED today with right-sided weakness, aphasia detailed above.    Patient  placed on continuous vitals and telemetry monitoring while in ED which was reviewed periodically.  Complete initial physical exam performed, notably the patient  was hemodynamically stable no acute distress.    Reviewed and confirmed nursing documentation for past medical history, family history, social history.    Initial Assessment:   With the patient's presentation of weakness and aphasia, most likely diagnosis is stroke versus TIA. Other diagnoses were considered including (but not limited to) vascular dissection, intracranial infection, medication side effect, polypharmacy. These are considered less likely due to history of present illness and physical exam  findings.   This is most consistent with an acute life/limb threatening illness complicated by underlying chronic conditions.  Initial Plan:  CT head followed by MRI brain to evaluate for acute stroke Screening labs including CBC and Metabolic panel to evaluate for infectious or metabolic etiology of disease.  Objective evaluation as below reviewed   Initial Study Results:   Laboratory  All laboratory results reviewed without evidence of clinically relevant pathology.    EKG EKG was reviewed independently. Rate, rhythm, axis, intervals all examined and without medically relevant abnormality. ST segments without concerns for elevations.    Radiology:  All images reviewed independently. Agree with radiology report at this time.   CT Angio Neck W and/or Wo Contrast Result Date: 04/17/2024 CLINICAL DATA:  68 year old male neurologic deficit, stroke presentation. Sudden onset right side weakness. EXAM: CT ANGIOGRAPHY NECK TECHNIQUE: Multidetector CT imaging of the neck was performed using the standard protocol during bolus administration of intravenous contrast. Multiplanar CT image reconstructions and MIPs were obtained to evaluate the vascular anatomy. Carotid stenosis measurements (when applicable) are obtained utilizing NASCET criteria, using the distal internal carotid diameter as the denominator. RADIATION DOSE REDUCTION: This exam was performed according to the departmental dose-optimization program which includes automated exposure control, adjustment of the mA and/or kV according to patient size and/or use of iterative reconstruction technique. CONTRAST:  75mL OMNIPAQUE IOHEXOL 350 MG/ML SOLN COMPARISON:  Brain MRI 0120 hours today.  Chest CT 05/13/2022. FINDINGS: CTA NECK Skeleton: Carious posterior dentition. Ordinary cervical spine degeneration. No acute osseous abnormality identified. Upper chest: Centrilobular emphysema in the upper lungs is more apparent in 2023. Lower lung volumes with  mild apical atelectasis now. Visible mediastinal lymph nodes are stable, within normal limits. Other neck: Chronic 14 mm left thyroid  nodule is stable (no follow-up imaging recommended). Other nonvascular neck soft tissue spaces are within normal limits. Aortic arch: Calcified aortic atherosclerosis.  Three vessel arch. Right carotid system: Mild brachiocephalic artery plaque without stenosis. Patent right CCA and right carotid bifurcation. Mild proximal right ICA calcified plaque without stenosis. Mild right ICA tortuosity in the neck. Left carotid system: Mildly tortuous proximal left CCA with no plaque or stenosis. Soft plaque in the posterior distal left CCA on series 7, image 69 without stenosis. Patent left carotid bifurcation. Mild calcified plaque of the proximal left ICA. No stenosis to the skull base. Vertebral arteries: Right subclavian origin calcified plaque without stenosis. Normal right vertebral artery origin. Patent right vertebral to the skull base with no significant plaque or stenosis. Proximal left subclavian artery soft and calcified plaque without stenosis. Calcified plaque near the left vertebral artery origin but no origin stenosis. Tortuous and mildly dominant left vertebral artery in the lower neck, patent to the skull base with no significant plaque or stenosis. Limited CTA HEAD Posterior circulation: Patent distal vertebral arteries and vertebrobasilar junction, PICA origins without stenosis. Visible basilar artery is patent without stenosis.  Anterior circulation: Visible ICA siphons are patent with mild to moderate calcified plaque bilaterally, no visible ICA siphon stenosis. Anatomic variants: Mildly dominant left vertebral artery. Review of the MIP images confirms the above findings IMPRESSION: 1. Negative for large vessel occlusion in the neck or at the skull base. 2. Mild for age atherosclerosis. No arterial stenosis in the neck. 3. Aortic Atherosclerosis (ICD10-I70.0) and Emphysema  (ICD10-J43.9). Electronically Signed   By: VEAR Hurst M.D.   On: 04/17/2024 04:26   MR BRAIN WO CONTRAST Result Date: 04/17/2024 CLINICAL DATA:  Neuro deficit, acute, stroke suspected EXAM: MRI HEAD WITHOUT CONTRAST TECHNIQUE: Multiplanar, multiecho pulse sequences of the brain and surrounding structures were obtained without intravenous contrast. COMPARISON:  Same day CT head. FINDINGS: Brain: No acute infarction, hemorrhage, hydrocephalus, extra-axial collection or mass lesion. Remote right parietal infarct. Vascular: Major arterial flow voids are maintained at the skull base. Skull and upper cervical spine: Normal marrow signal. Sinuses/Orbits: Clear sinuses.  No acute orbital findings. IMPRESSION: 1. No evidence of acute intracranial abnormality. 2. Remote right parietal infarct. Electronically Signed   By: Gilmore GORMAN Molt M.D.   On: 04/17/2024 01:58   CT HEAD WO CONTRAST ( ) Result Date: 04/17/2024 CLINICAL DATA:  Sudden onset of right-sided weakness. EXAM: CT HEAD WITHOUT CONTRAST TECHNIQUE: Contiguous axial images were obtained from the base of the skull through the vertex without intravenous contrast. RADIATION DOSE REDUCTION: This exam was performed according to the departmental dose-optimization program which includes automated exposure control, adjustment of the mA and/or kV according to patient size and/or use of iterative reconstruction technique. COMPARISON:  None Available. FINDINGS: Brain: There is generalized cerebral atrophy with widening of the extra-axial spaces and ventricular dilatation. There are areas of decreased attenuation within the white matter tracts of the supratentorial brain, consistent with microvascular disease changes. Vascular: Mild bilateral cavernous carotid artery calcification is noted. Skull: Normal. Negative for fracture or focal lesion. Sinuses/Orbits: There is mild right maxillary sinus mucosal thickening. Other: None. IMPRESSION: 1. Generalized cerebral atrophy and  microvascular disease changes of the supratentorial brain. 2. No acute intracranial abnormality. 3. Mild right maxillary sinus disease. Electronically Signed   By: Suzen Dials M.D.   On: 04/17/2024 01:11      Consults: Case discussed with neurology.   Reassessment and Plan:   Neurology has requested CTA be added on. This was reviewed with no focal pathology as well. Effectively, patient is already on maximum medical therapy with Eliquis .  He does not know how to get his Eliquis  as he already [facility] with [facility] and states that it was declined for last refill. Will consult social work during business hour, given initial treatment as well as his home blood pressure medicine.  Refilled both Eliquis  and Lasix  to the [facility] pharmacy.  Patient stable for discharge once social work has confirmed that he is able to get his medications.   Disposition:  I have considered need for hospitalization, however, considering all of the above, I believe this patient is stable for discharge at this time.  Patient/family educated about specific return precautions for given chief complaint and symptoms.  Patient/family educated about follow-up with PCP.     Patient/family expressed understanding of return precautions and need for follow-up. Patient spoken to regarding all imaging and laboratory results and appropriate follow up for these results. All education provided in verbal form with additional information in written form. Time was allowed for answering of patient questions. Patient  discharged.    Emergency Department Medication Summary:   Medications  apixaban  (ELIQUIS ) tablet 5 mg (5 mg Oral Given 04/17/24 0308)  hydrALAZINE  (APRESOLINE ) tablet 100 mg (100 mg Oral Given 04/17/24 0550)  iohexol (OMNIPAQUE) 350 MG/ML injection 75 mL (75 mLs Intravenous Contrast Given 04/17/24 0408)         Clinical Impression:  1. TIA (transient ischemic attack)       Data Unavailable   Final Clinical Impression(s) / ED Diagnoses Final diagnoses:  TIA (transient ischemic attack)    Rx / DC Orders ED Discharge Orders          Ordered    apixaban  (ELIQUIS ) 5 MG TABS tablet  2 times daily        04/17/24 0349    furosemide  (LASIX ) 40 MG tablet  Daily        04/17/24 0349              Jerral Meth, MD 04/17/24 [phone]

## 2024-04-17 NOTE — TOC Progression Note (Signed)
 Transition of Care [facility]) - Progression Note    Patient Details  Name: Timothy Moon MRN: [id number] Date of Birth: January 06, 1956  Transition of Care [facility]) CM/SW Contact  Debarah Saunas, RN Phone Number: 04/17/2024, 12:07 PM  Clinical Narrative:    RNCM met with pt at bedside to discuss medications and how to retreive them  RNCM picked up Rx being held for pt at [facility] and delivered to pt at bedside.  RNCM advised pt to: 1) pick up Lasix  that is ready for him at [facility] on [address]  2) Call  Eliquis  Assistance Program at [phone] to arrange delivery pf monthly medications  Pt repeated instructions to [facility] to verbalize understanding of instructions.        Barriers to Discharge: ED Medication assistance  Expected Discharge Plan and Services    Home with self care                                           Social Determinants of Health (SDOH) Interventions SDOH Screenings   Food Insecurity: No Food Insecurity (07/25/2022)  Housing: Low Risk  (07/25/2022)  Transportation Needs: No Transportation Needs (07/25/2022)  Utilities: Not At Risk (07/25/2022)  Alcohol Screen: Low Risk  (10/21/2021)  Depression (PHQ2-9): Low Risk  (07/25/2022)  Financial Resource Strain: Low Risk  (07/25/2022)  Physical Activity: Inactive (07/25/2022)  Social Connections: Socially Isolated (07/25/2022)  Stress: No Stress Concern Present (07/25/2022)  Tobacco Use: Medium Risk (04/17/2024)    Readmission Risk Interventions     No data to display

## 2024-04-18 ENCOUNTER — Telehealth (HOSPITAL_COMMUNITY): Payer: Self-pay | Admitting: Pharmacy Technician

## 2024-04-18 NOTE — Telephone Encounter (Signed)
 [facility] Patient Advocate Encounter  Was contacted yesterday by ED nurse in regards to patients Eliquis . He has not received it from [facility] as of yet. Helped coordinate samples with office and ensured patient had phone number to [facility] [phone].  Almarie JULIANNA Pa, CPhT

## 2024-04-24 ENCOUNTER — Encounter (HOSPITAL_COMMUNITY): Payer: Self-pay | Admitting: Physician Assistant

## 2024-04-24 ENCOUNTER — Other Ambulatory Visit: Payer: Self-pay | Admitting: Thoracic Surgery (Cardiothoracic Vascular Surgery)

## 2024-04-24 ENCOUNTER — Other Ambulatory Visit (HOSPITAL_COMMUNITY): Payer: Self-pay

## 2024-04-24 ENCOUNTER — Ambulatory Visit (HOSPITAL_COMMUNITY)
Admission: RE | Admit: 2024-04-24 | Discharge: 2024-04-24 | Disposition: A | Discharge status: Home / Self Care | Source: Ambulatory Visit | Attending: Physician Assistant | Admitting: Physician Assistant

## 2024-04-24 VITALS — BP 138/100 | HR 94 | Ht 75.0 in | Wt 249.6 lb

## 2024-04-24 DIAGNOSIS — D6869 Other thrombophilia: Secondary | ICD-10-CM | POA: Insufficient documentation

## 2024-04-24 DIAGNOSIS — I4819 Other persistent atrial fibrillation: Secondary | ICD-10-CM | POA: Insufficient documentation

## 2024-04-24 DIAGNOSIS — I4811 Longstanding persistent atrial fibrillation: Secondary | ICD-10-CM | POA: Diagnosis not present

## 2024-04-24 DIAGNOSIS — I712 Thoracic aortic aneurysm, without rupture, unspecified: Secondary | ICD-10-CM

## 2024-04-24 MED ORDER — APIXABAN 5 MG PO TABS
5.0000 mg | ORAL_TABLET | Freq: Two times a day (BID) | ORAL | 3 refills | Status: AC
  Filled 2024-04-24 – 2024-09-17 (×2): qty 60, 30d supply, fill #0

## 2024-04-24 MED ORDER — CARVEDILOL 12.5 MG PO TABS
12.5000 mg | ORAL_TABLET | Freq: Two times a day (BID) | ORAL | 1 refills | Status: DC
  Filled 2024-04-24 – 2024-05-07 (×2): qty 60, 30d supply, fill #0
  Filled 2024-06-18: qty 60, 30d supply, fill #1
  Filled 2024-07-14: qty 60, 30d supply, fill #2

## 2024-04-24 MED ORDER — AMIODARONE HCL 200 MG PO TABS
ORAL_TABLET | ORAL | 1 refills | Status: DC
  Filled 2024-04-24 – 2024-05-07 (×2): qty 90, 34d supply, fill #0

## 2024-04-24 NOTE — Patient Instructions (Signed)
 05/08/24 start Amiodarone   Take 2 tablets (400 mg total) TWICE a day for 2 weeks, then take 1 tablet (200 mg total) TWICE a day for 2 weeks, then take 1 tablet (200 mg total) ONCE a day

## 2024-04-24 NOTE — Progress Notes (Signed)
 Primary Care Physician: Lucius Krabbe, NP Referring Physician: Harlene Gainer, FNP Primary EP: Dr Inocencio [facility]: Dr Cherrie Nottingham Timothy Moon is a 68 y.o. male with a h/o of poorly controlled HTN, ETOH use, tobacco use and cocaine use, and recently diagnosed with HFrEF.   Admitted to [facility] 1/23 with Acute HFrEF.  Echo showed moderately reduced LVEF 30 to 35%, mod LVH w/ GIIDD, normal RV. Chest CT also showed thoracic aortic aneurysm. Aortic root measures 4.7 to 4.9 cm. Coronary atherosclerosis also noted. UDS negative. Diuresed w/ IV Lasix . Did not undergo LHC given renal insufficiency, which also limited medical therapy. SCr 2.2 (baseline unknown). No ARNi/ARB/spiro/dig. Placed on Imdur /hydarl, coreg  and farxiga . D/c wt 249 lb.   He was seen in the [facility] 1/23 and referred to the [facility]. Hypertensive at the visit. Coreg  and hydralazine  increased. Also started on farxiga . - Echo 10/19/21 EF 30-35%. Global HK, moderate ventricular hypertrophy, grade II DD, RV normal Aneurysm of the ascending aorta, measuring 43 mm.   He was seen by Harlene Gainer, FNP, 11/24/21 and was started on eliquis  5 mg bid and referred to afib clinic to discuss cardioversion. However, he states today that he has only been taking  eliquis  1x daily since 2/9 . His HR's at home in afib  are in the 60-80's.   He is here with his friend today. He states no recent cocaine. Denies alcohol use. He does not appear to be symptomatic with the afib. States compliance with BB. Recently increased.   F/u in afib clinic, 08/11/22. I have not seen since February at which time he was suppose to see me back in 2 weeks to have a cardioversion. He missed Dr. Nelle visit around that time as well. He recently aw Harlene Gainer, NP, 10/24 and was still in afib. He is here now to see about obtaining cardioversion. In the meantime, pt states that he has been feeling all right. He has a rx for eliquis  thru the mail  coming to him, but he was given samples today to start back tonight. After 21 days on anticoagulation, we discussed going on to have a cardioversion. He is in agreement.   F/u in afib clinic, pt states that he has been back on eliquis  5 mg bid since 10/27. No missed doses since then. He remains in afib at 111 bpm. Fluid status stable. He wants to proceed with cardioversion.   F/u  afib clinic, 09/22/22 after successful cardioversion from 09/07/22 but unfortunately ERAF. I will refer to EP to be considered for ablation. EKG shows Afib at 96 bpm.   Follow up 04/24/24. Patient returns for follow up for atrial fibrillation. He was seen by Dr Inocencio 11/03/22 and amiodarone  was recommended. Patient was concerned about the possible side effects and never started the medication. He was lost to follow up. He presented to the ED 04/17/24 with right sided weakness and aphasia. He had not been taking Eliquis  due to loss of coverage. Head CT and MRI did not show acute CVA. He remains in afib today. He has been taking Eliquis  consistently since his ED visit. No bleeding issues.    Today, he  denies symptoms of palpitations, chest pain, shortness of breath, orthopnea, PND, lower extremity edema, dizziness, presyncope, syncope, snoring, daytime somnolence, bleeding, or neurologic sequela. The patient is tolerating medications without difficulties and is otherwise without complaint today.    Past Medical History:  Diagnosis Date   Acute congestive heart failure (  HCC) 10/19/2021   Ascending aortic aneurysm (HCC) 11/23/2021   4.9 cm in January 2023   Chronic kidney disease, stage 3a (HCC) 10/19/2021   Cocaine abuse (HCC) 10/19/2021   Elevated troponin level not due myocardial infarction 10/19/2021   Essential hypertension 10/19/2021   Nicotine dependence, cigarettes, uncomplicated 10/19/2021     Current Outpatient Medications  Medication Sig Dispense Refill   apixaban  (ELIQUIS ) 5 MG TABS tablet Take 1 tablet (5 mg total) by  mouth 2 (two) times daily. 60 tablet 0   aspirin EC 81 MG tablet Take 81-162 mg by mouth every 4 (four) hours as needed for moderate pain (pain score 4-6). Swallow whole.     carvedilol  (COREG ) 12.5 MG tablet Take 1 tablet (12.5 mg total) by mouth 2 (two) times daily with a meal. NEEDS FOLLOW UP APPOINTMENT FOR MORE REFILLS 180 tablet 0   dapagliflozin  propanediol (FARXIGA ) 10 MG TABS tablet Take 1 tablet (10 mg total) by mouth daily. 30 tablet 0   furosemide  (LASIX ) 40 MG tablet Take 2 tablets (80 mg total) by mouth daily. 30 tablet 0   hydrALAZINE  (APRESOLINE ) 100 MG tablet Take 1 tablet (100 mg total) by mouth 3 (three) times daily. Pt must schedule follow up visit with cardiology for further refills. 45 tablet 0   isosorbide  mononitrate (IMDUR ) 60 MG 24 hr tablet Take 1 tablet (60 mg total) by mouth daily. NEEDS FOLLOW UP APPOINTMENT FOR MORE REFILLS 90 tablet 0   Multiple Vitamin (MULTIVITAMIN) capsule Take 1 capsule by mouth as needed. Centrum Silver for men 90 capsule 0   nitroGLYCERIN  (NITROSTAT ) 0.4 MG SL tablet Place 1 tablet (0.4 mg total) under the tongue every 5 (five) minutes as needed for chest pain. 30 tablet 12   potassium chloride  SA (KLOR-CON  M) 20 MEQ tablet Take 1 tablet (20 mEq total) by mouth daily. NEEDS FOLLOW UP APPOINTMENT FOR MORE REFILLS 90 tablet 0   amiodarone  (PACERONE ) 200 MG tablet Take 2 tablets (400 mg total) TWICE a day for 2 weeks, then take 1 tablet (200 mg total) TWICE a day for 2 weeks, then take 1 tablet (200 mg total) ONCE a day (Patient not taking: Reported on 04/17/2024) 90 tablet 1   No current facility-administered medications for this encounter.    ROS- All systems are reviewed and negative except as per the HPI above  Physical Exam: Vitals:   04/24/24 0827  BP: (!) 138/100  Pulse: 94  Weight: 113.2 kg  Height: 6' 3 (1.905 m)    Wt Readings from Last 3 Encounters:  04/24/24 113.2 kg  04/17/24 123.4 kg  11/03/22 123.4 kg     GEN: Well  nourished, well developed in no acute distress NECK: No JVD CARDIAC: Irregularly irregular rate and rhythm, no murmurs, rubs, gallops RESPIRATORY:  Clear to auscultation without rales, wheezing or rhonchi  ABDOMEN: Soft, non-tender, non-distended EXTREMITIES:  No edema; No deformity    ECG today demonstrates Afib Vent. rate 94 BPM PR interval * ms QRS duration 106 ms QT/QTcB 360/450 ms   CHA2DS2-VASc Score = 6  The patient's score is based upon: CHF History: 1 HTN History: 1 Diabetes History: 0 Stroke History: 2 (TIA) Vascular Disease History: 1 Age Score: 1 Gender Score: 0       ASSESSMENT AND PLAN: Longstanding Persistent Atrial Fibrillation (ICD10:  I48.11) The patient's CHA2DS2-VASc score is 6, indicating a 9.7% annual risk of stroke.   Patient remains in rate controlled afib, now longstanding. We discussed rhythm control  options. After discussing the risks and benefits of amiodarone  he is agreeable to starting once he has been on anticoagulation for at least 3 weeks (7/23). Will plan on repeat DCCV once he has finished loading. If he is able to maintain SR, he may be a candidate for ablation.  Start amiodarone  400 mg BID x 2 weeks then 200 mg BID x 2 weeks then 200 mg daily.  Continue carvedilol  12.5 mg BID Continue Eliquis  5 mg BID. Patient has samples and has been approved for patient assistance.   Secondary Hypercoagulable State (ICD10:  D68.69) The patient is at significant risk for stroke/thromboembolism based upon his CHA2DS2-VASc Score of 6.  Continue Apixaban  (Eliquis ). No bleeding issues.   Chronic HFrEF EF 30-35% Fluid status appears stable today He is overdue for follow up with [facility], will request visit.   Thoracic aortic aneurysm 5 cm, overdue for repeat imaging and follow up with Dr Kerrin, will request visit.   HTN Long history of poor control Diastolic elevated today Will defer further changes to primary cardiology team.    Follow up in  the AF clinic in one month.     Daril Kicks PA-C [facility] [address] [place], [place] [address] [phone]

## 2024-04-26 ENCOUNTER — Other Ambulatory Visit (HOSPITAL_COMMUNITY): Payer: Self-pay | Admitting: Cardiology

## 2024-04-26 ENCOUNTER — Other Ambulatory Visit (HOSPITAL_COMMUNITY): Payer: Self-pay

## 2024-04-29 ENCOUNTER — Other Ambulatory Visit (HOSPITAL_COMMUNITY): Payer: Self-pay

## 2024-04-29 MED ORDER — HYDRALAZINE HCL 100 MG PO TABS
100.0000 mg | ORAL_TABLET | Freq: Three times a day (TID) | ORAL | 0 refills | Status: DC
  Filled 2024-05-07: qty 45, 15d supply, fill #0

## 2024-05-03 ENCOUNTER — Telehealth (HOSPITAL_COMMUNITY): Payer: Self-pay | Admitting: Pharmacy Technician

## 2024-05-03 NOTE — Telephone Encounter (Addendum)
 [facility] Patient Advocate Encounter  Received notification from [employer] that Farxiga  has been shipped. Allow 7-10 business days for shipment to arrive.  Almarie JULIANNA Pa, CPhT

## 2024-05-07 ENCOUNTER — Other Ambulatory Visit: Payer: Self-pay | Admitting: Family

## 2024-05-07 ENCOUNTER — Other Ambulatory Visit: Payer: Self-pay

## 2024-05-07 ENCOUNTER — Other Ambulatory Visit (HOSPITAL_COMMUNITY): Payer: Self-pay

## 2024-05-08 ENCOUNTER — Other Ambulatory Visit (HOSPITAL_COMMUNITY): Payer: Self-pay

## 2024-05-10 ENCOUNTER — Other Ambulatory Visit (HOSPITAL_COMMUNITY): Payer: Self-pay | Admitting: Physician Assistant

## 2024-05-10 ENCOUNTER — Other Ambulatory Visit (HOSPITAL_COMMUNITY): Payer: Self-pay

## 2024-05-10 MED ORDER — FUROSEMIDE 40 MG PO TABS
80.0000 mg | ORAL_TABLET | Freq: Every day | ORAL | 0 refills | Status: DC
  Filled 2024-05-10: qty 60, 30d supply, fill #0

## 2024-05-21 ENCOUNTER — Ambulatory Visit: Admitting: Thoracic Surgery (Cardiothoracic Vascular Surgery)

## 2024-05-24 ENCOUNTER — Encounter (HOSPITAL_COMMUNITY): Payer: Self-pay | Admitting: Physician Assistant

## 2024-05-24 ENCOUNTER — Ambulatory Visit (HOSPITAL_COMMUNITY)
Admission: RE | Admit: 2024-05-24 | Discharge: 2024-05-24 | Disposition: A | Discharge status: Home / Self Care | Source: Ambulatory Visit | Attending: Physician Assistant | Admitting: Physician Assistant

## 2024-05-24 ENCOUNTER — Other Ambulatory Visit (HOSPITAL_COMMUNITY): Payer: Self-pay

## 2024-05-24 VITALS — BP 150/100 | HR 73 | Ht 75.0 in | Wt 253.2 lb

## 2024-05-24 DIAGNOSIS — Z79899 Other long term (current) drug therapy: Secondary | ICD-10-CM | POA: Insufficient documentation

## 2024-05-24 DIAGNOSIS — I4811 Longstanding persistent atrial fibrillation: Secondary | ICD-10-CM | POA: Diagnosis not present

## 2024-05-24 DIAGNOSIS — I4819 Other persistent atrial fibrillation: Secondary | ICD-10-CM | POA: Insufficient documentation

## 2024-05-24 DIAGNOSIS — Z5181 Encounter for therapeutic drug level monitoring: Secondary | ICD-10-CM | POA: Diagnosis not present

## 2024-05-24 DIAGNOSIS — D6869 Other thrombophilia: Secondary | ICD-10-CM | POA: Diagnosis not present

## 2024-05-24 MED ORDER — FUROSEMIDE 40 MG PO TABS
80.0000 mg | ORAL_TABLET | Freq: Every day | ORAL | 0 refills | Status: DC
  Filled 2024-05-24 – 2024-06-15 (×2): qty 120, 60d supply, fill #0

## 2024-05-24 MED ORDER — HYDRALAZINE HCL 100 MG PO TABS
100.0000 mg | ORAL_TABLET | Freq: Three times a day (TID) | ORAL | 0 refills | Status: DC
  Filled 2024-05-24: qty 45, 15d supply, fill #0

## 2024-05-24 MED ORDER — ISOSORBIDE MONONITRATE ER 60 MG PO TB24
60.0000 mg | ORAL_TABLET | Freq: Every day | ORAL | 0 refills | Status: DC
  Filled 2024-05-24: qty 90, 90d supply, fill #0
  Filled 2024-06-18: qty 30, 30d supply, fill #0
  Filled 2024-07-14: qty 30, 30d supply, fill #1
  Filled 2024-08-15: qty 30, 30d supply, fill #2

## 2024-05-24 NOTE — Patient Instructions (Addendum)
 HOLD Dapagliflozin  (Farxiga ) AFTER 06/15/24   Cardioversion scheduled for: 06/19/24 Wednesday 10:00 am    - Arrive at the [facility] ([address]  and check in with ADMITTING at 10:00 am    - Do not eat or drink anything after midnight the night prior to your procedure.   - Take all your morning medication (except diabetic medications) with a sip of water prior to arrival.  - Do NOT miss any doses of your blood thinner - if you should miss a dose or take a dose more than 4 hours late -- please notify our office immediately.  - You will not be able to drive home after your procedure. Please ensure you have a responsible adult to drive you home. You will need someone with you for 24 hours post procedure.     - Expect to be in the procedural area approximately 2 hours.   - If you feel as if you go back into normal rhythm prior to scheduled cardioversion, please notify our office immediately.   If your procedure is canceled in the cardioversion suite you will be charged a cancellation fee.      Hold below medications 72 hours prior to scheduled procedure/anesthesia. Restart medication on the following day after scheduled procedure/anesthesia  Dapagliflozin  (Farxiga )    For those patients who have a scheduled procedure/anesthesia on the same day of the week as their dose, hold the medication on the day of surgery.  They can take their scheduled dose the week before.  **Patients on the above medications scheduled for elective procedures that have not held the medication for the appropriate amount of time are at risk of cancellation or change in the anesthetic plan.

## 2024-05-24 NOTE — Progress Notes (Signed)
 Primary Care Physician: Lucius Krabbe, NP Referring Physician: Harlene Gainer, FNP Primary EP: Dr Inocencio [facility]: Dr Cherrie Nottingham Timothy Moon is a 68 y.o. male with a h/o of poorly controlled HTN, ETOH use, tobacco use and cocaine use, and recently diagnosed with HFrEF.   Admitted to [facility] 1/23 with Acute HFrEF.  Echo showed moderately reduced LVEF 30 to 35%, mod LVH w/ GIIDD, normal RV. Chest CT also showed thoracic aortic aneurysm. Aortic root measures 4.7 to 4.9 cm. Coronary atherosclerosis also noted. UDS negative. Diuresed w/ IV Lasix . Did not undergo LHC given renal insufficiency, which also limited medical therapy. SCr 2.2 (baseline unknown). No ARNi/ARB/spiro/dig. Placed on Imdur /hydarl, coreg  and farxiga . D/c wt 249 lb.   He was seen in the [facility] 1/23 and referred to [facility]. Hypertensive at the visit. Coreg  and hydralazine  increased. Also started on farxiga . - Echo 10/19/21 EF 30-35%. Global HK, moderate ventricular hypertrophy, grade II DD, RV normal Aneurysm of the ascending aorta, measuring 43 mm.   He was seen by Harlene Gainer, FNP, 11/24/21 and was started on eliquis  5 mg bid and referred to afib clinic to discuss cardioversion. However, he states today that he has only been taking  eliquis  1x daily since 2/9 . His HR's at home in afib  are in the 60-80's.   He is here with his friend today. He states no recent cocaine. Denies alcohol use. He does not appear to be symptomatic with the afib. States compliance with BB. Recently increased.   F/u in afib clinic, 08/11/22. I have not seen since February at which time he was suppose to see me back in 2 weeks to have a cardioversion. He missed Dr. Nelle visit around that time as well. He recently aw Harlene Gainer, NP, 10/24 and was still in afib. He is here now to see about obtaining cardioversion. In the meantime, pt states that he has been feeling all right. He has a rx for eliquis  thru the mail  coming to him, but he was given samples today to start back tonight. After 21 days on anticoagulation, we discussed going on to have a cardioversion. He is in agreement.   F/u in afib clinic, pt states that he has been back on eliquis  5 mg bid since 10/27. No missed doses since then. He remains in afib at 111 bpm. Fluid status stable. He wants to proceed with cardioversion.   F/u  afib clinic, 09/22/22 after successful cardioversion from 09/07/22 but unfortunately ERAF. I will refer to EP to be considered for ablation. EKG shows Afib at 96 bpm.   Follow up 04/24/24. Patient returns for follow up for atrial fibrillation. He was seen by Dr Inocencio 11/03/22 and amiodarone  was recommended. Patient was concerned about the possible side effects and never started the medication. He was lost to follow up. He presented to the ED 04/17/24 with right sided weakness and aphasia. He had not been taking Eliquis  due to loss of coverage. Head CT and MRI did not show acute CVA. He remains in afib today. He has been taking Eliquis  consistently since his ED visit. No bleeding issues.   Follow up 05/24/24. Patient returns for follow up for atrial fibrillation. He remains in rate controlled afib. He states his fatigue has improved since starting amiodarone . No bleeding issues on anticoagulation.    Today, he  denies symptoms of palpitations, chest pain, shortness of breath, orthopnea, PND, lower extremity edema, dizziness, presyncope, syncope, snoring, daytime  somnolence, bleeding, or neurologic sequela. The patient is tolerating medications without difficulties and is otherwise without complaint today.    Past Medical History:  Diagnosis Date   Acute congestive heart failure (HCC) 10/19/2021   Ascending aortic aneurysm (HCC) 11/23/2021   4.9 cm in January 2023   Chronic kidney disease, stage 3a (HCC) 10/19/2021   Cocaine abuse (HCC) 10/19/2021   Elevated troponin level not due myocardial infarction 10/19/2021   Essential  hypertension 10/19/2021   Nicotine dependence, cigarettes, uncomplicated 10/19/2021     Current Outpatient Medications  Medication Sig Dispense Refill   amiodarone  (PACERONE ) 200 MG tablet Take 2 tablets (400 mg total) TWICE a day for 2 weeks, then take 1 tablet (200 mg total) TWICE a day for 2 weeks, then take 1 tablet (200 mg total) ONCE a day 90 tablet 1   apixaban  (ELIQUIS ) 5 MG TABS tablet Take 1 tablet (5 mg total) by mouth 2 (two) times daily. 60 tablet 3   aspirin EC 81 MG tablet Take 81-162 mg by mouth every 4 (four) hours as needed for moderate pain (pain score 4-6). Swallow whole.     carvedilol  (COREG ) 12.5 MG tablet Take 1 tablet (12.5 mg total) by mouth 2 (two) times daily with a meal. NEEDS FOLLOW UP APPOINTMENT FOR MORE REFILLS 90 tablet 1   dapagliflozin  propanediol (FARXIGA ) 10 MG TABS tablet Take 1 tablet (10 mg total) by mouth daily. 30 tablet 0   furosemide  (LASIX ) 40 MG tablet Take 2 tablets (80 mg total) by mouth daily. 120 tablet 0   hydrALAZINE  (APRESOLINE ) 100 MG tablet Take 1 tablet (100 mg total) by mouth 3 (three) times daily. Pt must schedule follow up visit with cardiology for further refills 3rd attempt. 45 tablet 0   isosorbide  mononitrate (IMDUR ) 60 MG 24 hr tablet Take 1 tablet (60 mg total) by mouth daily. NEEDS FOLLOW UP APPOINTMENT FOR MORE REFILLS 90 tablet 0   Multiple Vitamin (MULTIVITAMIN) capsule Take 1 capsule by mouth as needed. Centrum Silver for men 90 capsule 0   nitroGLYCERIN  (NITROSTAT ) 0.4 MG SL tablet Place 1 tablet (0.4 mg total) under the tongue every 5 (five) minutes as needed for chest pain. 30 tablet 12   potassium chloride  SA (KLOR-CON  M) 20 MEQ tablet Take 1 tablet (20 mEq total) by mouth daily. NEEDS FOLLOW UP APPOINTMENT FOR MORE REFILLS 90 tablet 0   No current facility-administered medications for this encounter.    ROS- All systems are reviewed and negative except as per the HPI above  Physical Exam: Vitals:   05/24/24 1523  BP:  (!) 150/100  Pulse: 73  Weight: 114.9 kg  Height: 6' 3 (1.905 m)     Wt Readings from Last 3 Encounters:  05/24/24 114.9 kg  04/24/24 113.2 kg  04/17/24 123.4 kg    GEN: Well nourished, well developed in no acute distress CARDIAC: Irregularly irregular rate and rhythm, no murmurs, rubs, gallops RESPIRATORY:  Clear to auscultation without rales, wheezing or rhonchi  ABDOMEN: Soft, non-tender, non-distended EXTREMITIES:  No edema; No deformity    ECG today demonstrates Afib, IVCD Vent. rate 73 BPM PR interval * ms QRS duration 124 ms QT/QTcB 452/497 ms   CHA2DS2-VASc Score = 6  The patient's score is based upon: CHF History: 1 HTN History: 1 Diabetes History: 0 Stroke History: 2 (TIA) Vascular Disease History: 1 Age Score: 1 Gender Score: 0       ASSESSMENT AND PLAN: Longstanding Persistent Atrial Fibrillation (ICD10:  I48.11)  The patient's CHA2DS2-VASc score is 6, indicating a 9.7% annual risk of stroke.   Patient remains in rate controlled afib.  Continue amiodarone  400 mg BID x 1 weeks then decrease to 200 mg BID x 2 weeks, then decrease to 200 mg daily. Will arrange for DCCV once he finishes loading on amiodarone . If he is able to maintain SR, he may be a candidate for ablation.  Check bmet/cbc Continue Eliquis  5 mg BID Continue carvedilol  12.5 mg BID  Secondary Hypercoagulable State (ICD10:  D68.69) The patient is at significant risk for stroke/thromboembolism based upon his CHA2DS2-VASc Score of 6.  Continue Apixaban  (Eliquis ). No bleeding issues.   High Risk Medication Monitoring (ICD 10: Z79.899) Intervals on ECG acceptable for amiodarone  monitoring.   Chronic HFrEF EF 30-35% GDMT per [facility] team Fluid status appears stable today  Thoracic aortic aneurysm 5 cm. Scheduled for follow up with Dr Kerrin later this month.   HTN Historically difficult to control.  Will reassess in SR.   Follow up in the AF clinic post DCCV.     Daril Kicks  PA-C Afib [facility] [address] [place], [place] [address] [phone]

## 2024-05-25 LAB — CBC
Hematocrit: 50.6 % (ref 37.5–51.0)
Hemoglobin: 16.1 g/dL (ref 13.0–17.7)
MCH: 29.1 pg (ref 26.6–33.0)
MCHC: 31.8 g/dL (ref 31.5–35.7)
MCV: 92 fL (ref 79–97)
Platelets: 202 x10E3/uL (ref 150–450)
RBC: 5.53 x10E6/uL (ref 4.14–5.80)
RDW: 14.7 % (ref 11.6–15.4)
WBC: 8.4 x10E3/uL (ref 3.4–10.8)

## 2024-05-25 LAB — BASIC METABOLIC PANEL WITH GFR
BUN/Creatinine Ratio: 15 (ref 10–24)
BUN: 31 mg/dL — ABNORMAL HIGH (ref 8–27)
CO2: 21 mmol/L (ref 20–29)
Calcium: 9.7 mg/dL (ref 8.6–10.2)
Chloride: 104 mmol/L (ref 96–106)
Creatinine, Ser: 2.02 mg/dL — ABNORMAL HIGH (ref 0.76–1.27)
Glucose: 98 mg/dL (ref 70–99)
Potassium: 4.3 mmol/L (ref 3.5–5.2)
Sodium: 144 mmol/L (ref 134–144)
eGFR: 35 mL/min/1.73 — ABNORMAL LOW (ref >59)

## 2024-05-27 ENCOUNTER — Ambulatory Visit (HOSPITAL_COMMUNITY): Payer: Self-pay | Admitting: Physician Assistant

## 2024-05-28 ENCOUNTER — Other Ambulatory Visit (HOSPITAL_COMMUNITY): Payer: Self-pay

## 2024-06-05 ENCOUNTER — Ambulatory Visit (HOSPITAL_COMMUNITY)
Admission: RE | Admit: 2024-06-05 | Discharge: 2024-06-05 | Disposition: A | Discharge status: Home / Self Care | Source: Ambulatory Visit | Attending: Thoracic Surgery (Cardiothoracic Vascular Surgery) | Admitting: Thoracic Surgery (Cardiothoracic Vascular Surgery)

## 2024-06-05 DIAGNOSIS — I712 Thoracic aortic aneurysm, without rupture, unspecified: Secondary | ICD-10-CM | POA: Diagnosis not present

## 2024-06-05 DIAGNOSIS — I7121 Aneurysm of the ascending aorta, without rupture: Secondary | ICD-10-CM | POA: Diagnosis not present

## 2024-06-05 DIAGNOSIS — E041 Nontoxic single thyroid nodule: Secondary | ICD-10-CM | POA: Diagnosis not present

## 2024-06-05 DIAGNOSIS — J432 Centrilobular emphysema: Secondary | ICD-10-CM | POA: Diagnosis not present

## 2024-06-11 ENCOUNTER — Ambulatory Visit: Admitting: Thoracic Surgery (Cardiothoracic Vascular Surgery)

## 2024-06-11 ENCOUNTER — Inpatient Hospital Stay (HOSPITAL_COMMUNITY)
Admission: EM | Admit: 2024-06-11 | Discharge: 2024-06-15 | DRG: 287 | Disposition: A | Discharge status: Home / Self Care | Attending: Internal Medicine | Admitting: Internal Medicine

## 2024-06-11 ENCOUNTER — Other Ambulatory Visit: Payer: Self-pay

## 2024-06-11 ENCOUNTER — Emergency Department (HOSPITAL_COMMUNITY)

## 2024-06-11 DIAGNOSIS — I5022 Chronic systolic (congestive) heart failure: Secondary | ICD-10-CM | POA: Diagnosis present

## 2024-06-11 DIAGNOSIS — Z79899 Other long term (current) drug therapy: Secondary | ICD-10-CM

## 2024-06-11 DIAGNOSIS — E871 Hypo-osmolality and hyponatremia: Secondary | ICD-10-CM | POA: Diagnosis present

## 2024-06-11 DIAGNOSIS — I428 Other cardiomyopathies: Secondary | ICD-10-CM | POA: Diagnosis not present

## 2024-06-11 DIAGNOSIS — S0990XA Unspecified injury of head, initial encounter: Secondary | ICD-10-CM

## 2024-06-11 DIAGNOSIS — R55 Syncope and collapse: Secondary | ICD-10-CM | POA: Diagnosis present

## 2024-06-11 DIAGNOSIS — I472 Ventricular tachycardia, unspecified: Secondary | ICD-10-CM | POA: Diagnosis not present

## 2024-06-11 DIAGNOSIS — Y92480 Sidewalk as the place of occurrence of the external cause: Secondary | ICD-10-CM

## 2024-06-11 DIAGNOSIS — R000 Tachycardia, unspecified: Secondary | ICD-10-CM | POA: Diagnosis not present

## 2024-06-11 DIAGNOSIS — Z6831 Body mass index (BMI) 31.0-31.9, adult: Secondary | ICD-10-CM

## 2024-06-11 DIAGNOSIS — I4819 Other persistent atrial fibrillation: Secondary | ICD-10-CM | POA: Diagnosis not present

## 2024-06-11 DIAGNOSIS — Z91138 Patient's unintentional underdosing of medication regimen for other reason: Secondary | ICD-10-CM

## 2024-06-11 DIAGNOSIS — N189 Chronic kidney disease, unspecified: Secondary | ICD-10-CM | POA: Diagnosis present

## 2024-06-11 DIAGNOSIS — Z8673 Personal history of transient ischemic attack (TIA), and cerebral infarction without residual deficits: Secondary | ICD-10-CM | POA: Diagnosis not present

## 2024-06-11 DIAGNOSIS — N179 Acute kidney failure, unspecified: Secondary | ICD-10-CM | POA: Diagnosis present

## 2024-06-11 DIAGNOSIS — E669 Obesity, unspecified: Secondary | ICD-10-CM | POA: Diagnosis present

## 2024-06-11 DIAGNOSIS — I7121 Aneurysm of the ascending aorta, without rupture: Secondary | ICD-10-CM | POA: Diagnosis not present

## 2024-06-11 DIAGNOSIS — F17210 Nicotine dependence, cigarettes, uncomplicated: Secondary | ICD-10-CM | POA: Diagnosis present

## 2024-06-11 DIAGNOSIS — F1910 Other psychoactive substance abuse, uncomplicated: Secondary | ICD-10-CM | POA: Diagnosis present

## 2024-06-11 DIAGNOSIS — S0101XA Laceration without foreign body of scalp, initial encounter: Secondary | ICD-10-CM | POA: Diagnosis not present

## 2024-06-11 DIAGNOSIS — I5042 Chronic combined systolic (congestive) and diastolic (congestive) heart failure: Secondary | ICD-10-CM

## 2024-06-11 DIAGNOSIS — E861 Hypovolemia: Secondary | ICD-10-CM | POA: Diagnosis present

## 2024-06-11 DIAGNOSIS — Z7901 Long term (current) use of anticoagulants: Secondary | ICD-10-CM

## 2024-06-11 DIAGNOSIS — I480 Paroxysmal atrial fibrillation: Secondary | ICD-10-CM | POA: Diagnosis present

## 2024-06-11 DIAGNOSIS — F32A Depression, unspecified: Secondary | ICD-10-CM | POA: Diagnosis not present

## 2024-06-11 DIAGNOSIS — N1832 Chronic kidney disease, stage 3b: Secondary | ICD-10-CM | POA: Diagnosis present

## 2024-06-11 DIAGNOSIS — I4891 Unspecified atrial fibrillation: Secondary | ICD-10-CM | POA: Diagnosis not present

## 2024-06-11 DIAGNOSIS — I517 Cardiomegaly: Secondary | ICD-10-CM | POA: Diagnosis not present

## 2024-06-11 DIAGNOSIS — I272 Pulmonary hypertension, unspecified: Secondary | ICD-10-CM | POA: Diagnosis present

## 2024-06-11 DIAGNOSIS — I10 Essential (primary) hypertension: Secondary | ICD-10-CM | POA: Diagnosis present

## 2024-06-11 DIAGNOSIS — I4729 Other ventricular tachycardia: Principal | ICD-10-CM | POA: Diagnosis present

## 2024-06-11 DIAGNOSIS — W19XXXA Unspecified fall, initial encounter: Secondary | ICD-10-CM | POA: Diagnosis not present

## 2024-06-11 DIAGNOSIS — D751 Secondary polycythemia: Secondary | ICD-10-CM | POA: Diagnosis present

## 2024-06-11 DIAGNOSIS — I255 Ischemic cardiomyopathy: Secondary | ICD-10-CM | POA: Diagnosis present

## 2024-06-11 DIAGNOSIS — I771 Stricture of artery: Secondary | ICD-10-CM | POA: Diagnosis present

## 2024-06-11 DIAGNOSIS — W010XXA Fall on same level from slipping, tripping and stumbling without subsequent striking against object, initial encounter: Secondary | ICD-10-CM | POA: Diagnosis present

## 2024-06-11 DIAGNOSIS — D72829 Elevated white blood cell count, unspecified: Secondary | ICD-10-CM | POA: Diagnosis not present

## 2024-06-11 DIAGNOSIS — T45516A Underdosing of anticoagulants, initial encounter: Secondary | ICD-10-CM | POA: Diagnosis present

## 2024-06-11 DIAGNOSIS — S299XXA Unspecified injury of thorax, initial encounter: Secondary | ICD-10-CM | POA: Diagnosis not present

## 2024-06-11 DIAGNOSIS — I130 Hypertensive heart and chronic kidney disease with heart failure and stage 1 through stage 4 chronic kidney disease, or unspecified chronic kidney disease: Secondary | ICD-10-CM | POA: Diagnosis present

## 2024-06-11 DIAGNOSIS — I471 Supraventricular tachycardia, unspecified: Secondary | ICD-10-CM | POA: Diagnosis not present

## 2024-06-11 DIAGNOSIS — E66811 Obesity, class 1: Secondary | ICD-10-CM | POA: Diagnosis present

## 2024-06-11 DIAGNOSIS — E876 Hypokalemia: Secondary | ICD-10-CM | POA: Diagnosis present

## 2024-06-11 DIAGNOSIS — I2510 Atherosclerotic heart disease of native coronary artery without angina pectoris: Secondary | ICD-10-CM | POA: Diagnosis present

## 2024-06-11 DIAGNOSIS — R7401 Elevation of levels of liver transaminase levels: Secondary | ICD-10-CM | POA: Diagnosis present

## 2024-06-11 DIAGNOSIS — R231 Pallor: Secondary | ICD-10-CM | POA: Diagnosis not present

## 2024-06-11 DIAGNOSIS — F1410 Cocaine abuse, uncomplicated: Secondary | ICD-10-CM | POA: Diagnosis present

## 2024-06-11 DIAGNOSIS — I447 Left bundle-branch block, unspecified: Secondary | ICD-10-CM | POA: Diagnosis present

## 2024-06-11 HISTORY — DX: Elevated white blood cell count, unspecified: D72.829

## 2024-06-11 HISTORY — DX: Ventricular tachycardia, unspecified: I47.20

## 2024-06-11 HISTORY — DX: Secondary polycythemia: D75.1

## 2024-06-11 HISTORY — DX: Hypo-osmolality and hyponatremia: E87.1

## 2024-06-11 LAB — CBC WITH DIFFERENTIAL/PLATELET
Abs Immature Granulocytes: 0.04 K/uL (ref 0.00–0.07)
Basophils Absolute: 0.1 K/uL (ref 0.0–0.1)
Basophils Relative: 1 %
Eosinophils Absolute: 0.2 K/uL (ref 0.0–0.5)
Eosinophils Relative: 2 %
HCT: 54.2 % — ABNORMAL HIGH (ref 39.0–52.0)
Hemoglobin: 18.1 g/dL — ABNORMAL HIGH (ref 13.0–17.0)
Immature Granulocytes: 0 %
Lymphocytes Relative: 10 %
Lymphs Abs: 1.3 K/uL (ref 0.7–4.0)
MCH: 29.3 pg (ref 26.0–34.0)
MCHC: 33.4 g/dL (ref 30.0–36.0)
MCV: 87.8 fL (ref 80.0–100.0)
Monocytes Absolute: 0.9 K/uL (ref 0.1–1.0)
Monocytes Relative: 7 %
Neutro Abs: 10.3 K/uL — ABNORMAL HIGH (ref 1.7–7.7)
Neutrophils Relative %: 80 %
Platelets: 220 K/uL (ref 150–400)
RBC: 6.17 MIL/uL — ABNORMAL HIGH (ref 4.22–5.81)
RDW: 14.8 % (ref 11.5–15.5)
WBC: 12.8 K/uL — ABNORMAL HIGH (ref 4.0–10.5)
nRBC: 0 % (ref 0.0–0.2)

## 2024-06-11 LAB — COMPREHENSIVE METABOLIC PANEL WITH GFR
ALT: 33 U/L (ref 0–44)
AST: 46 U/L — ABNORMAL HIGH (ref 15–41)
Albumin: 4 g/dL (ref 3.5–5.0)
Alkaline Phosphatase: 86 U/L (ref 38–126)
Anion gap: 12 (ref 5–15)
BUN: 39 mg/dL — ABNORMAL HIGH (ref 8–23)
CO2: 20 mmol/L — ABNORMAL LOW (ref 22–32)
Calcium: 9.7 mg/dL (ref 8.9–10.3)
Chloride: 99 mmol/L (ref 98–111)
Creatinine, Ser: 2.48 mg/dL — ABNORMAL HIGH (ref 0.61–1.24)
GFR, Estimated: 28 mL/min — ABNORMAL LOW (ref >60)
Glucose, Bld: 120 mg/dL — ABNORMAL HIGH (ref 70–99)
Potassium: 3.4 mmol/L — ABNORMAL LOW (ref 3.5–5.1)
Sodium: 131 mmol/L — ABNORMAL LOW (ref 135–145)
Total Bilirubin: 1.8 mg/dL — ABNORMAL HIGH (ref 0.0–1.2)
Total Protein: 8 g/dL (ref 6.5–8.1)

## 2024-06-11 LAB — RAPID URINE DRUG SCREEN, HOSP PERFORMED
Amphetamines: NOT DETECTED
Barbiturates: NOT DETECTED
Benzodiazepines: NOT DETECTED
Cocaine: POSITIVE — AB
Opiates: NOT DETECTED
Tetrahydrocannabinol: NOT DETECTED

## 2024-06-11 LAB — CK: Total CK: 738 U/L — ABNORMAL HIGH (ref 49–397)

## 2024-06-11 LAB — PROTIME-INR
INR: 1.3 — ABNORMAL HIGH (ref 0.8–1.2)
Prothrombin Time: 16.5 s — ABNORMAL HIGH (ref 11.4–15.2)

## 2024-06-11 LAB — MAGNESIUM: Magnesium: 2.4 mg/dL (ref 1.7–2.4)

## 2024-06-11 LAB — TROPONIN I (HIGH SENSITIVITY)
Troponin I (High Sensitivity): 2090 ng/L (ref <18)
Troponin I (High Sensitivity): 2005 ng/L (ref <18)

## 2024-06-11 LAB — SAMPLE TO BLOOD BANK

## 2024-06-11 LAB — HIV ANTIBODY (ROUTINE TESTING W REFLEX): HIV Screen 4th Generation wRfx: NONREACTIVE

## 2024-06-11 MED ORDER — SODIUM CHLORIDE 0.9% FLUSH
3.0000 mL | Freq: Two times a day (BID) | INTRAVENOUS | Status: DC
  Administered 2024-06-11 – 2024-06-14 (×4): 3 mL via INTRAVENOUS

## 2024-06-11 MED ORDER — ALBUTEROL SULFATE (2.5 MG/3ML) 0.083% IN NEBU: 2.5000 mg | INHALATION_SOLUTION | Freq: Four times a day (QID) | RESPIRATORY_TRACT | Status: DC | PRN

## 2024-06-11 MED ORDER — HEPARIN BOLUS VIA INFUSION
4000.0000 [IU] | Freq: Once | INTRAVENOUS | Status: AC
  Administered 2024-06-11: 4000 [IU] via INTRAVENOUS
  Filled 2024-06-11: qty 4000

## 2024-06-11 MED ORDER — APIXABAN 5 MG PO TABS: 5.0000 mg | ORAL_TABLET | Freq: Two times a day (BID) | ORAL | Status: DC

## 2024-06-11 MED ORDER — ACETAMINOPHEN 325 MG PO TABS
650.0000 mg | ORAL_TABLET | Freq: Four times a day (QID) | ORAL | Status: DC | PRN
Start: 2024-06-11 — End: 2024-06-15

## 2024-06-11 MED ORDER — TRIMETHOBENZAMIDE HCL 100 MG/ML IM SOLN: 200.0000 mg | Freq: Four times a day (QID) | INTRAMUSCULAR | Status: DC | PRN

## 2024-06-11 MED ORDER — SODIUM CHLORIDE 0.9 % IV SOLN: INTRAVENOUS | Status: DC

## 2024-06-11 MED ORDER — POTASSIUM CHLORIDE CRYS ER 20 MEQ PO TBCR
40.0000 meq | EXTENDED_RELEASE_TABLET | ORAL | Status: AC
  Administered 2024-06-11: 40 meq via ORAL
  Filled 2024-06-11: qty 2

## 2024-06-11 MED ORDER — AMIODARONE HCL 200 MG PO TABS
200.0000 mg | ORAL_TABLET | Freq: Every day | ORAL | Status: DC
  Administered 2024-06-11 – 2024-06-15 (×5): 200 mg via ORAL
  Filled 2024-06-11 (×5): qty 1

## 2024-06-11 MED ORDER — ASPIRIN 81 MG PO CHEW
81.0000 mg | CHEWABLE_TABLET | ORAL | Status: AC
Start: 2024-06-12 — End: 2024-06-13
  Administered 2024-06-12: 81 mg via ORAL
  Filled 2024-06-11: qty 1

## 2024-06-11 MED ORDER — HEPARIN (PORCINE) 25000 UT/250ML-% IV SOLN
1400.0000 [IU]/h | INTRAVENOUS | Status: DC
  Administered 2024-06-11: 1300 [IU]/h via INTRAVENOUS
  Filled 2024-06-11 (×2): qty 250

## 2024-06-11 MED ORDER — CARVEDILOL 12.5 MG PO TABS
12.5000 mg | ORAL_TABLET | Freq: Two times a day (BID) | ORAL | Status: DC
  Administered 2024-06-11 – 2024-06-15 (×8): 12.5 mg via ORAL
  Filled 2024-06-11 (×8): qty 1

## 2024-06-11 MED ORDER — ACETAMINOPHEN 650 MG RE SUPP: 650.0000 mg | Freq: Four times a day (QID) | RECTAL | Status: DC | PRN

## 2024-06-11 NOTE — Progress Notes (Signed)
 PHARMACY - ANTICOAGULATION CONSULT NOTE  Pharmacy Consult for Heparin  Indication: chest pain/ACS  No Known Allergies  Patient Measurements: Height: 6' 3 (190.5 cm) Weight: 114.9 kg (253 lb 3.2 oz) IBW/kg (Calculated) : 84.5 HEPARIN  DW (KG): 108.4  Vital Signs: Temp: 97.9 F (36.6 C) (08/26 2005) Temp Source: Oral (08/26 2005) BP: 116/88 (08/26 2005) Pulse Rate: 63 (08/26 2005)  Labs: Recent Labs    06/11/24 1346 06/11/24 1621 06/11/24 1916  HGB 18.1*  --   --   HCT 54.2*  --   --   PLT 220  --   --   LABPROT 16.5*  --   --   INR 1.3*  --   --   CREATININE 2.48*  --   --   CKTOTAL  --  738*  --   TROPONINIHS  --   --  2,090*    Estimated Creatinine Clearance: 39.5 mL/min (A) (by C-G formula based on SCr of 2.48 mg/dL (H)).   Medical History: Past Medical History:  Diagnosis Date   Acute congestive heart failure (HCC) 10/19/2021   Ascending aortic aneurysm (HCC) 11/23/2021   4.9 cm in January 2023   Chronic kidney disease, stage 3a (HCC) 10/19/2021   Cocaine abuse (HCC) 10/19/2021   Elevated troponin level not due myocardial infarction 10/19/2021   Essential hypertension 10/19/2021   Nicotine dependence, cigarettes, uncomplicated 10/19/2021    Medications:  Infusions:   sodium chloride  75 mL/hr at 06/11/24 1721    Assessment: Pharmacy consulted for Heparin  for ACS/STEMI. Patient on eliquis  at home, last dose 8/25 at 1900. Will order paired heparin  levels and aPTTs for monitoring until correlating.   Goal of Therapy:  Heparin  level 0.3-0.7 units/ml aPTT 66-102 seconds Monitor platelets by anticoagulation protocol: Yes   Plan:  Give 4000 units bolus x 1 Start heparin  infusion at 1300 units/hr Check anti-Xa level in 8 hours and daily while on heparin  Continue to monitor H&H and platelets  Jeriyah Granlund CHRISTELLA Brazier 06/11/2024,9:32 PM

## 2024-06-11 NOTE — H&P (Signed)
 History and Physical    Patient: Timothy Moon FMW:[id number] DOB: [birth year] DOA: 06/11/2024 DOS: the patient was seen and examined on 06/11/2024 PCP: Lucius Krabbe, NP  Patient coming from: via EMS  Chief Complaint:  Chief Complaint  Patient presents with   Fall   HPI: Timothy Moon is a 68 y.o. male with medical history significant of HTN, HFrEF, A-fib on Eliquis , chronic kidney disease stage III, polysubstance abuse presents after a fall.  He experienced a fall while visiting a friend at a hotel, tripping over an 'expansion crack' in the walkway in the that he thinks was due to the sandals he was wearing. No heart racing or palpitations at the time of the fall, describing the event as 'all of a sudden down on the ground.'  He did admit to striking the back of his head on a wooden banister.  He has a history of taking Eliquis  regularly but missed his dose on the day of the fall. He confirms usual compliance with his medication schedule.  He has a history of cocaine use, typically using once a month, with the last use being one month ago. He also has a history of smoking, currently smoking very little, less than a pack a week, and finds the smoke 'annoys' him now.  No recent fevers, chills, chest pain, nausea, vomiting, or shortness of breath. He is not on oxygen normally and does not use CPAP or BiPAP for sleep apnea, although he mentions an incomplete test for sleep apnea in the past.  With EMS patient was reported to have a loss of pulses with heart rates reportedly in the 200s in [place] for which patient was cardioverted to a normal sinus rhythm.  In the ED patient was noted to be afebrile with blood pressures 108/76 to 140/80, and all other vital signs maintained.  Labs noted WBC 12.8, hemoglobin 18.1, potassium 3.4, BUN 39, creatinine 2.48.  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  Chest x-ray showed no acute abnormality  with mild cardiac enlargement.  Patient had been ordered Eliquis , amiodarone  200 mg p.o., and Coreg  12.5 mg.  TRH consulted to admit.  Review of Systems: As mentioned in the history of present illness. All other systems reviewed and are negative. Past Medical History:  Diagnosis Date   Acute congestive heart failure (HCC) 10/19/2021   Ascending aortic aneurysm (HCC) 11/23/2021   4.9 cm in January 2023   Chronic kidney disease, stage 3a (HCC) 10/19/2021   Cocaine abuse (HCC) 10/19/2021   Elevated troponin level not due myocardial infarction 10/19/2021   Essential hypertension 10/19/2021   Nicotine dependence, cigarettes, uncomplicated 10/19/2021   Past Surgical History:  Procedure Laterality Date   CARDIOVERSION N/A 09/07/2022   Procedure: CARDIOVERSION;  Surgeon: Hobart Powell BRAVO, MD;  Location: [facility] ENDOSCOPY;  Service: Cardiovascular;  Laterality: N/A;   Social History:  reports that he quit smoking about 2 years ago. His smoking use included cigarettes. He started smoking about 22 years ago. He has a 20 pack-year smoking history. He has never used smokeless tobacco. He reports that he does not currently use alcohol. He reports that he does not currently use drugs after having used the following drugs: Cocaine.  No Known Allergies  Family History  Problem Relation Age of Onset   Heart disease Neg Hx     Prior to Admission medications   Medication Sig Start Date End Date Taking? Authorizing Provider  amiodarone  (PACERONE )  200 MG tablet Take 2 tablets (400 mg total) TWICE a day for 2 weeks, then take 1 tablet (200 mg total) TWICE a day for 2 weeks, then take 1 tablet (200 mg total) ONCE a day 04/24/24 08/20/24  Fenton, Clint R, PA  apixaban  (ELIQUIS ) 5 MG TABS tablet Take 1 tablet (5 mg total) by mouth 2 (two) times daily. 04/24/24   Fenton, Clint R, PA  aspirin  EC 81 MG tablet Take 81-162 mg by mouth every 4 (four) hours as needed for moderate pain (pain score 4-6). Swallow whole.    [provider]  carvedilol  (COREG ) 12.5 MG tablet Take 1 tablet (12.5 mg total) by mouth 2 (two) times daily with a meal. NEEDS FOLLOW UP APPOINTMENT FOR MORE REFILLS 04/24/24   Fenton, Clint R, PA  dapagliflozin  propanediol (FARXIGA ) 10 MG TABS tablet Take 1 tablet (10 mg total) by mouth daily. 01/08/24   Bensimhon, Toribio SAUNDERS, MD  furosemide  (LASIX ) 40 MG tablet Take 2 tablets (80 mg total) by mouth daily. 05/24/24   Fenton, Clint R, PA  hydrALAZINE  (APRESOLINE ) 100 MG tablet Take 1 tablet (100 mg total) by mouth 3 (three) times daily. Pt must schedule follow up visit with cardiology for further refills 3rd attempt. 05/24/24   Fenton, Clint R, PA  isosorbide  mononitrate (IMDUR ) 60 MG 24 hr tablet Take 1 tablet (60 mg total) by mouth daily. NEEDS FOLLOW UP APPOINTMENT FOR MORE REFILLS 05/24/24   Fenton, Clint R, PA  Multiple Vitamin (MULTIVITAMIN) capsule Take 1 capsule by mouth as needed. Centrum Silver for men 10/02/23   Lucius Krabbe, NP  nitroGLYCERIN  (NITROSTAT ) 0.4 MG SL tablet Place 1 tablet (0.4 mg total) under the tongue every 5 (five) minutes as needed for chest pain. 10/21/21   Leotis Bogus, MD  potassium chloride  SA (KLOR-CON  M) 20 MEQ tablet Take 1 tablet (20 mEq total) by mouth daily. NEEDS FOLLOW UP APPOINTMENT FOR MORE REFILLS 01/08/24   Bensimhon, Toribio SAUNDERS, MD    Physical Exam: Vitals:   06/11/24 1355 06/11/24 1356 06/11/24 1430 06/11/24 1600  BP: (!) 140/80  (!) 129/90 118/81  Pulse: 80   68  Resp:   20 18  Temp:      TempSrc:      SpO2: 94% 94%  93%  Weight:      Height:       Constitutional: Elderly male who appears to be in no acute distress and able to follow commands Eyes: PERRL, lids and conjunctivae normal ENMT: Mucous membranes are moist. Normal dentition.  Neck: normal, supple, no JVD appreciated at this time. Respiratory: Decreased aeration but no significant signs of wheezing or rhonchi.  Currently on 2 L nasal cannula oxygen Cardiovascular: Irregular irregular no  extremity edema. 2+ pedal pulses. No carotid bruits.  Abdomen: Protuberant abdomen, no tenderness, no masses palpated. No hepatosplenomegaly. Bowel sounds positive.  Musculoskeletal: no clubbing / cyanosis. No joint deformity upper and lower extremities. Good ROM, no contractures. Normal muscle tone.  Skin: Varicosities noted of the lower extremities.  Abrasion noted of the posterior scalp with minimal bleeding Neurologic: CN 2-12 grossly intact. Sensation intact, DTR normal. Strength 5/5 in all 4.  Psychiatric: Normal judgment and insight. Alert and oriented x 3. Normal mood.   Data Reviewed:  EKG appears to show atrial fibrillation at 86 bpm with QTc prolonged at 569.  Reviewed labs, imaging, and pertinent records as documented.  Assessment and Plan:  Suspected ventricular tachycardia Paroxysmal atrial fibrillation on chronic anticoagulation Acute.  Patient was reported to have loss of pulses with heart rates reportedly in the 200s in [place] which patient was cardioverted in the field.  Currently patient appears to be in atrial fibrillation currently rate controlled.   - Admit to a progressive bed - Goal potassium at least 4 and magnesium at least 2.  Replace electrolytes to goal - Check echocardiogram - Continue amiodarone  and Coreg  - Cardiology consulted,  will follow-up for any further recommendations  Fall Patient reports what sounds like a mechanical fall with sandals causing him to hit the back of his head.  On physical exam patient with small area of bleeding on the posterior scalp.  CT scan of the head did not note any acute intracranial abnormality. - Check CK  Leukocytosis Acute.  WBC elevated at 12.8.  Chest x-ray noted no acute abnormality.  Possibly reactive in nature. - Check urinalysis - Recheck CBC tomorrow morning  Acute kidney injury superimposed on chronic kidney disease stage IIIb Patient presents with creatinine elevated up to 2.48 with BUN 39.  Baseline creatinine  previously noted to be around 1.8-2. - Follow-up urinalysis - Avoid nephrotoxic agents - Gentle IV fluids at 75 mL/h - Recheck kidney function in a.m.  Erythrocytosis Acute.  WBC elevated 18.1.  Suspect possibly secondary to hemoconcentration. - Recheck CBC tomorrow morning  Hyponatremia Hypokalemia Acute.  Sodium 131 with potassium 3.4.   - Give potassium chloride  40 meq p.o. - IV fluids as noted above - Recheck BMP in a.m.  Heart failure with reduced ejection fraction Patient appears to be fairly euvolemic on physical exam at this time.  Last echocardiogram noted EF to be 30 to 35% with grade 2 diastolic dysfunction when checked back in 10/2021. - Strict I&O's and daily weights - Follow-up echocardiogram  Essential hypertension - Continue blood pressure regimen as tolerated  Polysubstance abuse Patient reported last use of cocaine approximately 1 month ago.  He continues to smoke cigarettes although smoking less than a pack per week. 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 Observation status is judged to be reasonable and necessary in order to provide the required intensity of service to ensure the patient's safety. The patient's presenting symptoms, physical exam findings, and initial radiographic and laboratory data in the context of their medical condition is felt to place them at decreased risk for further clinical deterioration. Furthermore, it is anticipated that the patient will be medically stable for discharge from the hospital within 2 midnights of admission.   Author: Maximino DELENA Sharps, MD 06/11/2024 4:11 PM  For on call review [email or web address].

## 2024-06-11 NOTE — ED Triage Notes (Signed)
 Patient arrives via [facility] from [place]. Patient had mechanical fall, patient tripped on shoes, hit head on wooden bannister, 2 lacerations to right side of posterior head. Upon EMS trying to get patient up, paramedic had no radial pulses or BP. Patient in SVT rate 200, cardioverted to NSR. ST depression. Last BP 140/80, 92 on room air placed on 2 liters [place] at 94, HR 80. No LOC. Patient pale cool clammy. Patient on eliquis  for afib.

## 2024-06-11 NOTE — Progress Notes (Signed)
 Text paged Dr  C. Anderson critical lab value [phone]. Reena Peak charge nurse aware.

## 2024-06-11 NOTE — Consult Note (Addendum)
 Cardiology Consultation   Patient ID: Timothy Moon MRN: [id number]; DOB: Jan 20, 1956  Admit date: 06/11/2024 Date of Consult: 06/11/2024  PCP:  Lucius Krabbe, NP   [facility] Providers Cardiologist:  Redell Shallow, MD  Electrophysiologist:  Soyla Gladis Norton, MD   Patient Profile: Timothy Moon is a 68 y.o. male with a hx of chronic HFrEF, persistent atrial fibrillation, thoracic aortic aneurysm 5 cm, cocaine use, nicotine dependence, hypertension, CKD who is being seen 06/11/2024 for the evaluation of VT at the request of ER.  History of Present Illness: Timothy Moon has history of chronic HFrEF with echocardiogram 10/2021 demonstrating an EF of 30 to 35%.  Patient did not undergo left heart catheterization given underlying renal disease.  He also has had persistent atrial fibrillation status post failed DCCV 08/2022 but with early recurrence of atrial fibrillation.  He had tentative plans for ablation however EP wanted him to have ischemic evaluation prior to procedure.  They had started amiodarone  with plans to cardiovert again and was scheduled for next month in September.  Unfortunately he has been lost in follow-up a couple times and has not actually been seen by a primary cardiologist or advanced heart failure since 08/2022.  He has only seen EP and A-fib clinic for the past 2 years.  Last seen in office 05/24/2024.  Additionally he has large aortic aneurysm measuring 5 cm on CT imaging 04/2022.  He missed appointment with CT surgery but actually had an appointment today but due to current admission unable to make.  Currently patient being evaluated for fall and reportedly SVT but EMS strips look like VT.  Reportedly rates were in the 200s.  EMS reported loss of distal pulses but no loss of consciousness.  No ACLS. He got direct cardioversion out in the field x 1 with restoration of atrial fibrillation with controlled ventricular rates.  Patient reports that he was  out of hotel visiting his friend.  His foot got stuck on the sidewalk in consequently fell and struck his head on a wooden banister resulting into a posterior scalp abrasion.  The hotel had called EMS and that is when they had found him in VT, subsequently shocked.  He reports he has had no issues of chest pain, palpitations, dizziness.  He did not fall due to any other reason besides mechanical tripping.  He reports compliancy with his medications but did not take all of them this morning.  Has not had any issues with orthopnea, shortness of breath, peripheral edema.  Does not have any significant DOE.  Has denied any issues with chest pain.  Reports he used cocaine about 1 month ago but trying to quit.  Denied alcohol use and quit drinking in 2012.  Reports smoking 1 pack about a week.  He has no acute complaints otherwise.  Says he has 1 close friend who is an Charity fundraiser but otherwise no close family, daughter lives in [place] .  CT of the head with no acute abnormalities.  Chest x-ray normal.  Potassium 3.4.  Creatinine 2.48.  Mildly elevated AST, 46.  WBC 12.8.  Hemoglobin 18.1.  Neutrophils 10.3.  UDS has not been obtained yet.  Past Medical History:  Diagnosis Date   Acute congestive heart failure (HCC) 10/19/2021   Ascending aortic aneurysm (HCC) 11/23/2021   4.9 cm in January 2023   Chronic kidney disease, stage 3a (HCC) 10/19/2021   Cocaine abuse (HCC) 10/19/2021   Elevated troponin level not due myocardial infarction 10/19/2021  Essential hypertension 10/19/2021   Nicotine dependence, cigarettes, uncomplicated 10/19/2021    Past Surgical History:  Procedure Laterality Date   CARDIOVERSION N/A 09/07/2022   Procedure: CARDIOVERSION;  Surgeon: Hobart Powell BRAVO, MD;  Location: [facility] ENDOSCOPY;  Service: Cardiovascular;  Laterality: N/A;     Scheduled Meds:  amiodarone   200 mg Oral Daily   apixaban   5 mg Oral BID   carvedilol   12.5 mg Oral BID WC   potassium chloride   40 mEq Oral STAT   sodium chloride   flush  3 mL Intravenous Q12H   Continuous Infusions:  sodium chloride      PRN Meds: acetaminophen  **OR** acetaminophen , albuterol , trimethobenzamide   Allergies:   No Known Allergies  Social History:   Social History   Socioeconomic History   Marital status: Divorced    Spouse name: Not on file   Number of children: 2   Years of education: Not on file   Highest education level: Associate degree: academic program  Occupational History   Occupation: retired  Tobacco Use   Smoking status: Former    Current packs/day: 0.00    Average packs/day: 1 pack/day for 20.0 years (20.0 ttl pk-yrs)    Types: Cigarettes    Start date: 10/17/2001    Quit date: 10/17/2021    Years since quitting: 2.6   Smokeless tobacco: Never   Tobacco comments:    Former smoker 04/24/24  Vaping Use   Vaping status: Never Used  Substance and Sexual Activity   Alcohol use: Not Currently    Comment: heavy drinker x 7 years, quit 11 years ago.   Drug use: Not Currently    Types: Cocaine    Comment: 1-2x/mo last use 10/09/2021   Sexual activity: Not Currently  Other Topics Concern   Not on file  Social History Narrative   Not on file   Social Drivers of Health   Financial Resource Strain: Low Risk  (07/25/2022)   Overall Financial Resource Strain (CARDIA)    Difficulty of Paying Living Expenses: Not very hard  Food Insecurity: No Food Insecurity (07/25/2022)   Hunger Vital Sign    Worried About Running Out of Food in the Last Year: Never true    Ran Out of Food in the Last Year: Never true  Transportation Needs: No Transportation Needs (07/25/2022)   PRAPARE - Administrator, Civil Service (Medical): No    Lack of Transportation (Non-Medical): No  Physical Activity: Inactive (07/25/2022)   Exercise Vital Sign    Days of Exercise per Week: 0 days    Minutes of Exercise per Session: 0 min  Stress: No Stress Concern Present (07/25/2022)   [employer] of Occupational Health -  Occupational Stress Questionnaire    Feeling of Stress : Only a little  Social Connections: Socially Isolated (07/25/2022)   Social Connection and Isolation Panel    Frequency of Communication with Friends and Family: More than three times a week    Frequency of Social Gatherings with Friends and Family: More than three times a week    Attends Religious Services: Never    Database administrator or Organizations: No    Attends Banker Meetings: Never    Marital Status: Divorced  Catering manager Violence: Not At Risk (07/25/2022)   Humiliation, Afraid, Rape, and Kick questionnaire    Fear of Current or Ex-Partner: No    Emotionally Abused: No    Physically Abused: No    Sexually Abused: No  Family History:   Family History  Problem Relation Age of Onset   Heart disease Neg Hx      ROS:  Please see the history of present illness.  All other ROS reviewed and negative.     Physical Exam/Data: Vitals:   06/11/24 1356 06/11/24 1430 06/11/24 1600 06/11/24 1615  BP:  (!) 129/90 118/81 105/83  Pulse:   68 90  Resp:  20 18 16   Temp:      TempSrc:      SpO2: 94%  93% 96%  Weight:      Height:       No intake or output data in the 24 hours ending 06/11/24 1654    06/11/2024    1:37 PM 05/24/2024    3:23 PM 04/24/2024    8:27 AM  Last 3 Weights  Weight (lbs) 253 lb 3.2 oz 253 lb 3.2 oz 249 lb 9.6 oz  Weight (kg) 114.851 kg 114.851 kg 113.218 kg     Body mass index is 31.65 kg/m.  General:  Well nourished, well developed, in no acute distress HEENT: normal Neck: no JVD Vascular: No carotid bruits; Distal pulses 2+ bilaterally Cardiac: Irregularly irregular with no murmurs  lungs:  clear to auscultation bilaterally, no wheezing, rhonchi or rales  Abd: soft, nontender, no hepatomegaly  Ext: no edema Musculoskeletal:  No deformities, BUE and BLE strength normal and equal Skin: warm and dry  Neuro:  CNs 2-12 intact, no focal abnormalities noted Psych:  Normal  affect   EKG:  The EKG was personally reviewed and demonstrates: Atrial fibrillation heart rate in the 80s.  IVCD.  Probable LVH.  Nonspecific T wave changes. Telemetry:  Telemetry was personally reviewed and demonstrates: Atrial fibrillation heart rate in the 80s.  Relevant CV Studies: Echocardiogram 10/19/2021 1. Left ventricular ejection fraction, by estimation, is 30 to 35%. The  left ventricle has moderately decreased function. The left ventricle  demonstrates global hypokinesis. The left ventricular internal cavity size  was moderately dilated. There is  moderate concentric left ventricular hypertrophy. Left ventricular  diastolic parameters are consistent with Grade II diastolic dysfunction  (pseudonormalization).   2. Right ventricular systolic function is normal. The right ventricular  size is normal.   3. The mitral valve is normal in structure. Mild mitral valve  regurgitation. No evidence of mitral stenosis.   4. The aortic valve is normal in structure. Aortic valve regurgitation is  mild. Aortic valve sclerosis is present, with no evidence of aortic valve  stenosis.   5. Aneurysm of the ascending aorta, measuring 43 mm.   6. The inferior vena cava is normal in size with greater than 50%  respiratory variability, suggesting right atrial pressure of 3 mmHg.   Comparison(s): No prior Echocardiogram.   Laboratory Data: High Sensitivity Troponin:  No results for input(s): TROPONINIHS in the last 720 hours.   Chemistry Recent Labs  Lab 06/11/24 1346  NA 131*  K 3.4*  CL 99  CO2 20*  GLUCOSE 120*  BUN 39*  CREATININE 2.48*  CALCIUM  9.7  MG 2.4  GFRNONAA 28*  ANIONGAP 12    Recent Labs  Lab 06/11/24 1346  PROT 8.0  ALBUMIN 4.0  AST 46*  ALT 33  ALKPHOS 86  BILITOT 1.8*   Lipids No results for input(s): CHOL, TRIG, HDL, LABVLDL, LDLCALC, CHOLHDL in the last 168 hours.  Hematology Recent Labs  Lab 06/11/24 1346  WBC 12.8*  RBC 6.17*  HGB  18.1*  HCT 54.2*  MCV 87.8  MCH 29.3  MCHC 33.4  RDW 14.8  PLT 220   Thyroid  No results for input(s): TSH, FREET4 in the last 168 hours.  BNPNo results for input(s): BNP, PROBNP in the last 168 hours.  DDimer No results for input(s): DDIMER in the last 168 hours.  Radiology/Studies:  CT HEAD WO CONTRAST Result Date: 06/11/2024 CLINICAL DATA:  Fall, scalp laceration EXAM: CT HEAD WITHOUT CONTRAST TECHNIQUE: Contiguous axial images were obtained from the base of the skull through the vertex without intravenous contrast. RADIATION DOSE REDUCTION: This exam was performed according to the departmental dose-optimization program which includes automated exposure control, adjustment of the mA and/or kV according to patient size and/or use of iterative reconstruction technique. 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 There is no hemorrhage. No acute ischemic changes. No mass lesion. The ventricles are normal. Skull/sinuses/orbits: No significant abnormality. 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  No acute abnormality. Electronically Signed   By: Nancyann Burns M.D.   On: 06/11/2024 15:42   DG Chest Port 1 View Result Date: 06/11/2024 CLINICAL DATA:  Trauma.  Mechanical trip and fall. EXAM: PORTABLE CHEST 1 VIEW COMPARISON:  CT chest 06/05/2024 FINDINGS: Shallow inspiration. Mild cardiac enlargement. No vascular congestion, edema, or consolidation. No pleural effusion or pneumothorax. Mediastinal contours appear intact. IMPRESSION: No active disease. Electronically Signed   By: Elsie Gravely M.D.   On: 06/11/2024 15:38     Assessment and Plan:  Ventricular tachycardia Unclear of duration but reportedly did not lose consciousness although pulse was difficult to palpate.  He was completely asymptomatic.  Shocked x 1 with restoration of atrial fibrillation.  With his HFrEF, ischemic etiologies concerning.  Of note this  happened while he was at the end of his amiodarone  load.  QTc does look long, EKG reads as 569 but he is in atrial fibrillation. Correct electrolytes.  Potassium currently 3.4.  Maintain potassium greater than 4 and magnesium greater than 2.  Will check magnesium. Continue with amiodarone  200 mg daily and carvedilol  12.5 mg.  Defer to EP if to change or add anything else. Will also check a TSH Admits to cocaine use 1 month ago, check UDS. Eventually will need to involve EP, probably after ischemic evaluation to consider ICD. Avoid other QT prolonging medications.  Chronic HFrEF -EF 30 to 35%, global hypokinesis, G2 DD.  Normal RV.  Mild MR. Etiology of cardiomyopathy unknown.  Has been lost in follow-up and has not seen primary cardiologist in 2 years.  Has not had prior ischemic evaluation due to underlying renal disease.  He needs further workup for this as this will help guide decision making for his thoracic aneurysm and possible ICD and A-fib ablation in the future.  Surprisingly with well compensated heart failure.  NYHA class I/II.  Looks euvolemic today.  EF likely worse.  GDMT limited by renal disease.  Continue with carvedilol  12.5 mg twice daily, hydralazine  100 mg 3 times daily, Imdur  60 mg. Okay to hold Farxiga  for now until after cardiac catheterization. Will plan for right and left heart catheterization contingent upon renal function.  N.p.o. tomorrow morning. Obtain echocardiogram  Persistent atrial fibrillation - DCCV 08/2022 with ERAF He is on Amio titration and had tentative plans for repeat cardioversion and potentially ablation in the future after ischemic evaluation.  Rates are controlled around 80.  Also may be contributing to his cardiomyopathy. Continue with Eliquis  5  mg twice daily, amiodarone  200 mg daily, carvedilol  12.5 mg twice daily. Need to confirm whether or not he has missed a dose of Eliquis  as he did mention he did miss some doses of medications this morning.   If so needs TEE. Assess for sleep apnea outpatient  CKD Creatinine slightly up from baseline which is not surprising given the above.  Currently 2.48.  Follow-up with this tomorrow and depending on improvement or not we will decide cardiac catheterization.  Also needs a nephrologist.  Aortic aneurysm Unfortunately he missed his appointment again today with CT surgery.  04/2022 he has 5 cm aneurysm on CT. Needs another CT, eventually will need to see CT surgery.  Cocaine use Reports use 1 month ago.  Encouraged cessation.  Nicotine dependence Smoking about 1 pack/day.  Informed Consent   Shared Decision Making/Informed Consent The risks [stroke (1 in 1000), death (1 in 1000), kidney failure [usually temporary] (1 in 500), bleeding (1 in 200), allergic reaction [possibly serious] (1 in 200)], benefits (diagnostic support and management of coronary artery disease) and alternatives of a cardiac catheterization were discussed in detail with Timothy Moon and he is willing to proceed.      Risk Assessment/Risk Scores:  New York  Heart Association (NYHA) Functional Class NYHA Class II  CHA2DS2-VASc Score = 6   This indicates a 9.7% annual risk of stroke. The patient's score is based upon: CHF History: 1 HTN History: 1 Diabetes History: 0 Stroke History: 2 (TIA) Vascular Disease History: 1 Age Score: 1 Gender Score: 0   For questions or updates, please contact [facility] Please consult [email or web address] for contact info under    Signed, Thom LITTIE Sluder, PA-C  06/11/2024 4:54 PM   I have seen and examined the patient along with Thom LITTIE Sluder, PA-C.  I have reviewed the chart, notes and new data.  I agree with PA/NP's note.  Key new complaints: Suspect that his fall was actually precipitated by hypotension related to sustained ventricular tachycardia rather than a mechanical fall.  He never had full syncope, but required cardioversion by EMS.  He did not have any premonitory  palpitations.  He reports that he has not used cocaine in a month. Key examination changes: He does not have any evidence of overt hypervolemia.  Normal JVD, no edema (he does have varicose veins), irregular rhythm, normal 1st and 2nd heart sounds without murmurs or gallops.  He has a small scalp laceration in the posterior right parietal area. Key new findings / data: He is in atrial fibrillation with controlled ventricular response.  The rhythm strip from EMS shows regular, monomorphic wide-complex tachycardia.  Labs show mild hypokalemia and renal parameters that are slightly worse than his baseline ( usual creatinine around 2.0, currently 2.4) as well as polycythemia (usual hemoglobin around 16, currently 18).  Minimal elevation in high-sensitivity troponin.  BNP is elevated, but only slightly higher than other values obtained over the last couple of years.   PLAN: Sustained ventricular tachycardia in a patient with known severe cardiomyopathy, presumably ischemic cardiomyopathy.  The arrhythmia occurred although he was already receiving a loading dose of amiodarone  for atrial fibrillation.  There is no other indication for an acute coronary syndrome, but he likely has severe underlying coronary insufficiency. He needs to undergo long delayed completion of ischemic workup with coronary angiography, not withstanding his renal insufficiency.  Will plan to do cardiac catheterization tomorrow as long as his creatinine is not worsening. Coronary angiography is necessary to  establish the etiology of his cardiomyopathy, make educated decisions regarding treatment for his atrial and ventricular arrhythmia and as precursor to possible surgical repair of ascending aortic aneurysm. Key potassium more than 4.0 and magnesium more than 2.0.  Recheck labs in the morning. For the time being continue oral amiodarone , switch to IV amiodarone  if his ventricular arrhythmia recurs. Will consult EP regarding secondary  prevention ICD implantation, once his coronary workup is completed.  Informed Consent   Shared Decision Making/Informed Consent The risks [stroke (1 in 1000), death (1 in 1000), kidney failure [usually temporary] (1 in 500), bleeding (1 in 200), allergic reaction [possibly serious] (1 in 200)], benefits (diagnostic support and management of coronary artery disease) and alternatives of a cardiac catheterization were discussed in detail with Timothy Moon and he is willing to proceed.      Jerel Balding, MD, FACC [facility] [phone] 06/11/2024, 5:56 PM

## 2024-06-11 NOTE — ED Provider Notes (Signed)
 [place] EMERGENCY DEPARTMENT AT [facility] Provider Note   CSN: [id number] Arrival date & time: 06/11/24  1331     Patient presents with: Timothy Moon is a 68 y.o. male.    68 y/o male with hx of HTN, Afib (on Eliquis ), HFrEF (LVEF 30-35% in 2023) presents to the ED after a fall. Patient went to a motel to visit friends, but reports getting his shoe caught on a sidewalk causing a fall. Struck head on wooden bannister with wounds to posterior scalp. EMS went to assist patient to stretcher when they report he lost radial pulses and paramedics were unable to get a manual BP. He was found to be in SVT with a rate of 200; subsequently cardioverted in the field with rate improving to the 80's and BP 140 systolic. Patient denies any associated lightheadedness, chest pain, SOB, N/V, syncope, abdominal pain, headache.   Patient Last took Eliquis  last night (did not take any AM medications today). Reports last use of cocaine 1 month ago. Denies ETOH use.   The history is provided by the patient. No language interpreter was used.  Fall       Prior to Admission medications   Medication Sig Start Date End Date Taking? Authorizing Provider  amiodarone  (PACERONE ) 200 MG tablet Take 2 tablets (400 mg total) TWICE a day for 2 weeks, then take 1 tablet (200 mg total) TWICE a day for 2 weeks, then take 1 tablet (200 mg total) ONCE a day 04/24/24 08/20/24  Fenton, Clint R, PA  apixaban  (ELIQUIS ) 5 MG TABS tablet Take 1 tablet (5 mg total) by mouth 2 (two) times daily. 04/24/24   Fenton, Clint R, PA  aspirin  EC 81 MG tablet Take 81-162 mg by mouth every 4 (four) hours as needed for moderate pain (pain score 4-6). Swallow whole.    [provider]  carvedilol  (COREG ) 12.5 MG tablet Take 1 tablet (12.5 mg total) by mouth 2 (two) times daily with a meal. NEEDS FOLLOW UP APPOINTMENT FOR MORE REFILLS 04/24/24   Fenton, Clint R, PA  dapagliflozin  propanediol (FARXIGA ) 10 MG TABS tablet  Take 1 tablet (10 mg total) by mouth daily. 01/08/24   Bensimhon, Toribio SAUNDERS, MD  furosemide  (LASIX ) 40 MG tablet Take 2 tablets (80 mg total) by mouth daily. 05/24/24   Fenton, Clint R, PA  hydrALAZINE  (APRESOLINE ) 100 MG tablet Take 1 tablet (100 mg total) by mouth 3 (three) times daily. Pt must schedule follow up visit with cardiology for further refills 3rd attempt. 05/24/24   Fenton, Clint R, PA  isosorbide  mononitrate (IMDUR ) 60 MG 24 hr tablet Take 1 tablet (60 mg total) by mouth daily. NEEDS FOLLOW UP APPOINTMENT FOR MORE REFILLS 05/24/24   Fenton, Clint R, PA  Multiple Vitamin (MULTIVITAMIN) capsule Take 1 capsule by mouth as needed. Centrum Silver for men 10/02/23   Lucius Krabbe, NP  nitroGLYCERIN  (NITROSTAT ) 0.4 MG SL tablet Place 1 tablet (0.4 mg total) under the tongue every 5 (five) minutes as needed for chest pain. 10/21/21   Leotis Bogus, MD  potassium chloride  SA (KLOR-CON  M) 20 MEQ tablet Take 1 tablet (20 mEq total) by mouth daily. NEEDS FOLLOW UP APPOINTMENT FOR MORE REFILLS 01/08/24   Bensimhon, Toribio SAUNDERS, MD    Allergies: Patient has no known allergies.    Review of Systems Ten systems reviewed and are negative for acute change, except as noted in the HPI.    Updated Vital Signs BP ROLLEN)  129/90   Pulse 80   Temp 97.9 F (36.6 C) (Oral)   Resp 20   Ht 6' 3 (1.905 m)   Wt 114.9 kg   SpO2 94%   BMI 31.65 kg/m   Physical Exam Vitals and nursing note reviewed.  Constitutional:      General: He is not in acute distress.    Appearance: He is well-developed. He is not diaphoretic.     Comments: Alert and nontoxic appearing, in NAD  HENT:     Head: Normocephalic and atraumatic.     Comments: Abrasions to right posterior parietal scalp. No deep lacerations. Bleeding controlled. Eyes:     General: No scleral icterus.    Conjunctiva/sclera: Conjunctivae normal.  Cardiovascular:     Rate and Rhythm: Normal rate. Rhythm irregular.     Pulses: Normal pulses.     Comments:  Rate controlled Afib. Pulmonary:     Effort: Pulmonary effort is normal. No respiratory distress.     Comments: Respirations even and unlabored Musculoskeletal:        General: Normal range of motion.     Cervical back: Normal range of motion.     Comments: Varicosities BLE  Skin:    General: Skin is warm and dry.     Coloration: Skin is not pale.     Findings: No erythema or rash.  Neurological:     Mental Status: He is alert and oriented to person, place, and time.     Coordination: Coordination normal.  Psychiatric:        Behavior: Behavior normal.     (all labs ordered are listed, but only abnormal results are displayed) Labs Reviewed  CBC WITH DIFFERENTIAL/PLATELET - Abnormal; Notable for the following components:      Result Value   WBC 12.8 (*)    RBC 6.17 (*)    Hemoglobin 18.1 (*)    HCT 54.2 (*)    Neutro Abs 10.3 (*)    All other components within normal limits  COMPREHENSIVE METABOLIC PANEL WITH GFR - Abnormal; Notable for the following components:   Sodium 131 (*)    Potassium 3.4 (*)    CO2 20 (*)    Glucose, Bld 120 (*)    BUN 39 (*)    Creatinine, Ser 2.48 (*)    AST 46 (*)    Total Bilirubin 1.8 (*)    GFR, Estimated 28 (*)    All other components within normal limits  PROTIME-INR - Abnormal; Notable for the following components:   Prothrombin Time 16.5 (*)    INR 1.3 (*)    All other components within normal limits  MAGNESIUM  RAPID URINE DRUG SCREEN, HOSP PERFORMED  SAMPLE TO BLOOD BANK    EKG: EKG Interpretation Date/Time:  Tuesday June 11 2024 13:41:42 EDT Ventricular Rate:  86 PR Interval:    QRS Duration:  126 QT Interval:  475 QTC Calculation: 569 R Axis:   -86  Text Interpretation: Atrial fibrillation Nonspecific IVCD with LAD LVH with secondary repolarization abnormality Anterior Q waves, possibly due to LVH Confirmed by Simon Rea [phone]) on 06/11/2024 3:15:58 PM  Radiology: CT HEAD WO CONTRAST Result Date:  06/11/2024 CLINICAL DATA:  Fall, scalp laceration EXAM: CT HEAD WITHOUT CONTRAST TECHNIQUE: Contiguous axial images were obtained from the base of the skull through the vertex without intravenous contrast. RADIATION DOSE REDUCTION: This exam was performed according to the departmental dose-optimization program which includes automated exposure control, adjustment of the mA and/or kV  according to patient size and/or use of iterative reconstruction technique. 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 There is no hemorrhage. No acute ischemic changes. No mass lesion. The ventricles are normal. Skull/sinuses/orbits: No significant abnormality. 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  No acute abnormality. Electronically Signed   By: Nancyann Burns M.D.   On: 06/11/2024 15:42   DG Chest Port 1 View Result Date: 06/11/2024 CLINICAL DATA:  Trauma.  Mechanical trip and fall. EXAM: PORTABLE CHEST 1 VIEW COMPARISON:  CT chest 06/05/2024 FINDINGS: Shallow inspiration. Mild cardiac enlargement. No vascular congestion, edema, or consolidation. No pleural effusion or pneumothorax. Mediastinal contours appear intact. IMPRESSION: No active disease. Electronically Signed   By: Elsie Gravely M.D.   On: 06/11/2024 15:38     Procedures   Medications Ordered in the ED - No data to display  Clinical Course as of 06/11/24 1617  Tue Jun 11, 2024  1541 CXR negative. [KH]  1549 Spoke with Trish from Cardiology. Their service will plan to assess in consultation. [KH]  1549 Head CT negative for acute pathology. No ICH.  [KH]  1616 Rhythm strip obtained from nursing c/w VT and NOT SVT. [KH]    Clinical Course User Index [KH] Keith Sor, PA-C                                 Medical Decision Making Amount and/or Complexity of Data Reviewed Labs: ordered. Radiology: ordered.   This patient presents to the ED for concern of fall, this involves an  extensive number of treatment options, and is a complaint that carries with it a high risk of complications and morbidity.  The differential diagnosis includes mechanical fall vs ICH vs other fx vs hypotension vs hypoglycemia vs substance use/abuse/intoxication   Co morbidities that complicate the patient evaluation  HTN Afib HFrEF   Additional history obtained:  Additional history obtained from EMS personnel External records from outside source obtained and reviewed including CT chest from 06/05/24 which noted similar aneurysm of 4.9cm.   Lab Tests:  I Ordered, and personally interpreted labs.  The pertinent results include:  WBC 12.8 (possibly 2/2 cardioversion), Hgb 18.1, BUN 39, Creatinine 2.48   Imaging Studies ordered:  I ordered imaging studies including CXR  I independently visualized and interpreted imaging which showed stable mediastinal contours; no acute cardiopulmonary abnormality. I agree with the radiologist interpretation I ordered imaging studies including CT head  I independently visualized and interpreted imaging which showed no ICH or skull fracture. I agree with the radiologist interpretation   Cardiac Monitoring:  The patient was maintained on a cardiac monitor.  I personally viewed and interpreted the cardiac monitored which showed an underlying rhythm of: rate controlled Afib.   Medicines ordered and prescription drug management:  I ordered medication including Amiodarone , Coreg , Eliquis  for maintenance of daily prescriptions.  Reevaluation of the patient after these medicines showed that the patient stayed the same I have reviewed the patients home medicines and have made adjustments as needed   Test Considered:  Troponin    Critical Interventions:  Monitoring on Telemetry   Consultations Obtained:  I requested consultation with Trish of Cardiology and discussed lab and imaging findings as well as pertinent plan - they will assess the  patient in consultation Communication with Darryle RIGGERS of Cardiology who has plans to assess patient at  bedside. Spoke with Dr. Claudene of [facility] who will also assess patient for admission.   Problem List / ED Course:  As above 68 year old male presenting by EMS after mechanical fall, striking the back of his head.  Sustained an abrasion to his right posterior parietal scalp.  No laceration necessitating sutures.  No focal neurologic deficits on examination.  Patient awake and alert, answers questions appropriately.  Patient is chronically anticoagulated on Eliquis .  Head CT shows no concern for skull fracture or intracranial hemorrhage.  Did miss his morning dose of Eliquis .  This was resumed following reassuring head imaging. EMS reported cardioversion in the field for SVT; however, rhythm strips were later obtained which shows an episode of V. tach.  This is more consistent with reports of loss of distal pulses in the field with inability to obtain manual blood pressure.  He was cardioverted prior to arrival and has remained in rate controlled atrial fibrillation. Has a history of cocaine use and reports last use was 1 month ago, though UDS is pending to evaluate potential for more recent use - this certainly could have contributed to his episode PTA. Hx of HFrEF, with last echo in 2023. At that time EF was 30-35%. Known thoracic aneurysm but without c/o chest pain. Denies SOB, lightheadedness, DOE. Mediastinal contours on Xray appear reassuring. Cardiology seeing patient in consultation. Current plan is for admission to hospitalist service. Dr. Claudene aware. Amiodarone  and Coreg  ordered to adhere to prescribed medications; however RN given order to hold pending cardiology consultation and recommendations. Most recent BP 105/83.   Reevaluation:  After the interventions noted above, I reevaluated the patient and found that they have :remained stable   Social Determinants of Health:  Lives  independently   Dispostion:  After consideration of the diagnostic results and the patients response to treatment, I feel that the patent would benefit from admission for further evaluation and management, monitoring on telemetry.       Final diagnoses:  Tachyarrhythmia  AKI (acute kidney injury) [facility])  Injury of head, initial encounter    ED Discharge Orders     None          Keith Sor, PA-C 06/11/24 1630    Simon Lavonia SAILOR, MD 06/12/24 [phone]

## 2024-06-11 NOTE — ED Notes (Signed)
 XR at bedside

## 2024-06-11 NOTE — ED Notes (Signed)
 Per Burnard PA, hold carvedilol  and amio pending cards consult

## 2024-06-12 ENCOUNTER — Encounter (HOSPITAL_COMMUNITY)
Admission: EM | Disposition: A | Discharge status: Home / Self Care | Payer: Self-pay | Source: Home / Self Care | Attending: Internal Medicine

## 2024-06-12 ENCOUNTER — Inpatient Hospital Stay (HOSPITAL_COMMUNITY)

## 2024-06-12 DIAGNOSIS — E66811 Obesity, class 1: Secondary | ICD-10-CM | POA: Diagnosis present

## 2024-06-12 DIAGNOSIS — S0990XA Unspecified injury of head, initial encounter: Secondary | ICD-10-CM | POA: Diagnosis not present

## 2024-06-12 DIAGNOSIS — N1832 Chronic kidney disease, stage 3b: Secondary | ICD-10-CM | POA: Diagnosis present

## 2024-06-12 DIAGNOSIS — R9431 Abnormal electrocardiogram [ECG] [EKG]: Secondary | ICD-10-CM

## 2024-06-12 DIAGNOSIS — I472 Ventricular tachycardia, unspecified: Secondary | ICD-10-CM | POA: Diagnosis present

## 2024-06-12 DIAGNOSIS — S0101XA Laceration without foreign body of scalp, initial encounter: Secondary | ICD-10-CM | POA: Diagnosis present

## 2024-06-12 DIAGNOSIS — I214 Non-ST elevation (NSTEMI) myocardial infarction: Secondary | ICD-10-CM | POA: Diagnosis not present

## 2024-06-12 DIAGNOSIS — W010XXA Fall on same level from slipping, tripping and stumbling without subsequent striking against object, initial encounter: Secondary | ICD-10-CM | POA: Diagnosis present

## 2024-06-12 DIAGNOSIS — F17210 Nicotine dependence, cigarettes, uncomplicated: Secondary | ICD-10-CM | POA: Diagnosis present

## 2024-06-12 DIAGNOSIS — D72829 Elevated white blood cell count, unspecified: Secondary | ICD-10-CM | POA: Diagnosis present

## 2024-06-12 DIAGNOSIS — I5022 Chronic systolic (congestive) heart failure: Secondary | ICD-10-CM | POA: Diagnosis present

## 2024-06-12 DIAGNOSIS — I4729 Other ventricular tachycardia: Secondary | ICD-10-CM | POA: Diagnosis present

## 2024-06-12 DIAGNOSIS — I255 Ischemic cardiomyopathy: Secondary | ICD-10-CM | POA: Diagnosis present

## 2024-06-12 DIAGNOSIS — D751 Secondary polycythemia: Secondary | ICD-10-CM | POA: Diagnosis present

## 2024-06-12 DIAGNOSIS — E871 Hypo-osmolality and hyponatremia: Secondary | ICD-10-CM | POA: Diagnosis present

## 2024-06-12 DIAGNOSIS — I771 Stricture of artery: Secondary | ICD-10-CM | POA: Diagnosis present

## 2024-06-12 DIAGNOSIS — I4819 Other persistent atrial fibrillation: Secondary | ICD-10-CM | POA: Diagnosis present

## 2024-06-12 DIAGNOSIS — N179 Acute kidney failure, unspecified: Secondary | ICD-10-CM | POA: Diagnosis not present

## 2024-06-12 DIAGNOSIS — I2510 Atherosclerotic heart disease of native coronary artery without angina pectoris: Secondary | ICD-10-CM | POA: Diagnosis not present

## 2024-06-12 DIAGNOSIS — Y92480 Sidewalk as the place of occurrence of the external cause: Secondary | ICD-10-CM | POA: Diagnosis not present

## 2024-06-12 DIAGNOSIS — I272 Pulmonary hypertension, unspecified: Secondary | ICD-10-CM | POA: Diagnosis present

## 2024-06-12 DIAGNOSIS — E876 Hypokalemia: Secondary | ICD-10-CM | POA: Diagnosis present

## 2024-06-12 DIAGNOSIS — I471 Supraventricular tachycardia, unspecified: Secondary | ICD-10-CM | POA: Diagnosis present

## 2024-06-12 DIAGNOSIS — E861 Hypovolemia: Secondary | ICD-10-CM | POA: Diagnosis present

## 2024-06-12 DIAGNOSIS — R55 Syncope and collapse: Secondary | ICD-10-CM | POA: Diagnosis present

## 2024-06-12 DIAGNOSIS — F1410 Cocaine abuse, uncomplicated: Secondary | ICD-10-CM | POA: Diagnosis present

## 2024-06-12 DIAGNOSIS — I428 Other cardiomyopathies: Secondary | ICD-10-CM | POA: Diagnosis present

## 2024-06-12 DIAGNOSIS — I7121 Aneurysm of the ascending aorta, without rupture: Secondary | ICD-10-CM | POA: Diagnosis present

## 2024-06-12 DIAGNOSIS — I130 Hypertensive heart and chronic kidney disease with heart failure and stage 1 through stage 4 chronic kidney disease, or unspecified chronic kidney disease: Secondary | ICD-10-CM | POA: Diagnosis present

## 2024-06-12 HISTORY — DX: Ventricular tachycardia, unspecified: I47.20

## 2024-06-12 HISTORY — PX: RIGHT/LEFT HEART CATH AND CORONARY ANGIOGRAPHY: CATH118266

## 2024-06-12 LAB — POCT I-STAT 7, (LYTES, BLD GAS, ICA,H+H)
Acid-base deficit: 2 mmol/L (ref 0.0–2.0)
Acid-base deficit: 4 mmol/L — ABNORMAL HIGH (ref 0.0–2.0)
Bicarbonate: 21.8 mmol/L (ref 20.0–28.0)
Bicarbonate: 24.4 mmol/L (ref 20.0–28.0)
Calcium, Ion: 1.29 mmol/L (ref 1.15–1.40)
Calcium, Ion: 1.31 mmol/L (ref 1.15–1.40)
HCT: 48 % (ref 39.0–52.0)
HCT: 48 % (ref 39.0–52.0)
Hemoglobin: 16.3 g/dL (ref 13.0–17.0)
Hemoglobin: 16.3 g/dL (ref 13.0–17.0)
O2 Saturation: 66 %
O2 Saturation: 93 %
Potassium: 3.6 mmol/L (ref 3.5–5.1)
Potassium: 3.6 mmol/L (ref 3.5–5.1)
Sodium: 143 mmol/L (ref 135–145)
Sodium: 143 mmol/L (ref 135–145)
TCO2: 23 mmol/L (ref 22–32)
TCO2: 26 mmol/L (ref 22–32)
pCO2 arterial: 39.4 mmHg (ref 32–48)
pCO2 arterial: 45.3 mmHg (ref 32–48)
pH, Arterial: 7.339 — ABNORMAL LOW (ref 7.35–7.45)
pH, Arterial: 7.351 (ref 7.35–7.45)
pO2, Arterial: 36 mmHg — CL (ref 83–108)
pO2, Arterial: 71 mmHg — ABNORMAL LOW (ref 83–108)

## 2024-06-12 LAB — BASIC METABOLIC PANEL WITH GFR
Anion gap: 10 (ref 5–15)
BUN: 36 mg/dL — ABNORMAL HIGH (ref 8–23)
CO2: 26 mmol/L (ref 22–32)
Calcium: 9.8 mg/dL (ref 8.9–10.3)
Chloride: 103 mmol/L (ref 98–111)
Creatinine, Ser: 2.22 mg/dL — ABNORMAL HIGH (ref 0.61–1.24)
GFR, Estimated: 32 mL/min — ABNORMAL LOW (ref >60)
Glucose, Bld: 90 mg/dL (ref 70–99)
Potassium: 4.6 mmol/L (ref 3.5–5.1)
Sodium: 139 mmol/L (ref 135–145)

## 2024-06-12 LAB — LIPID PANEL
Cholesterol: 150 mg/dL (ref 0–200)
HDL: 44 mg/dL (ref >40)
LDL Cholesterol: 99 mg/dL (ref 0–99)
Total CHOL/HDL Ratio: 3.4 ratio
Triglycerides: 37 mg/dL (ref <150)
VLDL: 7 mg/dL (ref 0–40)

## 2024-06-12 LAB — CBC
HCT: 52.7 % — ABNORMAL HIGH (ref 39.0–52.0)
Hemoglobin: 17.3 g/dL — ABNORMAL HIGH (ref 13.0–17.0)
MCH: 29 pg (ref 26.0–34.0)
MCHC: 32.8 g/dL (ref 30.0–36.0)
MCV: 88.3 fL (ref 80.0–100.0)
Platelets: 195 K/uL (ref 150–400)
RBC: 5.97 MIL/uL — ABNORMAL HIGH (ref 4.22–5.81)
RDW: 15 % (ref 11.5–15.5)
WBC: 10.5 K/uL (ref 4.0–10.5)
nRBC: 0 % (ref 0.0–0.2)

## 2024-06-12 LAB — ECHOCARDIOGRAM COMPLETE
Calc EF: 39.2 %
Height: 75 in
Single Plane A2C EF: 43.2 %
Single Plane A4C EF: 37 %
Weight: 4028.8 [oz_av]

## 2024-06-12 LAB — MAGNESIUM: Magnesium: 2.5 mg/dL — ABNORMAL HIGH (ref 1.7–2.4)

## 2024-06-12 LAB — TSH: TSH: 1.036 u[IU]/mL (ref 0.350–4.500)

## 2024-06-12 LAB — APTT
aPTT: 54 s — ABNORMAL HIGH (ref 24–36)
aPTT: 31 s (ref 24–36)

## 2024-06-12 LAB — HEPARIN LEVEL (UNFRACTIONATED): Heparin Unfractionated: 0.83 [IU]/mL — ABNORMAL HIGH (ref 0.30–0.70)

## 2024-06-12 SURGERY — RIGHT/LEFT HEART CATH AND CORONARY ANGIOGRAPHY
Anesthesia: LOCAL

## 2024-06-12 MED ORDER — SODIUM CHLORIDE 0.9 % IV SOLN
250.0000 mL | INTRAVENOUS | Status: AC | PRN
Start: 2024-06-12 — End: 2024-06-13

## 2024-06-12 MED ORDER — VERAPAMIL HCL 2.5 MG/ML IV SOLN
INTRAVENOUS | Status: AC
Start: 2024-06-12 — End: 2024-06-12
  Filled 2024-06-12: qty 2

## 2024-06-12 MED ORDER — MIDAZOLAM HCL 2 MG/2ML IJ SOLN
INTRAMUSCULAR | Status: AC
Start: 2024-06-12 — End: 2024-06-12
  Filled 2024-06-12: qty 2

## 2024-06-12 MED ORDER — PERFLUTREN LIPID MICROSPHERE
1.0000 mL | INTRAVENOUS | Status: AC | PRN
  Administered 2024-06-12: 3 mL via INTRAVENOUS

## 2024-06-12 MED ORDER — SODIUM CHLORIDE 0.9% FLUSH: 3.0000 mL | INTRAVENOUS | Status: DC | PRN

## 2024-06-12 MED ORDER — LIDOCAINE HCL (PF) 1 % IJ SOLN
INTRAMUSCULAR | Status: DC | PRN
  Administered 2024-06-12 (×2): 2 mL via INTRADERMAL

## 2024-06-12 MED ORDER — FREE WATER
500.0000 mL | Freq: Once | Status: AC
  Administered 2024-06-12: 500 mL via ORAL

## 2024-06-12 MED ORDER — HEPARIN (PORCINE) IN NACL 1000-0.9 UT/500ML-% IV SOLN
INTRAVENOUS | Status: DC | PRN
Start: 2024-06-12 — End: 2024-06-12
  Administered 2024-06-12 (×2): 500 mL

## 2024-06-12 MED ORDER — HEPARIN SODIUM (PORCINE) 1000 UNIT/ML IJ SOLN
INTRAMUSCULAR | Status: DC | PRN
Start: 2024-06-12 — End: 2024-06-12
  Administered 2024-06-12: 5000 [IU] via INTRAVENOUS

## 2024-06-12 MED ORDER — VERAPAMIL HCL 2.5 MG/ML IV SOLN
INTRAVENOUS | Status: DC | PRN
  Administered 2024-06-12: 10 mL via INTRA_ARTERIAL

## 2024-06-12 MED ORDER — MIDAZOLAM HCL 2 MG/2ML IJ SOLN
INTRAMUSCULAR | Status: DC | PRN
Start: 2024-06-12 — End: 2024-06-12
  Administered 2024-06-12: 1 mg via INTRAVENOUS

## 2024-06-12 MED ORDER — FENTANYL CITRATE (PF) 100 MCG/2ML IJ SOLN
INTRAMUSCULAR | Status: AC
  Filled 2024-06-12: qty 2

## 2024-06-12 MED ORDER — HEPARIN (PORCINE) 25000 UT/250ML-% IV SOLN
1400.0000 [IU]/h | INTRAVENOUS | Status: DC
  Administered 2024-06-12: 1400 [IU]/h via INTRAVENOUS

## 2024-06-12 MED ORDER — FENTANYL CITRATE (PF) 100 MCG/2ML IJ SOLN
INTRAMUSCULAR | Status: DC | PRN
  Administered 2024-06-12: 25 ug via INTRAVENOUS

## 2024-06-12 MED ORDER — LIDOCAINE HCL (PF) 1 % IJ SOLN
INTRAMUSCULAR | Status: AC
  Filled 2024-06-12: qty 30

## 2024-06-12 MED ORDER — HEPARIN SODIUM (PORCINE) 1000 UNIT/ML IJ SOLN
INTRAMUSCULAR | Status: AC
  Filled 2024-06-12: qty 10

## 2024-06-12 MED ORDER — IOHEXOL 350 MG/ML SOLN
INTRAVENOUS | Status: DC | PRN
  Administered 2024-06-12: 85 mL

## 2024-06-12 MED ORDER — SODIUM CHLORIDE 0.9% FLUSH
3.0000 mL | Freq: Two times a day (BID) | INTRAVENOUS | Status: DC
  Administered 2024-06-12 – 2024-06-14 (×5): 3 mL via INTRAVENOUS

## 2024-06-12 SURGICAL SUPPLY — 14 items
CATH BALLN WEDGE 5F 110CM (CATHETERS) IMPLANT
CATH INFINITI 5 FR 3DRC (CATHETERS) IMPLANT
CATH INFINITI 5 FR AR1 MOD (CATHETERS) IMPLANT
CATH INFINITI 5FR MULTPACK ANG (CATHETERS) IMPLANT
CATH INFINITI AMBI 5FR JK (CATHETERS) IMPLANT
CATH LAUNCHER 6FR AL.75 (CATHETERS) IMPLANT
DEVICE RAD COMP TR BAND LRG (VASCULAR PRODUCTS) IMPLANT
GLIDESHEATH SLEND SS 6F .021 (SHEATH) IMPLANT
GUIDEWIRE INQWIRE 1.5J.035X260 (WIRE) IMPLANT
PACK CARDIAC CATHETERIZATION (CUSTOM PROCEDURE TRAY) ×1 IMPLANT
SET ATX-X65L (MISCELLANEOUS) IMPLANT
SHEATH GLIDE SLENDER 4/5FR (SHEATH) IMPLANT
SHEATH PROBE COVER 6X72 (BAG) IMPLANT
WIRE MICROINTRODUCER 60CM (WIRE) IMPLANT

## 2024-06-12 NOTE — Progress Notes (Signed)
 Progress Note  Patient Name: Timothy Moon Date of Encounter: 06/12/2024 [facility] Cardiologist: Timothy Shallow, MD   Interval Summary   No ventricular arrhythmia overnight.  Remains in atrial fibrillation with slow or controlled ventricular rates.  Denies angina or dyspnea.  Preliminary review of echo images shows a dilated left ventricle with unchanged LVEF around 30% and global hypokinesis.  Seems to have dyssynchronous septal activation from intraventricular conduction delay (incomplete left bundle branch block), but there also seems to be disproportionately severe hypokinesis of the basal-mid inferior wall and inferior septum suggesting coronary disease.  Also noted is known aneurysm of the aortic root at about 5 cm diameter.  There is only mild aortic insufficiency.  Renal parameters show improvement with creatinine now 2.2 which is very close to his baseline around 2.0.  High-sensitivity troponin I with a fairly flat profile elevated at 2000.  Vital Signs Vitals:   06/12/24 0239 06/12/24 0300 06/12/24 0800 06/12/24 0844  BP:  (!) 140/97 (!) 142/94 (!) 142/94  Pulse:  87 60 65  Resp:  14 14   Temp:  98.1 F (36.7 C) 97.6 F (36.4 C)   TempSrc:  Oral Oral   SpO2:  92% 97%   Weight: 114.2 kg     Height:        Intake/Output Summary (Last 24 hours) at 06/12/2024 1132 Last data filed at 06/12/2024 0849 Gross per 24 hour  Intake 1238.79 ml  Output 600 ml  Net 638.79 ml      06/12/2024    2:39 AM 06/11/2024    1:37 PM 05/24/2024    3:23 PM  Last 3 Weights  Weight (lbs) 251 lb 12.8 oz 253 lb 3.2 oz 253 lb 3.2 oz  Weight (kg) 114.216 kg 114.851 kg 114.851 kg      Telemetry/ECG  Atrial fibrillation with slow ventricular response- Personally Reviewed  Physical Exam  GEN: No acute distress.   Neck: No JVD Cardiac: Irregular rhythm , no murmurs, rubs, or gallops.  Respiratory: Clear to auscultation bilaterally. GI: Soft, nontender, non-distended  MS: No  edema.  Small to professional laceration on left posterior parietal scalp.  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  Plan today for right and left heart catheterization to clarify etiology of his cardiomyopathy.  If he has nonischemic cardiomyopathy he should undergo secondary prevention implantation of a cardiac defibrillator, while we also optimize guideline directed medical therapy.  Since there is a plan for repeat cardioversion and may be ablation for atrial fibrillation he should receive a dual-chamber device.  If he has, as is more probable, ischemic cardiomyopathy with multivessel coronary artery disease he will require bypass surgery and ascending aortic aneurysm repair.  He expresses a lot of reluctance to undergo such a big and risky surgery, since he is primarily interested in quality of life rather than length of the life.  Will have to review his options after we get all the necessary information.  There is also possibility that I will find critical single-vessel CAD as a cause for his arrhythmia (although monomorphic VT most like indicates scar rather than acute ischemia) and that he will then  receive PCI-stent.  Under those circumstances we will need to discuss whether he should receive a LifeVest for 90 days before reassessing for ICD.  Informed Consent   Shared Decision Making/Informed Consent The risks [stroke (1 in 1000), death (1 in 1000), kidney failure [usually temporary] (1 in 500), bleeding (1 in 200), allergic reaction [possibly serious] (1 in 200)],  benefits (diagnostic support and management of coronary artery disease) and alternatives of a cardiac catheterization were discussed in detail with Timothy Moon and he is willing to proceed.        For questions or updates, please contact  [facility] Please consult [email or web address] for contact info under       Signed, Timothy Balding, MD

## 2024-06-12 NOTE — Progress Notes (Signed)
 Patiet refused to watch cath/PCI video

## 2024-06-12 NOTE — H&P (View-Only) (Signed)
 Progress Note  Patient Name: Timothy Moon Date of Encounter: 06/12/2024 [facility] Cardiologist: Redell Shallow, MD   Interval Summary   No ventricular arrhythmia overnight.  Remains in atrial fibrillation with slow or controlled ventricular rates.  Denies angina or dyspnea.  Preliminary review of echo images shows a dilated left ventricle with unchanged LVEF around 30% and global hypokinesis.  Seems to have dyssynchronous septal activation from intraventricular conduction delay (incomplete left bundle branch block), but there also seems to be disproportionately severe hypokinesis of the basal-mid inferior wall and inferior septum suggesting coronary disease.  Also noted is known aneurysm of the aortic root at about 5 cm diameter.  There is only mild aortic insufficiency.  Renal parameters show improvement with creatinine now 2.2 which is very close to his baseline around 2.0.  High-sensitivity troponin I with a fairly flat profile elevated at 2000.  Vital Signs Vitals:   06/12/24 0239 06/12/24 0300 06/12/24 0800 06/12/24 0844  BP:  (!) 140/97 (!) 142/94 (!) 142/94  Pulse:  87 60 65  Resp:  14 14   Temp:  98.1 F (36.7 C) 97.6 F (36.4 C)   TempSrc:  Oral Oral   SpO2:  92% 97%   Weight: 114.2 kg     Height:        Intake/Output Summary (Last 24 hours) at 06/12/2024 1132 Last data filed at 06/12/2024 0849 Gross per 24 hour  Intake 1238.79 ml  Output 600 ml  Net 638.79 ml      06/12/2024    2:39 AM 06/11/2024    1:37 PM 05/24/2024    3:23 PM  Last 3 Weights  Weight (lbs) 251 lb 12.8 oz 253 lb 3.2 oz 253 lb 3.2 oz  Weight (kg) 114.216 kg 114.851 kg 114.851 kg      Telemetry/ECG  Atrial fibrillation with slow ventricular response- Personally Reviewed  Physical Exam  GEN: No acute distress.   Neck: No JVD Cardiac: Irregular rhythm , no murmurs, rubs, or gallops.  Respiratory: Clear to auscultation bilaterally. GI: Soft, nontender, non-distended  MS: No  edema.  Small to professional laceration on left posterior parietal scalp.  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  Plan today for right and left heart catheterization to clarify etiology of his cardiomyopathy.  If he has nonischemic cardiomyopathy he should undergo secondary prevention implantation of a cardiac defibrillator, while we also optimize guideline directed medical therapy.  Since there is a plan for repeat cardioversion and may be ablation for atrial fibrillation he should receive a dual-chamber device.  If he has, as is more probable, ischemic cardiomyopathy with multivessel coronary artery disease he will require bypass surgery and ascending aortic aneurysm repair.  He expresses a lot of reluctance to undergo such a big and risky surgery, since he is primarily interested in quality of life rather than length of the life.  Will have to review his options after we get all the necessary information.  There is also possibility that I will find critical single-vessel CAD as a cause for his arrhythmia (although monomorphic VT most like indicates scar rather than acute ischemia) and that he will then  receive PCI-stent.  Under those circumstances we will need to discuss whether he should receive a LifeVest for 90 days before reassessing for ICD.  Informed Consent   Shared Decision Making/Informed Consent The risks [stroke (1 in 1000), death (1 in 1000), kidney failure [usually temporary] (1 in 500), bleeding (1 in 200), allergic reaction [possibly serious] (1 in 200)],  benefits (diagnostic support and management of coronary artery disease) and alternatives of a cardiac catheterization were discussed in detail with [name] and he is willing to proceed.        For questions or updates, please contact  [facility] Please consult [email or web address] for contact info under       Signed, Jerel Balding, MD

## 2024-06-12 NOTE — Interval H&P Note (Signed)
 History and Physical Interval Note:  06/12/2024 12:36 PM  Timothy Moon  has presented today for surgery, with the diagnosis of heart failure.  The various methods of treatment have been discussed with the patient and family. After consideration of risks, benefits and other options for treatment, the patient has consented to  Procedure(s): RIGHT/LEFT HEART CATH AND CORONARY ANGIOGRAPHY (N/A) as a surgical intervention.  The patient's history has been reviewed, patient examined, no change in status, stable for surgery.  I have reviewed the patient's chart and labs.  Questions were answered to the patient's satisfaction.     [name]

## 2024-06-12 NOTE — Discharge Instructions (Signed)
 Mental Health Resources -[facility] ([address], [place], [place] [address]) [phone]  Psychiatrists  -[facility] ([address], [place], [place] [address]) [phone] -[facility] ([address], [place], [place] [address]) [phone] -Ima Anon, MD ([address] [place], [place] [address]) [phone] [facility] ([address] [place], [place] [address]) [phone]  Therapists -[facility] ([address], [place], [place] [phone] -[facility] ([address], [place], [place] [address]) [phone] -[facility] ([address] [place], [address], [place], [place] [address]) [phone] [facility] HOLLIS FORBES Joylene [place] [place], [place] [address]) [phone] -[facility] ([address] [place], [place], [place] [address]) [phone] [facility] ([address] [place], [place], [place] [phone] -[facility] ([address] [place], [place], [place]) [phone] -[facility] ([address], [place], [place] [address]) [phone] -[facility] ([address] [place], [place], [place] [address]) [phone] -[address] [place], [place] [address]) [phone]

## 2024-06-12 NOTE — Progress Notes (Signed)
 TR band removed per MD order without difficulty.  Gauze and tegaderm placed and patient educated on limited use of his right arm.

## 2024-06-12 NOTE — Progress Notes (Signed)
 [facility] PROGRESS NOTE    Progress Note  Timothy Moon  FMW:[id number] DOB: [birth year] DOA: 06/11/2024 PCP: Lucius Krabbe, NP     Brief Narrative:   Timothy Moon is an 68 y.o. male past medical history significant for hypertension HFrEF EF of 30%, with a large aortic aneurysm 5 cm by CT on July 2023, he also missed his CT surgery appointment has not had a left heart cath due to renal disease, chronic atrial fibrillation on Eliquis  who had failed DCCV on 11/23 and this is was the last time he saw his cardiologist until 05/24/2024, tobacco abuse chronic kidney disease stage IIIa cocaine use comes in after fall, according to EMS was found in VT's in the 200s, which required cardioversion by EMS in the ED UDS was positive for cocaine, potassium 3.4 creatinine of 2.4, white blood cell count of 13 hemoglobin of 18    Assessment/Plan:   Sustained Ventricular tachycardia (HCC)/chronic atrial fibrillation In a patient with severe cardiomyopathy, the arrhythmia occurred despite receiving a load of amiodarone  . Cardiology was consulted recommended ischemic evaluation with a coronary angiogram on 06/12/2024. They also recommended to continue amiodarone  IV, Coreg  and EP was consulted for possible AICD. Try to keep magnesium greater than 2 potassium greater than 4. Hold Eliquis  continue IV heparin .  Fall: Likely due to VT's.  Leukocytosis: Likely reactive.  Acute kidney injury superimposed on chronic kidney disease stage IIIb: Baseline creatinine is around 1.8-2. Will start on gentle IV fluid hydration basic metabolic panel is pending this morning.  Erythrocytosis: Likely due to hemoconcentration. Being hydrated CBC is pending this morning.  Hypovolemic hyponatremia call Start on IV fluids basic metabolic panels pending this morning.  Hypokalemia/hypomagnesemia: Potassium repleted orally, magnesium is pending this morning.  HFrEF with an EF of 30% Last 2D echo in  2023 showed an EF of 20 to 30%. Continue strict I's and O's and daily weights. Basic metabolic panel is pending.  Essential hypertension: Continue current regimen.  Cocaine abuse: He has been counseled.  Obesity (BMI 30-39.9) noted  DVT prophylaxis: lovenox  Family Communication:none Status is: Observation The patient will require care spanning > 2 midnights and should be moved to inpatient because: VT's    Code Status:     Code Status Orders  (From admission, onward)           Start     Ordered   06/11/24 1621  Full code  Continuous       Question:  By:  Answer:  Consent: discussion documented in EHR   06/11/24 1626           Code Status History     Date Active Date Inactive Code Status Order ID Comments User Context   10/19/2021 0509 10/21/2021 1642 Full Code [id number]  Kenard Zachary PARAS, MD ED         IV Access:   Peripheral IV   Procedures and diagnostic studies:   CT HEAD WO CONTRAST Result Date: 06/11/2024 CLINICAL DATA:  Fall, scalp laceration EXAM: CT HEAD WITHOUT CONTRAST TECHNIQUE: Contiguous axial images were obtained from the base of the skull through the vertex without intravenous contrast. RADIATION DOSE REDUCTION: This exam was performed according to the departmental dose-optimization program which includes automated exposure control, adjustment of the mA and/or kV according to patient size and/or use of iterative reconstruction technique. 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 There is no hemorrhage. No acute ischemic changes.  No mass lesion. The ventricles are normal. Skull/sinuses/orbits: No significant abnormality. 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  No acute abnormality. Electronically Signed   By: Nancyann Burns M.D.   On: 06/11/2024 15:42   DG Chest Port 1 View Result Date: 06/11/2024 CLINICAL DATA:  Trauma.  Mechanical trip and fall. EXAM: PORTABLE CHEST 1 VIEW  COMPARISON:  CT chest 06/05/2024 FINDINGS: Shallow inspiration. Mild cardiac enlargement. No vascular congestion, edema, or consolidation. No pleural effusion or pneumothorax. Mediastinal contours appear intact. IMPRESSION: No active disease. Electronically Signed   By: Elsie Gravely M.D.   On: 06/11/2024 15:38     Medical Consultants:   None.   Subjective:    Timothy Moon Denies any chest pain or shortness of breath  Objective:    Vitals:   06/11/24 2005 06/11/24 2339 06/12/24 0239 06/12/24 0300  BP: 116/88 (!) 137/95  (!) 140/97  Pulse: 63 60  87  Resp: 20 20  14   Temp: 97.9 F (36.6 C) 97.8 F (36.6 C)  98.1 F (36.7 C)  TempSrc: Oral Oral  Oral  SpO2: 93% 99%  92%  Weight:   114.2 kg   Height:       SpO2: 92 %   Intake/Output Summary (Last 24 hours) at 06/12/2024 0601 Last data filed at 06/11/2024 2000 Gross per 24 hour  Intake 240 ml  Output --  Net 240 ml   Filed Weights   06/11/24 1337 06/12/24 0239  Weight: 114.9 kg 114.2 kg    Exam: General exam: In no acute distress. Respiratory system: Good air movement and clear to auscultation. Cardiovascular system: S1 & S2 heard, RRR. No JVD. Gastrointestinal system: Abdomen is nondistended, soft and nontender.  Extremities: No pedal edema. Skin: No rashes, lesions or ulcers Psychiatry: Judgement and insight appear normal. Mood & affect appropriate.    Data Reviewed:    Labs: Basic Metabolic Panel: Recent Labs  Lab 06/11/24 1346  NA 131*  K 3.4*  CL 99  CO2 20*  GLUCOSE 120*  BUN 39*  CREATININE 2.48*  CALCIUM  9.7  MG 2.4   GFR Estimated Creatinine Clearance: 39.4 mL/min (A) (by C-G formula based on SCr of 2.48 mg/dL (H)). Liver Function Tests: Recent Labs  Lab 06/11/24 1346  AST 46*  ALT 33  ALKPHOS 86  BILITOT 1.8*  PROT 8.0  ALBUMIN 4.0   No results for input(s): LIPASE, AMYLASE in the last 168 hours. No results for input(s): AMMONIA in the last 168  hours. Coagulation profile Recent Labs  Lab 06/11/24 1346  INR 1.3*   COVID-19 Labs  No results for input(s): DDIMER, FERRITIN, LDH, CRP in the last 72 hours.  Lab Results  Component Value Date   SARSCOV2NAA NEGATIVE 10/18/2021    CBC: Recent Labs  Lab 06/11/24 1346  WBC 12.8*  NEUTROABS 10.3*  HGB 18.1*  HCT 54.2*  MCV 87.8  PLT 220   Cardiac Enzymes: Recent Labs  Lab 06/11/24 1621  CKTOTAL 738*   BNP (last 3 results) No results for input(s): PROBNP in the last 8760 hours. CBG: No results for input(s): GLUCAP in the last 168 hours. D-Dimer: No results for input(s): DDIMER in the last 72 hours. Hgb A1c: No results for input(s): HGBA1C in the last 72 hours. Lipid Profile: No results for input(s): CHOL, HDL, LDLCALC, TRIG, CHOLHDL, LDLDIRECT in the last 72 hours. Thyroid  function studies: No results for input(s): TSH, T4TOTAL, T3FREE, THYROIDAB in the last 72 hours.  Invalid  input(s): FREET3 Anemia work up: No results for input(s): VITAMINB12, FOLATE, FERRITIN, TIBC, IRON, RETICCTPCT in the last 72 hours. Sepsis Labs: Recent Labs  Lab 06/11/24 1346  WBC 12.8*   Microbiology No results found for this or any previous visit (from the past 240 hours).   Medications:    amiodarone   200 mg Oral Daily   aspirin   81 mg Oral Pre-Cath   carvedilol   12.5 mg Oral BID WC   sodium chloride  flush  3 mL Intravenous Q12H   Continuous Infusions:  sodium chloride  75 mL/hr at 06/11/24 1721   sodium chloride      heparin  1,300 Units/hr (06/11/24 2227)      LOS: 0 days   Erle Odell Castor  [facility]  06/12/2024, 6:01 AM

## 2024-06-12 NOTE — Progress Notes (Signed)
 PHARMACY - ANTICOAGULATION CONSULT NOTE  Pharmacy Consult for Heparin  Indication: chest pain/ACS  No Known Allergies  Patient Measurements: Height: 6' 3 (190.5 cm) Weight: 114.2 kg (251 lb 12.8 oz) IBW/kg (Calculated) : 84.5 HEPARIN  DW (KG): 108.4  Vital Signs: Temp: 97.6 F (36.4 C) (08/27 0800) Temp Source: Oral (08/27 0800) BP: 142/94 (08/27 0844) Pulse Rate: 65 (08/27 0844)  Labs: Recent Labs    06/11/24 1346 06/11/24 1621 06/11/24 1916 06/11/24 2112 06/12/24 0639  HGB 18.1*  --   --   --  17.3*  HCT 54.2*  --   --   --  52.7*  PLT 220  --   --   --  195  APTT  --   --   --   --  54*  LABPROT 16.5*  --   --   --   --   INR 1.3*  --   --   --   --   HEPARINUNFRC  --   --   --   --  0.83*  CREATININE 2.48*  --   --   --  2.22*  CKTOTAL  --  738*  --   --   --   TROPONINIHS  --   --  2,090* 2,005*  --     Estimated Creatinine Clearance: 44 mL/min (A) (by C-G formula based on SCr of 2.22 mg/dL (H)).   Medical History: Past Medical History:  Diagnosis Date   Acute congestive heart failure (HCC) 10/19/2021   Ascending aortic aneurysm (HCC) 11/23/2021   4.9 cm in January 2023   Chronic kidney disease, stage 3a (HCC) 10/19/2021   Cocaine abuse (HCC) 10/19/2021   Elevated troponin level not due myocardial infarction 10/19/2021   Essential hypertension 10/19/2021   Nicotine dependence, cigarettes, uncomplicated 10/19/2021    Medications:  Infusions:   sodium chloride  75 mL/hr at 06/12/24 0604   sodium chloride      heparin  1,300 Units/hr (06/11/24 2227)    Assessment: 68 y.o. male with a hx of HFrEF, atrial fibrillation, thoracic aortic aneurysm 5 cm, cocaine use, nicotine dependence, hypertension, CKD. Patient on eliquis  at home, last dose 8/25 at 1900. Pharmacy consulted for Heparin  for ACS/STEMI. Will order paired heparin  levels and aPTTs for monitoring until correlating.  Heparin  level remains artificially inflated. aPTT under goal range. CBC stable with no issues  with heparin  infusion or signs of bleeding reported.  Goal of Therapy:  Heparin  level 0.3-0.7 units/ml aPTT 66-102 seconds Monitor platelets by anticoagulation protocol: Yes   Plan:  Increase heparin  infusion to 1400 units/hr Check heparin  level in 8 hours and daily while on heparin  Continue to monitor H&H and platelets  Thank you for allowing pharmacy to be a part of this patient's care.  Shelba Collier, PharmD, BCPS Clinical Pharmacist

## 2024-06-12 NOTE — Progress Notes (Signed)
 PHARMACY - ANTICOAGULATION CONSULT NOTE  Pharmacy Consult for Heparin  Indication: chest pain/ACS  No Known Allergies  Patient Measurements: Height: 6' 3 (190.5 cm) Weight: 114.2 kg (251 lb 12.8 oz) IBW/kg (Calculated) : 84.5 HEPARIN  DW (KG): 108.4  Vital Signs: Temp: 98.1 F (36.7 C) (08/27 1410) Temp Source: Oral (08/27 1410) BP: 167/143 (08/27 1500) Pulse Rate: 73 (08/27 1500)  Labs: Recent Labs    06/11/24 1346 06/11/24 1621 06/11/24 1916 06/11/24 2112 06/12/24 0639 06/12/24 1254 06/12/24 1300  HGB 18.1*  --   --   --  17.3* 16.3 16.3  HCT 54.2*  --   --   --  52.7* 48.0 48.0  PLT 220  --   --   --  195  --   --   APTT  --   --   --   --  54*  --   --   LABPROT 16.5*  --   --   --   --   --   --   INR 1.3*  --   --   --   --   --   --   HEPARINUNFRC  --   --   --   --  0.83*  --   --   CREATININE 2.48*  --   --   --  2.22*  --   --   CKTOTAL  --  738*  --   --   --   --   --   TROPONINIHS  --   --  2,090* 2,005*  --   --   --     Estimated Creatinine Clearance: 44 mL/min (A) (by C-G formula based on SCr of 2.22 mg/dL (H)).   Medical History: Past Medical History:  Diagnosis Date   Acute congestive heart failure (HCC) 10/19/2021   Ascending aortic aneurysm (HCC) 11/23/2021   4.9 cm in January 2023   Chronic kidney disease, stage 3a (HCC) 10/19/2021   Cocaine abuse (HCC) 10/19/2021   Elevated troponin level not due myocardial infarction 10/19/2021   Essential hypertension 10/19/2021   Nicotine dependence, cigarettes, uncomplicated 10/19/2021    Medications:  Infusions:   sodium chloride  75 mL/hr at 06/12/24 0604   sodium chloride      heparin       Assessment: 68 y.o. male with a hx of HFrEF, atrial fibrillation, thoracic aortic aneurysm 5 cm, cocaine use, nicotine dependence, hypertension, CKD. Patient on eliquis  at home, last dose 8/25 at 1900. Pharmacy consulted for Heparin  for ACS/STEMI. Will order paired heparin  levels and aPTTs for monitoring until  correlating.  Heparin  level remains artificially inflated. aPTT under goal range. CBC stable with no issues with heparin  infusion or signs of bleeding reported.  PM: now s/p cath, heparin  to resume 2hr after TR band removed (off at 17:15).  Goal of Therapy:  Heparin  level 0.3-0.7 units/ml aPTT 66-102 seconds Monitor platelets by anticoagulation protocol: Yes   Plan:  At 19:15, resume heparin  infusion at 1400 units/hr Check heparin  level and aPTT in 8 hours and daily while on heparin  Continue to monitor H&H and platelets  Thank you for allowing pharmacy to be a part of this patient's care.  Rocky Slade, PharmD, BCPS Clinical Pharmacist

## 2024-06-12 NOTE — TOC CAGE-AID Note (Signed)
 Transition of Care [facility]) - CAGE-AID Screening   Patient Details  Name: Timothy Moon MRN: [id number] Date of Birth: 07/18/1956  [facility] CM/SW Contact:    Deshanda Molitor E Giliana Vantil, LCSW Phone Number: 06/12/2024, 10:39 AM   Clinical Narrative: Patient reports he uses cocaine about once per month. Patient states he has been to rehab in the past 2x. Patient denies other substance use. Patient denies SA resource needs, however would like counseling resources. Unit CSW updated and resources added to AVS.   CAGE-AID Screening:    Have You Ever Felt You Ought to Cut Down on Your Drinking or Drug Use?: Yes Have People Annoyed You By Critizing Your Drinking Or Drug Use?: No Have You Felt Bad Or Guilty About Your Drinking Or Drug Use?: Yes Have You Ever Had a Drink or Used Drugs First Thing In The Morning to Steady Your Nerves or to Get Rid of a Hangover?: No CAGE-AID Score: 2  Substance Abuse Education Offered: Yes

## 2024-06-12 NOTE — Progress Notes (Signed)
  Echocardiogram 2D Echocardiogram has been performed.  Juliene JINNY Rucks 06/12/2024, 10:51 AM

## 2024-06-13 ENCOUNTER — Other Ambulatory Visit (HOSPITAL_COMMUNITY): Payer: Self-pay

## 2024-06-13 ENCOUNTER — Encounter (HOSPITAL_COMMUNITY): Payer: Self-pay | Admitting: Cardiovascular Disease

## 2024-06-13 DIAGNOSIS — I472 Ventricular tachycardia, unspecified: Secondary | ICD-10-CM | POA: Diagnosis not present

## 2024-06-13 LAB — CBC
HCT: 49.8 % (ref 39.0–52.0)
Hemoglobin: 16.7 g/dL (ref 13.0–17.0)
MCH: 29.6 pg (ref 26.0–34.0)
MCHC: 33.5 g/dL (ref 30.0–36.0)
MCV: 88.1 fL (ref 80.0–100.0)
Platelets: 185 K/uL (ref 150–400)
RBC: 5.65 MIL/uL (ref 4.22–5.81)
RDW: 15.2 % (ref 11.5–15.5)
WBC: 9.1 K/uL (ref 4.0–10.5)
nRBC: 0 % (ref 0.0–0.2)

## 2024-06-13 LAB — BASIC METABOLIC PANEL WITH GFR
Anion gap: 11 (ref 5–15)
BUN: 34 mg/dL — ABNORMAL HIGH (ref 8–23)
CO2: 22 mmol/L (ref 22–32)
Calcium: 9.5 mg/dL (ref 8.9–10.3)
Chloride: 108 mmol/L (ref 98–111)
Creatinine, Ser: 1.8 mg/dL — ABNORMAL HIGH (ref 0.61–1.24)
GFR, Estimated: 41 mL/min — ABNORMAL LOW (ref >60)
Glucose, Bld: 125 mg/dL — ABNORMAL HIGH (ref 70–99)
Potassium: 3.8 mmol/L (ref 3.5–5.1)
Sodium: 141 mmol/L (ref 135–145)

## 2024-06-13 LAB — HEPARIN LEVEL (UNFRACTIONATED): Heparin Unfractionated: 0.5 [IU]/mL (ref 0.30–0.70)

## 2024-06-13 LAB — APTT: aPTT: 78 s — ABNORMAL HIGH (ref 24–36)

## 2024-06-13 MED ORDER — APIXABAN 5 MG PO TABS
5.0000 mg | ORAL_TABLET | Freq: Two times a day (BID) | ORAL | Status: DC
  Administered 2024-06-13 – 2024-06-15 (×5): 5 mg via ORAL
  Filled 2024-06-13 (×5): qty 1

## 2024-06-13 MED ORDER — POTASSIUM CHLORIDE CRYS ER 20 MEQ PO TBCR: 40.0000 meq | EXTENDED_RELEASE_TABLET | Freq: Two times a day (BID) | ORAL | Status: DC

## 2024-06-13 MED ORDER — POTASSIUM CHLORIDE CRYS ER 20 MEQ PO TBCR
40.0000 meq | EXTENDED_RELEASE_TABLET | Freq: Once | ORAL | Status: AC
  Administered 2024-06-13: 40 meq via ORAL
  Filled 2024-06-13: qty 2

## 2024-06-13 MED ORDER — ISOSORBIDE MONONITRATE ER 60 MG PO TB24
60.0000 mg | ORAL_TABLET | Freq: Every day | ORAL | Status: DC
  Administered 2024-06-13 – 2024-06-15 (×3): 60 mg via ORAL
  Filled 2024-06-13 (×3): qty 1

## 2024-06-13 MED ORDER — HYDRALAZINE HCL 50 MG PO TABS
100.0000 mg | ORAL_TABLET | Freq: Three times a day (TID) | ORAL | Status: DC
  Administered 2024-06-13 – 2024-06-15 (×7): 100 mg via ORAL
  Filled 2024-06-13 (×7): qty 2

## 2024-06-13 MED ORDER — MEXILETINE HCL 150 MG PO CAPS
150.0000 mg | ORAL_CAPSULE | Freq: Two times a day (BID) | ORAL | Status: DC
  Administered 2024-06-13 – 2024-06-15 (×5): 150 mg via ORAL
  Filled 2024-06-13 (×6): qty 1

## 2024-06-13 MED ORDER — SACUBITRIL-VALSARTAN 24-26 MG PO TABS
1.0000 | ORAL_TABLET | Freq: Two times a day (BID) | ORAL | Status: DC
  Administered 2024-06-13 – 2024-06-15 (×5): 1 via ORAL
  Filled 2024-06-13 (×5): qty 1

## 2024-06-13 MED ORDER — ROSUVASTATIN CALCIUM 5 MG PO TABS
10.0000 mg | ORAL_TABLET | Freq: Every day | ORAL | Status: DC
  Administered 2024-06-13 – 2024-06-15 (×3): 10 mg via ORAL
  Filled 2024-06-13 (×3): qty 2

## 2024-06-13 MED ORDER — DAPAGLIFLOZIN PROPANEDIOL 10 MG PO TABS
10.0000 mg | ORAL_TABLET | Freq: Every day | ORAL | Status: DC
  Administered 2024-06-13 – 2024-06-15 (×3): 10 mg via ORAL
  Filled 2024-06-13 (×3): qty 1

## 2024-06-13 NOTE — Progress Notes (Signed)
 [facility] PROGRESS NOTE    Progress Note  Timothy Moon  FMW:[id number] DOB: 10-08-1956 DOA: 06/11/2024 PCP: Lucius Krabbe, NP     Brief Narrative:   [name] is an 68 y.o. male past medical history significant for hypertension HFrEF EF of 30%, with a large aortic aneurysm 5 cm by CT on July 2023, he also missed his CT surgery appointment has not had a left heart cath due to renal disease, chronic atrial fibrillation on Eliquis  who had failed DCCV on 11/23 and this is was the last time he saw his cardiologist until 05/24/2024, tobacco abuse chronic kidney disease stage IIIa cocaine use comes in after fall, according to EMS was found in VT's in the 200s, which required cardioversion by EMS in the ED UDS was positive for cocaine, potassium 3.4 creatinine of 2.4, white blood cell count of 13 hemoglobin of 18.  Assessment/Plan:   Sustained Ventricular tachycardia (HCC)/chronic atrial fibrillation In a patient with severe cardiomyopathy, the arrhythmia occurred despite receiving a load of amiodarone  . Cardiology was consulted recommended ischemic, status post left and right cardiac cath that showed nonobstructive coronary artery disease Continue further management per cardiology, continue amiodarone  IV, Coreg . Try to keep magnesium greater than 2 potassium greater than 4. Hold Eliquis  continue IV heparin .  Fall: Likely due to VT's.  Leukocytosis: Likely reactive.  Now resolved.  Acute kidney injury superimposed on chronic kidney disease stage IIIb: Baseline creatinine is around 1.8-2. Basic metabolic panel is pending this morning.  Erythrocytosis: Likely due to hemoconcentration. Being hydrated CBC is pending this morning.  Hypovolemic hyponatremia : Start on IV fluids basic metabolic panels pending this morning.  Hypokalemia/hypomagnesemia: 3.6 this morning replete recheck in the morning  HFrEF with an EF of 30% Last 2D echo in 2023 showed an EF of 20 to  30%. Positive about a liter. Basic metabolic panel is pending.  Essential hypertension: Continue current regimen.  Coreg  for now.  Cocaine abuse: He has been counseled.  Obesity (BMI 30-39.9) noted  DVT prophylaxis: lovenox  Family Communication:none Status is: Observation The patient will require care spanning > 2 midnights and should be moved to inpatient because: VT's    Code Status:     Code Status Orders  (From admission, onward)           Start     Ordered   06/11/24 1621  Full code  Continuous       Question:  By:  Answer:  Consent: discussion documented in EHR   06/11/24 1626           Code Status History     Date Active Date Inactive Code Status Order ID Comments User Context   10/19/2021 0509 10/21/2021 1642 Full Code [id number]  Kenard Zachary PARAS, MD ED         IV Access:   Peripheral IV   Procedures and diagnostic studies:   ECHOCARDIOGRAM COMPLETE Result Date: 06/12/2024    ECHOCARDIOGRAM REPORT   Patient Name:   Timothy Moon [facility] Date of Exam: 06/12/2024 Medical Rec #:  [id number]        Height:       75.0 in Accession #:    [id number]       Weight:       251.8 lb Date of Birth:  Oct 01, 1956       BSA:          2.420 m Patient Age:    24 years  BP:           142/92 mmHg Patient Gender: M                HR:           63 bpm. Exam Location:  Inpatient Procedure: 2D Echo, Color Doppler, Cardiac Doppler and Intracardiac            Opacification Agent (Both Spectral and Color Flow Doppler were            utilized during procedure). Indications:    Abnormal ECG  History:        Patient has prior history of Echocardiogram examinations, most                 recent 10/19/2021. CHF, Abnormal ECG, Arrythmias:Atrial                 Fibrillation and Tachycardia; Risk Factors:Current Smoker and                 Hypertension.  Sonographer:    Juliene Rucks Referring Phys: [phone] RONDELL A SMITH IMPRESSIONS  1. Left ventricular ejection fraction, by estimation, is  25 to 30%. The left ventricle has severely decreased function. The left ventricle demonstrates global hypokinesis. There is moderate concentric left ventricular hypertrophy. Left ventricular diastolic function could not be evaluated.  2. Right ventricular systolic function is normal. The right ventricular size is normal.  3. The mitral valve is grossly normal. Trivial mitral valve regurgitation.  4. The aortic valve is grossly normal. Aortic valve regurgitation is mild.  5. Aortic dilatation noted. There is dilatation of the aortic root, measuring 48 mm. FINDINGS  Left Ventricle: Left ventricular ejection fraction, by estimation, is 25 to 30%. The left ventricle has severely decreased function. The left ventricle demonstrates global hypokinesis. The left ventricular internal cavity size was normal in size. There is moderate concentric left ventricular hypertrophy. Left ventricular diastolic function could not be evaluated due to atrial fibrillation. Left ventricular diastolic function could not be evaluated. Right Ventricle: The right ventricular size is normal. No increase in right ventricular wall thickness. Right ventricular systolic function is normal. Left Atrium: Left atrial size was normal in size. Right Atrium: Right atrial size was normal in size. Pericardium: There is no evidence of pericardial effusion. Mitral Valve: The mitral valve is grossly normal. Trivial mitral valve regurgitation. Tricuspid Valve: The tricuspid valve is grossly normal. Tricuspid valve regurgitation is trivial. Aortic Valve: The aortic valve is grossly normal. Aortic valve regurgitation is mild. Pulmonic Valve: The pulmonic valve was not well visualized. Pulmonic valve regurgitation is not visualized. Aorta: Aortic dilatation noted. There is dilatation of the aortic root, measuring 48 mm. IAS/Shunts: No atrial level shunt detected by color flow Doppler.   LV Volumes (MOD) LV vol d, MOD A2C: 183.0 ml LV vol d, MOD A4C: 243.0 ml LV  vol s, MOD A2C: 104.0 ml LV vol s, MOD A4C: 153.0 ml LV SV MOD A2C:     79.0 ml LV SV MOD A4C:     243.0 ml LV SV MOD BP:      83.5 ml Aditya Sabharwal Electronically signed by Ria Commander Signature Date/Time: 06/12/2024/7:25:09 PM    Final    CARDIAC CATHETERIZATION Result Date: 06/12/2024   Prox LAD to Mid LAD lesion is 40% stenosed.   Prox RCA lesion is 30% stenosed.   RPAV lesion is 60% stenosed. 1.  Ectatic coronary vessels especially the right coronary artery with mild to  moderate nonobstructive coronary artery disease. 2.  Left ventricular angiography was not performed.  EF was moderately severely reduced by echo. 3.  Right heart catheterization showed mildly elevated right atrial pressure, moderately elevated wedge pressure, mild pulmonary hypertension and mildly reduced cardiac output. 4.  Difficult engagement of the coronary arteries due to ascending aortic aneurysm and tortuosity of the innominate artery Recommendations: Medical therapy for nonischemic cardiomyopathy.   CT HEAD WO CONTRAST Result Date: 06/11/2024 CLINICAL DATA:  Fall, scalp laceration EXAM: CT HEAD WITHOUT CONTRAST TECHNIQUE: Contiguous axial images were obtained from the base of the skull through the vertex without intravenous contrast. RADIATION DOSE REDUCTION: This exam was performed according to the departmental dose-optimization program which includes automated exposure control, adjustment of the mA and/or kV according to patient size and/or use of iterative reconstruction technique. 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 There is no hemorrhage. No acute ischemic changes. No mass lesion. The ventricles are normal. Skull/sinuses/orbits: No significant abnormality. 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  No acute abnormality. Electronically Signed   By: Nancyann Burns M.D.   On: 06/11/2024 15:42   DG Chest Port 1 View Result Date: 06/11/2024 CLINICAL  DATA:  Trauma.  Mechanical trip and fall. EXAM: PORTABLE CHEST 1 VIEW COMPARISON:  CT chest 06/05/2024 FINDINGS: Shallow inspiration. Mild cardiac enlargement. No vascular congestion, edema, or consolidation. No pleural effusion or pneumothorax. Mediastinal contours appear intact. IMPRESSION: No active disease. Electronically Signed   By: Elsie Gravely M.D.   On: 06/11/2024 15:38     Medical Consultants:   None.   Subjective:    CORTLIN MARANO denies any chest pain or shortness of breath  Objective:    Vitals:   06/12/24 1500 06/12/24 2028 06/12/24 2308 06/13/24 0302  BP: (!) 167/143 138/86 137/79 (!) 158/97  Pulse: 73 77 76 64  Resp: 15 20 20 20   Temp:  99.5 F (37.5 C) 97.9 F (36.6 C) 98.3 F (36.8 C)  TempSrc:  Oral Oral Oral  SpO2: 100% 93% 97% 97%  Weight:    115.5 kg  Height:       SpO2: 97 %   Intake/Output Summary (Last 24 hours) at 06/13/2024 0710 Last data filed at 06/13/2024 0655 Gross per 24 hour  Intake 1915.88 ml  Output 2000 ml  Net -84.12 ml   Filed Weights   06/11/24 1337 06/12/24 0239 06/13/24 0302  Weight: 114.9 kg 114.2 kg 115.5 kg    Exam: General exam: In no acute distress. Respiratory system: Good air movement and clear to auscultation. Cardiovascular system: S1 & S2 heard, RRR. No JVD. Gastrointestinal system: Abdomen is nondistended, soft and nontender.  Extremities: No pedal edema. Skin: No rashes, lesions or ulcers Psychiatry: Judgement and insight appear normal. Mood & affect appropriate.  Data Reviewed:    Labs: Basic Metabolic Panel: Recent Labs  Lab 06/11/24 1346 06/12/24 0639 06/12/24 1254 06/12/24 1300  NA 131* 139 143 143  K 3.4* 4.6 3.6 3.6  CL 99 103  --   --   CO2 20* 26  --   --   GLUCOSE 120* 90  --   --   BUN 39* 36*  --   --   CREATININE 2.48* 2.22*  --   --   CALCIUM  9.7 9.8  --   --   MG 2.4 2.5*  --   --    GFR Estimated Creatinine Clearance: 44.3  mL/min (A) (by C-G formula based on SCr of  2.22 mg/dL (H)). Liver Function Tests: Recent Labs  Lab 06/11/24 1346  AST 46*  ALT 33  ALKPHOS 86  BILITOT 1.8*  PROT 8.0  ALBUMIN 4.0   No results for input(s): LIPASE, AMYLASE in the last 168 hours. No results for input(s): AMMONIA in the last 168 hours. Coagulation profile Recent Labs  Lab 06/11/24 1346  INR 1.3*   COVID-19 Labs  No results for input(s): DDIMER, FERRITIN, LDH, CRP in the last 72 hours.  Lab Results  Component Value Date   SARSCOV2NAA NEGATIVE 10/18/2021    CBC: Recent Labs  Lab 06/11/24 1346 06/12/24 0639 06/12/24 1254 06/12/24 1300  WBC 12.8* 10.5  --   --   NEUTROABS 10.3*  --   --   --   HGB 18.1* 17.3* 16.3 16.3  HCT 54.2* 52.7* 48.0 48.0  MCV 87.8 88.3  --   --   PLT 220 195  --   --    Cardiac Enzymes: Recent Labs  Lab 06/11/24 1621  CKTOTAL 738*   BNP (last 3 results) No results for input(s): PROBNP in the last 8760 hours. CBG: No results for input(s): GLUCAP in the last 168 hours. D-Dimer: No results for input(s): DDIMER in the last 72 hours. Hgb A1c: No results for input(s): HGBA1C in the last 72 hours. Lipid Profile: Recent Labs    06/12/24 0639  CHOL 150  HDL 44  LDLCALC 99  TRIG 37  CHOLHDL 3.4   Thyroid  function studies: Recent Labs    06/12/24 0639  TSH 1.036   Anemia work up: No results for input(s): VITAMINB12, FOLATE, FERRITIN, TIBC, IRON, RETICCTPCT in the last 72 hours. Sepsis Labs: Recent Labs  Lab 06/11/24 1346 06/12/24 0639  WBC 12.8* 10.5   Microbiology No results found for this or any previous visit (from the past 240 hours).   Medications:    amiodarone   200 mg Oral Daily   carvedilol   12.5 mg Oral BID WC   sodium chloride  flush  3 mL Intravenous Q12H   sodium chloride  flush  3 mL Intravenous Q12H   Continuous Infusions:  sodium chloride  75 mL/hr at 06/13/24 0655   sodium chloride      heparin  1,400 Units/hr (06/13/24 0655)      LOS: 1 day    Erle Odell Castor  [facility]  06/13/2024, 7:10 AM

## 2024-06-13 NOTE — Progress Notes (Signed)
 Progress Note  Patient Name: Timothy Moon Date of Encounter: 06/13/2024 [facility] Cardiologist: Redell Shallow, MD   Interval Summary   No problems with chest pain or shortness of breath overnight.  Right radial cath access site has not bothered him. No ventricular arrhythmia.  Remains in atrial fibrillation with slow ventricular response.  Vital Signs Vitals:   06/12/24 2028 06/12/24 2308 06/13/24 0302 06/13/24 0809  BP: 138/86 137/79 (!) 158/97 (!) 157/106  Pulse: 77 76 64 80  Resp: 20 20 20 15   Temp: 99.5 F (37.5 C) 97.9 F (36.6 C) 98.3 F (36.8 C) 97.6 F (36.4 C)  TempSrc: Oral Oral Oral Oral  SpO2: 93% 97% 97% 93%  Weight:   115.5 kg   Height:        Intake/Output Summary (Last 24 hours) at 06/13/2024 0919 Last data filed at 06/13/2024 0655 Gross per 24 hour  Intake 1915.88 ml  Output 1400 ml  Net 515.88 ml      06/13/2024    3:02 AM 06/12/2024    2:39 AM 06/11/2024    1:37 PM  Last 3 Weights  Weight (lbs) 254 lb 11.2 oz 251 lb 12.8 oz 253 lb 3.2 oz  Weight (kg) 115.531 kg 114.216 kg 114.851 kg      Telemetry/ECG  Atrial fibrillation with slow ventricular response, rare PVCs- Personally Reviewed  Physical Exam  GEN: No acute distress.   Neck: No JVD Cardiac: Healthy right radial cath access site.  Irregular, no murmurs, rubs, or gallops.  Respiratory: Clear to auscultation bilaterally. GI: Soft, nontender, non-distended  MS: No edema  Assessment & Plan  VT: Sustained monomorphic, symptomatic.  Discussed ICD placement with EP, but they would like to delay in view of the patient's urine drug screen confirming recent cocaine use.  Reinforced the importance of abstinence from cocaine due to the risk of lethal ventricular arrhythmia.  He reports that the last event convinced him of this.  Continue carvedilol  and amiodarone .  Discussed with Dr. Inocencio.  Add mexiletine 150 mg twice daily until follow-up visit.  Watch for 24 hours after starting  mexiletine. AFib: Unfortunately the recent synchronized cardioversion for VT did not take care of the atrial fibrillation.  He remains scheduled for elective cardioversion 06/19/2024.  Even though his Eliquis  was interrupted he had uninterrupted anticoagulation with intravenous heparin  during the bradycardia and Eliquis .  Continue amiodarone  200 mg twice daily until the cardioversion.  May need to decrease the dose of carvedilol  if she develops symptomatic bradycardia. CHF: Asymptomatic at rest.  Received IV fluids for renal protection prior to contrast angiography.  Hydralazine /nitrates were interrupted.  At the time of cardiac catheterization PAWP was moderately elevated at 23 mmHg and cardiac output was relatively low with an index of 2.1.  Restart hydralazine /nitrates and Farxiga .  Explore options for financial support for Entresto  as an outpatient.  Even with his baseline renal dysfunction I think Entresto  could be beneficial. CAD: Moderate nonobstructive lesions at angiography.  No revascularization needed.  Focus on risk factors.  Most recent lipid profile on this admission showed LDL 99, but on the same medications his LDL was in the 50-70 range in the past.  Lower dose rosuvastatin  should suffice. Asc Ao Aneurysm: Asymptomatic.  Not yet at target for surgery.  Has regular appointments with Dr. Kerrin to monitor this. Unfortunately he missed his 06/11/2024 appointment since he was hospitalized.   For questions or updates, please contact [facility] Please consult [email or web address] for contact info  under       Signed, Jerel Balding, MD

## 2024-06-14 ENCOUNTER — Telehealth (HOSPITAL_COMMUNITY): Payer: Self-pay

## 2024-06-14 DIAGNOSIS — I472 Ventricular tachycardia, unspecified: Secondary | ICD-10-CM | POA: Diagnosis not present

## 2024-06-14 LAB — BASIC METABOLIC PANEL WITH GFR
Anion gap: 9 (ref 5–15)
BUN: 30 mg/dL — ABNORMAL HIGH (ref 8–23)
CO2: 21 mmol/L — ABNORMAL LOW (ref 22–32)
Calcium: 9.8 mg/dL (ref 8.9–10.3)
Chloride: 110 mmol/L (ref 98–111)
Creatinine, Ser: 2.04 mg/dL — ABNORMAL HIGH (ref 0.61–1.24)
GFR, Estimated: 35 mL/min — ABNORMAL LOW (ref >60)
Glucose, Bld: 110 mg/dL — ABNORMAL HIGH (ref 70–99)
Potassium: 4.3 mmol/L (ref 3.5–5.1)
Sodium: 140 mmol/L (ref 135–145)

## 2024-06-14 LAB — CBC
HCT: 51.1 % (ref 39.0–52.0)
Hemoglobin: 16.9 g/dL (ref 13.0–17.0)
MCH: 29.3 pg (ref 26.0–34.0)
MCHC: 33.1 g/dL (ref 30.0–36.0)
MCV: 88.6 fL (ref 80.0–100.0)
Platelets: 199 K/uL (ref 150–400)
RBC: 5.77 MIL/uL (ref 4.22–5.81)
RDW: 15.1 % (ref 11.5–15.5)
WBC: 13.4 K/uL — ABNORMAL HIGH (ref 4.0–10.5)
nRBC: 0 % (ref 0.0–0.2)

## 2024-06-14 NOTE — H&P (View-Only) (Signed)
  Progress Note  Patient Name: Timothy Moon Date of Encounter: 06/14/2024 [facility] Cardiologist: Redell Shallow, MD   Interval Summary   No significant ventricular arrhythmia overnight. Maybe some mild heartburn with mexiletine, but no other obvious side effects. Reports good UO (not all recorded). Weight is down 2 kg from yesterday and lower than on admission. Denies dyspnea. BP was good overnight, high this AM (likely due to short duration of hydralazine  effect).  Vital Signs Vitals:   06/14/24 0355 06/14/24 0408 06/14/24 0748 06/14/24 0753  BP:  (!) 151/102 (!) 150/105 (!) 150/105  Pulse:  62 75 75  Resp:  20 18   Temp:   97.6 F (36.4 C)   TempSrc:   Oral   SpO2:  95% 98%   Weight: 113.6 kg     Height:        Intake/Output Summary (Last 24 hours) at 06/14/2024 0900 Last data filed at 06/14/2024 0730 Gross per 24 hour  Intake 440 ml  Output 1550 ml  Net -1110 ml      06/14/2024    3:55 AM 06/13/2024    3:02 AM 06/12/2024    2:39 AM  Last 3 Weights  Weight (lbs) 250 lb 6.4 oz 254 lb 11.2 oz 251 lb 12.8 oz  Weight (kg) 113.581 kg 115.531 kg 114.216 kg      Telemetry/ECG  AFib, rate controlled - Personally Reviewed  Physical Exam  GEN: No acute distress.   Neck: No JVD Cardiac: irregular, no murmurs, rubs, or gallops.  Respiratory: Clear to auscultation bilaterally. GI: Soft, nontender, non-distended  MS: No edema  Assessment & Plan   Sustained monomorphic VT with near-syncope in a patient with persistent AFib, nonischemic CMP with severely reduced LVEF, recent cocaine use. Although Eliquis  was briefly interrupted for heart cath this admission, he was covered with IV heparin  during the pause. Entresto  started this admission, will need to follow renal function.  [facility] will sign off.   The patient is ready for discharge today from a cardiac standpoint. Medication Recommendations:   Mexiletine 150 mg twice daily (new) Entresto   24-26 mg twice daily (new) Stop aspirin  Other cardiac medications are unchanged Hydralazine  100 mg 3 times daily Isosorbide  mononitrate 60 mg once daily Eliquis  5 mg twice daily Farxiga  10 mg daily Amiodarone  200 mg daily Furosemide  80 mg daily Potassium chloride  20 mEq daily Other recommendations (labs, testing, etc): Please check basic metabolic panel when he comes in for his cardioversion 06/19/2024 Follow up as an outpatient:  following his cardioversion 06/19/2024 he has an EP appointment 07/01/2024 and heart failure clinic appointment 07/05/2024 For questions or updates, please contact [facility] Please consult [email or web address] for contact info under       Signed, Jerel Balding, MD

## 2024-06-14 NOTE — Progress Notes (Signed)
  Progress Note  Patient Name: Timothy Moon Date of Encounter: 06/14/2024 [facility] Cardiologist: Redell Shallow, MD   Interval Summary   No significant ventricular arrhythmia overnight. Maybe some mild heartburn with mexiletine, but no other obvious side effects. Reports good UO (not all recorded). Weight is down 2 kg from yesterday and lower than on admission. Denies dyspnea. BP was good overnight, high this AM (likely due to short duration of hydralazine  effect).  Vital Signs Vitals:   06/14/24 0355 06/14/24 0408 06/14/24 0748 06/14/24 0753  BP:  (!) 151/102 (!) 150/105 (!) 150/105  Pulse:  62 75 75  Resp:  20 18   Temp:   97.6 F (36.4 C)   TempSrc:   Oral   SpO2:  95% 98%   Weight: 113.6 kg     Height:        Intake/Output Summary (Last 24 hours) at 06/14/2024 0900 Last data filed at 06/14/2024 0730 Gross per 24 hour  Intake 440 ml  Output 1550 ml  Net -1110 ml      06/14/2024    3:55 AM 06/13/2024    3:02 AM 06/12/2024    2:39 AM  Last 3 Weights  Weight (lbs) 250 lb 6.4 oz 254 lb 11.2 oz 251 lb 12.8 oz  Weight (kg) 113.581 kg 115.531 kg 114.216 kg      Telemetry/ECG  AFib, rate controlled - Personally Reviewed  Physical Exam  GEN: No acute distress.   Neck: No JVD Cardiac: irregular, no murmurs, rubs, or gallops.  Respiratory: Clear to auscultation bilaterally. GI: Soft, nontender, non-distended  MS: No edema  Assessment & Plan   Sustained monomorphic VT with near-syncope in a patient with persistent AFib, nonischemic CMP with severely reduced LVEF, recent cocaine use. Although Eliquis  was briefly interrupted for heart cath this admission, he was covered with IV heparin  during the pause. Entresto  started this admission, will need to follow renal function.  [facility] will sign off.   The patient is ready for discharge today from a cardiac standpoint. Medication Recommendations:   Mexiletine 150 mg twice daily (new) Entresto   24-26 mg twice daily (new) Stop aspirin  Other cardiac medications are unchanged Hydralazine  100 mg 3 times daily Isosorbide  mononitrate 60 mg once daily Eliquis  5 mg twice daily Farxiga  10 mg daily Amiodarone  200 mg daily Furosemide  80 mg daily Potassium chloride  20 mEq daily Other recommendations (labs, testing, etc): Please check basic metabolic panel when he comes in for his cardioversion 06/19/2024 Follow up as an outpatient:  following his cardioversion 06/19/2024 he has an EP appointment 07/01/2024 and heart failure clinic appointment 07/05/2024 For questions or updates, please contact [facility] Please consult [email or web address] for contact info under       Signed, Jerel Balding, MD

## 2024-06-14 NOTE — Plan of Care (Signed)
  Problem: Nutrition: Goal: Adequate nutrition will be maintained Outcome: Progressing   Problem: Activity: Goal: Risk for activity intolerance will decrease Outcome: Progressing   Problem: Skin Integrity: Goal: Risk for impaired skin integrity will decrease Outcome: Progressing   Problem: Cardiovascular: Goal: Ability to achieve and maintain adequate cardiovascular perfusion will improve Outcome: Progressing Goal: Vascular access site(s) Level 0-1 will be maintained Outcome: Progressing

## 2024-06-14 NOTE — Telephone Encounter (Signed)
 Advanced Heart Failure Patient Advocate Encounter  Application for Entresto  faxed to [employer] on 06/14/2024. Application form attached to patient chart.  Rachel DEL, CPhT Rx Patient Advocate Phone: [phone]

## 2024-06-14 NOTE — Progress Notes (Signed)
 [facility] PROGRESS NOTE    Progress Note  WOODFIN KISS  FMW:[id number] DOB: 02/16/1956 DOA: 06/11/2024 PCP: Lucius Krabbe, NP     Brief Narrative:   Timothy Moon is an 68 y.o. male past medical history significant for hypertension HFrEF EF of 30%, with a large aortic aneurysm 5 cm by CT on July 2023, he also missed his CT surgery appointment has not had a left heart cath due to renal disease, chronic atrial fibrillation on Eliquis  who had failed DCCV on 11/23 and this is was the last time he saw his cardiologist until 05/24/2024, tobacco abuse chronic kidney disease stage IIIa cocaine use comes in after fall, according to EMS was found in VT's in the 200s, which required cardioversion by EMS in the ED UDS was positive for cocaine, potassium 3.4 creatinine of 2.4, white blood cell count of 13 hemoglobin of 18.  Assessment/Plan:   Sustained Ventricular tachycardia (HCC)/chronic atrial fibrillation Cardiology was consulted recommended ischemic, status post left and right cardiac cath that showed nonobstructive coronary artery disease, EP was curb sided and they recommended to delay AICD in light of cocaine use.  We have reinforced the importance of abstinence. Continue further management per cardiology, continue oral amiodarone , Coreg  and mexiletine. Try to keep magnesium greater than 2 potassium greater than 4. Continue oral Eliquis . He remains scheduled for [facility] cardioversion 06/19/2024 will continue Eliquis  and amiodarone  200 mg twice a day until cardioversion.  Fall: Likely due to VT's.  Leukocytosis: Likely reactive.  Now resolved.  Acute kidney injury superimposed on chronic kidney disease stage IIIb: Baseline creatinine is around 1.8-2. He has returned to baseline.  Erythrocytosis: Likely due to hemoconcentration.  Hypovolemic hyponatremia : Resolved with IV fluids.  Hypokalemia/hypomagnesemia: Try to keep potassium greater than 4 magnesium greater than  2  HFrEF with an EF of 30% Last 2D echo in 2023 showed an EF of 20 to 30%. Positive about a liter. Restarted on hydralazine  and nitrates and Farxiga . Cardiology to evaluate as an outpatient for financial options for Entresto  as an outpatient.  Essential hypertension: Continue Coreg , hydralazine  nitrates  Cocaine abuse: He has been counseled.  Obesity (BMI 30-39.9) noted  DVT prophylaxis: lovenox  Family Communication:none Status is: Observation The patient will require care spanning > 2 midnights and should be moved to inpatient because: VT's    Code Status:     Code Status Orders  (From admission, onward)           Start     Ordered   06/11/24 1621  Full code  Continuous       Question:  By:  Answer:  Consent: discussion documented in EHR   06/11/24 1626           Code Status History     Date Active Date Inactive Code Status Order ID Comments User Context   10/19/2021 0509 10/21/2021 1642 Full Code [id number]  Kenard Zachary PARAS, MD ED         IV Access:   Peripheral IV   Procedures and diagnostic studies:   ECHOCARDIOGRAM COMPLETE Result Date: 06/12/2024    ECHOCARDIOGRAM REPORT   Patient Name:   Timothy Moon [facility] Date of Exam: 06/12/2024 Medical Rec #:  [id number]        Height:       75.0 in Accession #:    [id number]       Weight:       251.8 lb Date of Birth:  1956-06-30  BSA:          2.420 m Patient Age:    67 years         BP:           142/92 mmHg Patient Gender: M                HR:           63 bpm. Exam Location:  Inpatient Procedure: 2D Echo, Color Doppler, Cardiac Doppler and Intracardiac            Opacification Agent (Both Spectral and Color Flow Doppler were            utilized during procedure). Indications:    Abnormal ECG  History:        Patient has prior history of Echocardiogram examinations, most                 recent 10/19/2021. CHF, Abnormal ECG, Arrythmias:Atrial                 Fibrillation and Tachycardia; Risk Factors:Current  Smoker and                 Hypertension.  Sonographer:    Juliene Rucks Referring Phys: [phone] RONDELL A SMITH IMPRESSIONS  1. Left ventricular ejection fraction, by estimation, is 25 to 30%. The left ventricle has severely decreased function. The left ventricle demonstrates global hypokinesis. There is moderate concentric left ventricular hypertrophy. Left ventricular diastolic function could not be evaluated.  2. Right ventricular systolic function is normal. The right ventricular size is normal.  3. The mitral valve is grossly normal. Trivial mitral valve regurgitation.  4. The aortic valve is grossly normal. Aortic valve regurgitation is mild.  5. Aortic dilatation noted. There is dilatation of the aortic root, measuring 48 mm. FINDINGS  Left Ventricle: Left ventricular ejection fraction, by estimation, is 25 to 30%. The left ventricle has severely decreased function. The left ventricle demonstrates global hypokinesis. The left ventricular internal cavity size was normal in size. There is moderate concentric left ventricular hypertrophy. Left ventricular diastolic function could not be evaluated due to atrial fibrillation. Left ventricular diastolic function could not be evaluated. Right Ventricle: The right ventricular size is normal. No increase in right ventricular wall thickness. Right ventricular systolic function is normal. Left Atrium: Left atrial size was normal in size. Right Atrium: Right atrial size was normal in size. Pericardium: There is no evidence of pericardial effusion. Mitral Valve: The mitral valve is grossly normal. Trivial mitral valve regurgitation. Tricuspid Valve: The tricuspid valve is grossly normal. Tricuspid valve regurgitation is trivial. Aortic Valve: The aortic valve is grossly normal. Aortic valve regurgitation is mild. Pulmonic Valve: The pulmonic valve was not well visualized. Pulmonic valve regurgitation is not visualized. Aorta: Aortic dilatation noted. There is dilatation of  the aortic root, measuring 48 mm. IAS/Shunts: No atrial level shunt detected by color flow Doppler.   LV Volumes (MOD) LV vol d, MOD A2C: 183.0 ml LV vol d, MOD A4C: 243.0 ml LV vol s, MOD A2C: 104.0 ml LV vol s, MOD A4C: 153.0 ml LV SV MOD A2C:     79.0 ml LV SV MOD A4C:     243.0 ml LV SV MOD BP:      83.5 ml Aditya Sabharwal Electronically signed by Ria Commander Signature Date/Time: 06/12/2024/7:25:09 PM    Final    CARDIAC CATHETERIZATION Result Date: 06/12/2024   Prox LAD to Mid LAD lesion is 40% stenosed.  Prox RCA lesion is 30% stenosed.   RPAV lesion is 60% stenosed. 1.  Ectatic coronary vessels especially the right coronary artery with mild to moderate nonobstructive coronary artery disease. 2.  Left ventricular angiography was not performed.  EF was moderately severely reduced by echo. 3.  Right heart catheterization showed mildly elevated right atrial pressure, moderately elevated wedge pressure, mild pulmonary hypertension and mildly reduced cardiac output. 4.  Difficult engagement of the coronary arteries due to ascending aortic aneurysm and tortuosity of the innominate artery Recommendations: Medical therapy for nonischemic cardiomyopathy.     Medical Consultants:   None.   Subjective:    Ryan CHRISTELLA Side feels better.  Relates he had some quivering overnight  Objective:    Vitals:   06/13/24 2340 06/14/24 0346 06/14/24 0355 06/14/24 0408  BP: (!) 154/99 (!) 147/107  (!) 151/102  Pulse: 69 61  62  Resp: 20 20  20   Temp: 98.2 F (36.8 C) 98.2 F (36.8 C)    TempSrc: Oral Oral    SpO2: 98% 96%  95%  Weight:   113.6 kg   Height:       SpO2: 95 %   Intake/Output Summary (Last 24 hours) at 06/14/2024 0744 Last data filed at 06/14/2024 0730 Gross per 24 hour  Intake 440 ml  Output 1550 ml  Net -1110 ml   Filed Weights   06/12/24 0239 06/13/24 0302 06/14/24 0355  Weight: 114.2 kg 115.5 kg 113.6 kg    Exam: General exam: In no acute distress. Respiratory  system: Good air movement and clear to auscultation. Cardiovascular system: S1 & S2 heard, RRR. No JVD. Gastrointestinal system: Abdomen is nondistended, soft and nontender.  Extremities: No pedal edema. Skin: No rashes, lesions or ulcers Psychiatry: Judgement and insight appear normal. Mood & affect appropriate.  Data Reviewed:    Labs: Basic Metabolic Panel: Recent Labs  Lab 06/11/24 1346 06/12/24 0639 06/12/24 1254 06/12/24 1300 06/13/24 0842 06/14/24 0343  NA 131* 139 143 143 141 140  K 3.4* 4.6 3.6 3.6 3.8 4.3  CL 99 103  --   --  108 110  CO2 20* 26  --   --  22 21*  GLUCOSE 120* 90  --   --  125* 110*  BUN 39* 36*  --   --  34* 30*  CREATININE 2.48* 2.22*  --   --  1.80* 2.04*  CALCIUM  9.7 9.8  --   --  9.5 9.8  MG 2.4 2.5*  --   --   --   --    GFR Estimated Creatinine Clearance: 47.8 mL/min (A) (by C-G formula based on SCr of 2.04 mg/dL (H)). Liver Function Tests: Recent Labs  Lab 06/11/24 1346  AST 46*  ALT 33  ALKPHOS 86  BILITOT 1.8*  PROT 8.0  ALBUMIN 4.0   No results for input(s): LIPASE, AMYLASE in the last 168 hours. No results for input(s): AMMONIA in the last 168 hours. Coagulation profile Recent Labs  Lab 06/11/24 1346  INR 1.3*   COVID-19 Labs  No results for input(s): DDIMER, FERRITIN, LDH, CRP in the last 72 hours.  Lab Results  Component Value Date   SARSCOV2NAA NEGATIVE 10/18/2021    CBC: Recent Labs  Lab 06/11/24 1346 06/12/24 0639 06/12/24 1254 06/12/24 1300 06/13/24 0842 06/14/24 0343  WBC 12.8* 10.5  --   --  9.1 13.4*  NEUTROABS 10.3*  --   --   --   --   --  HGB 18.1* 17.3* 16.3 16.3 16.7 16.9  HCT 54.2* 52.7* 48.0 48.0 49.8 51.1  MCV 87.8 88.3  --   --  88.1 88.6  PLT 220 195  --   --  185 199   Cardiac Enzymes: Recent Labs  Lab 06/11/24 1621  CKTOTAL 738*   BNP (last 3 results) No results for input(s): PROBNP in the last 8760 hours. CBG: No results for input(s): GLUCAP in the last  168 hours. D-Dimer: No results for input(s): DDIMER in the last 72 hours. Hgb A1c: No results for input(s): HGBA1C in the last 72 hours. Lipid Profile: Recent Labs    06/12/24 0639  CHOL 150  HDL 44  LDLCALC 99  TRIG 37  CHOLHDL 3.4   Thyroid  function studies: Recent Labs    06/12/24 0639  TSH 1.036   Anemia work up: No results for input(s): VITAMINB12, FOLATE, FERRITIN, TIBC, IRON, RETICCTPCT in the last 72 hours. Sepsis Labs: Recent Labs  Lab 06/11/24 1346 06/12/24 0639 06/13/24 0842 06/14/24 0343  WBC 12.8* 10.5 9.1 13.4*   Microbiology No results found for this or any previous visit (from the past 240 hours).   Medications:    amiodarone   200 mg Oral Daily   apixaban   5 mg Oral BID   carvedilol   12.5 mg Oral BID WC   dapagliflozin  propanediol  10 mg Oral Daily   hydrALAZINE   100 mg Oral TID   isosorbide  mononitrate  60 mg Oral Daily   mexiletine  150 mg Oral Q12H   rosuvastatin   10 mg Oral Daily   sacubitril -valsartan   1 tablet Oral BID   sodium chloride  flush  3 mL Intravenous Q12H   sodium chloride  flush  3 mL Intravenous Q12H   Continuous Infusions:      LOS: 2 days   Erle Odell Castor  [facility]  06/14/2024, 7:44 AM

## 2024-06-15 ENCOUNTER — Other Ambulatory Visit (HOSPITAL_COMMUNITY): Payer: Self-pay

## 2024-06-15 DIAGNOSIS — I472 Ventricular tachycardia, unspecified: Secondary | ICD-10-CM | POA: Diagnosis not present

## 2024-06-15 LAB — CBC
HCT: 53.4 % — ABNORMAL HIGH (ref 39.0–52.0)
Hemoglobin: 17.7 g/dL — ABNORMAL HIGH (ref 13.0–17.0)
MCH: 29.4 pg (ref 26.0–34.0)
MCHC: 33.1 g/dL (ref 30.0–36.0)
MCV: 88.7 fL (ref 80.0–100.0)
Platelets: 202 K/uL (ref 150–400)
RBC: 6.02 MIL/uL — ABNORMAL HIGH (ref 4.22–5.81)
RDW: 15.4 % (ref 11.5–15.5)
WBC: 10 K/uL (ref 4.0–10.5)
nRBC: 0 % (ref 0.0–0.2)

## 2024-06-15 LAB — BASIC METABOLIC PANEL WITH GFR
Anion gap: 8 (ref 5–15)
BUN: 27 mg/dL — ABNORMAL HIGH (ref 8–23)
CO2: 22 mmol/L (ref 22–32)
Calcium: 10 mg/dL (ref 8.9–10.3)
Chloride: 110 mmol/L (ref 98–111)
Creatinine, Ser: 1.78 mg/dL — ABNORMAL HIGH (ref 0.61–1.24)
GFR, Estimated: 41 mL/min — ABNORMAL LOW (ref >60)
Glucose, Bld: 109 mg/dL — ABNORMAL HIGH (ref 70–99)
Potassium: 4.2 mmol/L (ref 3.5–5.1)
Sodium: 140 mmol/L (ref 135–145)

## 2024-06-15 MED ORDER — AMIODARONE HCL 200 MG PO TABS
200.0000 mg | ORAL_TABLET | Freq: Every day | ORAL | 0 refills | Status: DC
  Filled 2024-06-15: qty 30, 30d supply, fill #0

## 2024-06-15 MED ORDER — FUROSEMIDE 40 MG PO TABS
80.0000 mg | ORAL_TABLET | Freq: Every day | ORAL | Status: DC
  Administered 2024-06-15: 80 mg via ORAL
  Filled 2024-06-15: qty 2

## 2024-06-15 MED ORDER — SACUBITRIL-VALSARTAN 24-26 MG PO TABS
1.0000 | ORAL_TABLET | Freq: Two times a day (BID) | ORAL | 1 refills | Status: AC
  Filled 2024-06-15 (×2): qty 60, 30d supply, fill #0

## 2024-06-15 MED ORDER — ROSUVASTATIN CALCIUM 10 MG PO TABS
10.0000 mg | ORAL_TABLET | Freq: Every day | ORAL | 1 refills | Status: AC
  Filled 2024-06-15: qty 30, 30d supply, fill #0

## 2024-06-15 MED ORDER — MEXILETINE HCL 150 MG PO CAPS
150.0000 mg | ORAL_CAPSULE | Freq: Two times a day (BID) | ORAL | 1 refills | Status: DC
  Filled 2024-06-15: qty 60, 30d supply, fill #0

## 2024-06-15 NOTE — Discharge Summary (Signed)
 Physician Discharge Summary  Timothy Moon FMW:[id number] DOB: 1955/12/02 DOA: 06/11/2024  PCP: Lucius Krabbe, NP  Admit date: 06/11/2024 Discharge date: 06/15/2024  Admitted From: Home Disposition:  Home  Recommendations for Outpatient Follow-up:  Follow up with PCP in 1-2 weeks Please obtain BMP/CBC in one week   Home Health:No Equipment/Devices:None  Discharge Condition:Stable CODE STATUS:Full Diet recommendation: Heart Healthy  Brief/Interim Summary:  68 y.o. male past medical history significant for hypertension HFrEF EF of 30%, with a large aortic aneurysm 5 cm by CT on July 2023, he also missed his CT surgery appointment has not had a left heart cath due to renal disease, chronic atrial fibrillation on Eliquis  who had failed DCCV on 11/23 and this is was the last time he saw his cardiologist until 05/24/2024, tobacco abuse chronic kidney disease stage IIIa cocaine use comes in after fall, according to EMS was found in VT's in the 200s, which required cardioversion by EMS in the ED UDS was positive for cocaine, potassium 3.4 creatinine of 2.4, white blood cell count of 13 hemoglobin of 18.   Discharge Diagnoses:  Principal Problem:   Ventricular tachycardia (HCC) Active Problems:   Paroxysmal atrial fibrillation (HCC)   Fall   Leukocytosis   Acute kidney injury superimposed on chronic kidney disease (HCC)   Erythrocytosis   Hypokalemia   Hyponatremia   Essential hypertension   Polysubstance abuse (HCC)   Obesity (BMI 30-39.9)   VT (ventricular tachycardia) (HCC)  Sustained ventricular tachycardia/chronic atrial fibrillation: Patient was with amiodarone  cardiology was consulted recommended ischemic evaluation status post left and right heart cath showed nonobstructive coronary artery disease. EP was curbside and they recommended to delay ICD in the light of cocaine use. The importance of abstinence was reinforced. His amiodarone  was transition to oral he was  continued on Coreg  and mexiletine was added. His potassium was Greater than 4 magnesium greater than 2. He was started on Eliquis  which he continues in outpatient. And will follow-up with EP as an outpatient on 06/19/2024  Falls: Likely due to DTs.  Leukocytosis: And reactive and resolved.  Acute kidney injury superimposed on chronic kidney disease stage IIIb: Likely hemodynamic mediated resolved with IV fluids.  Erythrocytosis: Noted.  Hypovolemic hyponatremia: Resolved with IV fluids.  Hypokalemia/hypomagnesemia: They were repleted will try to keep potassium greater than 4 magnesium greater than 2.  HFrEF with an EF of 30%: No changes made to his medication. He was started on Entresto  which is blood pressure tolerated well we will continue it as an outpatient. He will go home on Coreg  hydralazine  and nitrates and Entresto .  Essential hypertension: Blood pressure well-controlled continue current regimen.  Cocaine abuse: He has been counseled.  Morbid obesity: Noted. Discharge Instructions  Discharge Instructions     Diet - low sodium heart healthy   Complete by: As directed    Increase activity slowly   Complete by: As directed    No wound care   Complete by: As directed       Allergies as of 06/15/2024   No Known Allergies      Medication List     TAKE these medications    amiodarone  200 MG tablet Commonly known as: PACERONE  Take 1 tablet (200 mg total) by mouth daily. What changed: See the new instructions.   apixaban  5 MG Tabs tablet Commonly known as: ELIQUIS  Take 1 tablet (5 mg total) by mouth 2 (two) times daily.   aspirin  EC 81 MG tablet Take 81-162 mg by mouth  every 4 (four) hours as needed for moderate pain (pain score 4-6). Swallow whole.   carvedilol  12.5 MG tablet Commonly known as: COREG  Take 1 tablet (12.5 mg total) by mouth 2 (two) times daily with a meal. NEEDS FOLLOW UP APPOINTMENT FOR MORE REFILLS   dapagliflozin  propanediol  10 MG Tabs tablet Commonly known as: Farxiga  Take 1 tablet (10 mg total) by mouth daily.   furosemide  40 MG tablet Commonly known as: LASIX  Take 2 tablets (80 mg total) by mouth daily.   hydrALAZINE  100 MG tablet Commonly known as: APRESOLINE  Take 1 tablet (100 mg total) by mouth 3 (three) times daily. Pt must schedule follow up visit with cardiology for further refills 3rd attempt.   isosorbide  mononitrate 60 MG 24 hr tablet Commonly known as: IMDUR  Take 1 tablet (60 mg total) by mouth daily. NEEDS FOLLOW UP APPOINTMENT FOR MORE REFILLS   mexiletine 150 MG capsule Commonly known as: MEXITIL  Take 1 capsule (150 mg total) by mouth every 12 (twelve) hours.   multivitamin capsule Take 1 capsule by mouth as needed. Centrum Silver for men   nitroGLYCERIN  0.4 MG SL tablet Commonly known as: NITROSTAT  Place 1 tablet (0.4 mg total) under the tongue every 5 (five) minutes as needed for chest pain.   potassium chloride  SA 20 MEQ tablet Commonly known as: KLOR-CON  M Take 1 tablet (20 mEq total) by mouth daily. NEEDS FOLLOW UP APPOINTMENT FOR MORE REFILLS   rosuvastatin  10 MG tablet Commonly known as: CRESTOR  Take 1 tablet (10 mg total) by mouth daily.   sacubitril -valsartan  24-26 MG Commonly known as: ENTRESTO  Take 1 tablet by mouth 2 (two) times daily.        No Known Allergies  Consultations: Cardiology   Procedures/Studies: ECHOCARDIOGRAM COMPLETE Result Date: 06/12/2024    ECHOCARDIOGRAM REPORT   Patient Name:   Timothy Moon [facility] Date of Exam: 06/12/2024 Medical Rec #:  [id number]        Height:       75.0 in Accession #:    [id number]       Weight:       251.8 lb Date of Birth:  1956/01/18       BSA:          2.420 m Patient Age:    68 years         BP:           142/92 mmHg Patient Gender: M                HR:           63 bpm. Exam Location:  Inpatient Procedure: 2D Echo, Color Doppler, Cardiac Doppler and Intracardiac            Opacification Agent (Both Spectral and  Color Flow Doppler were            utilized during procedure). Indications:    Abnormal ECG  History:        Patient has prior history of Echocardiogram examinations, most                 recent 10/19/2021. CHF, Abnormal ECG, Arrythmias:Atrial                 Fibrillation and Tachycardia; Risk Factors:Current Smoker and                 Hypertension.  Sonographer:    Juliene Rucks Referring Phys: [phone] RONDELL A SMITH IMPRESSIONS  1. Left ventricular ejection fraction, by  estimation, is 25 to 30%. The left ventricle has severely decreased function. The left ventricle demonstrates global hypokinesis. There is moderate concentric left ventricular hypertrophy. Left ventricular diastolic function could not be evaluated.  2. Right ventricular systolic function is normal. The right ventricular size is normal.  3. The mitral valve is grossly normal. Trivial mitral valve regurgitation.  4. The aortic valve is grossly normal. Aortic valve regurgitation is mild.  5. Aortic dilatation noted. There is dilatation of the aortic root, measuring 48 mm. FINDINGS  Left Ventricle: Left ventricular ejection fraction, by estimation, is 25 to 30%. The left ventricle has severely decreased function. The left ventricle demonstrates global hypokinesis. The left ventricular internal cavity size was normal in size. There is moderate concentric left ventricular hypertrophy. Left ventricular diastolic function could not be evaluated due to atrial fibrillation. Left ventricular diastolic function could not be evaluated. Right Ventricle: The right ventricular size is normal. No increase in right ventricular wall thickness. Right ventricular systolic function is normal. Left Atrium: Left atrial size was normal in size. Right Atrium: Right atrial size was normal in size. Pericardium: There is no evidence of pericardial effusion. Mitral Valve: The mitral valve is grossly normal. Trivial mitral valve regurgitation. Tricuspid Valve: The tricuspid valve  is grossly normal. Tricuspid valve regurgitation is trivial. Aortic Valve: The aortic valve is grossly normal. Aortic valve regurgitation is mild. Pulmonic Valve: The pulmonic valve was not well visualized. Pulmonic valve regurgitation is not visualized. Aorta: Aortic dilatation noted. There is dilatation of the aortic root, measuring 48 mm. IAS/Shunts: No atrial level shunt detected by color flow Doppler.   LV Volumes (MOD) LV vol d, MOD A2C: 183.0 ml LV vol d, MOD A4C: 243.0 ml LV vol s, MOD A2C: 104.0 ml LV vol s, MOD A4C: 153.0 ml LV SV MOD A2C:     79.0 ml LV SV MOD A4C:     243.0 ml LV SV MOD BP:      83.5 ml Aditya Sabharwal Electronically signed by Ria Commander Signature Date/Time: 06/12/2024/7:25:09 PM    Final    CARDIAC CATHETERIZATION Result Date: 06/12/2024   Prox LAD to Mid LAD lesion is 40% stenosed.   Prox RCA lesion is 30% stenosed.   RPAV lesion is 60% stenosed. 1.  Ectatic coronary vessels especially the right coronary artery with mild to moderate nonobstructive coronary artery disease. 2.  Left ventricular angiography was not performed.  EF was moderately severely reduced by echo. 3.  Right heart catheterization showed mildly elevated right atrial pressure, moderately elevated wedge pressure, mild pulmonary hypertension and mildly reduced cardiac output. 4.  Difficult engagement of the coronary arteries due to ascending aortic aneurysm and tortuosity of the innominate artery Recommendations: Medical therapy for nonischemic cardiomyopathy.   CT HEAD WO CONTRAST Result Date: 06/11/2024 CLINICAL DATA:  Fall, scalp laceration EXAM: CT HEAD WITHOUT CONTRAST TECHNIQUE: Contiguous axial images were obtained from the base of the skull through the vertex without intravenous contrast. RADIATION DOSE REDUCTION: This exam was performed according to the departmental dose-optimization program which includes automated exposure control, adjustment of the mA and/or kV according to patient size and/or use  of iterative reconstruction technique. 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 There is no hemorrhage. No acute ischemic changes. No mass lesion. The ventricles are normal. Skull/sinuses/orbits: No significant abnormality. 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  No acute abnormality. Electronically Signed   By: Nancyann  Heck M.D.   On: 06/11/2024 15:42   DG Chest Port 1 View Result Date: 06/11/2024 CLINICAL DATA:  Trauma.  Mechanical trip and fall. EXAM: PORTABLE CHEST 1 VIEW COMPARISON:  CT chest 06/05/2024 FINDINGS: Shallow inspiration. Mild cardiac enlargement. No vascular congestion, edema, or consolidation. No pleural effusion or pneumothorax. Mediastinal contours appear intact. IMPRESSION: No active disease. Electronically Signed   By: Elsie Gravely M.D.   On: 06/11/2024 15:38   CT CHEST WO CONTRAST Result Date: 06/05/2024 CLINICAL DATA:  Aortic aneurysm suspected EXAM: CT CHEST WITHOUT CONTRAST TECHNIQUE: Multidetector CT imaging of the chest was performed following the standard protocol without IV contrast. RADIATION DOSE REDUCTION: This exam was performed according to the departmental dose-optimization program which includes automated exposure control, adjustment of the mA and/or kV according to patient size and/or use of iterative reconstruction technique. COMPARISON:  May 13, 2022 FINDINGS: Cardiovascular: Mild cardiomegaly. No pericardial effusion. Diffuse multi-vessel coronary atherosclerosis. Unchanged fusiform aneurysm of the ascending aorta measuring 4.9 cm (previously, 4.8 cm). Diffuse aortic atherosclerosis. Mediastinum/Nodes: No mediastinal mass.Unchanged 1.6 cm nodule in the left thyroid  lobe.No mediastinal, hilar, or axillary lymphadenopathy. Lungs/Pleura: The midline trachea and bronchi are patent. Mild centrilobular emphysema. No focal airspace consolidation, pleural effusion, or pneumothorax. Multifocal  fibrolinear scarring and subsegmental atelectasis in the lung bases. 4 mm right lower lobe nodule, which in retrospect, is unchanged, likely benign. Musculoskeletal: No acute fracture or destructive bone lesion. Mild bilateral glenohumeral joint osteoarthritis. Multilevel degenerative disc disease of the spine. Thoracic DISH. Upper Abdomen: Decreased size of the spleen with interval development of a multinodular contour. Similar appearance of a couple of subcentimeter hypodensities in the right and left hepatic lobes. IMPRESSION: 1. Similar fusiform aneurysm of the ascending aorta, measuring 4.9 cm. Continued follow-up should be considered as documented below. 2. Mild centrilobular emphysema. No pneumonia, pulmonary edema, or pleural effusion. 3. Decreased size of the spleen with interval development of a multinodular contour. While this could represent interval scarring, a nonemergent multiphase abdominal MRI with IV contrast is recommended to exclude underlying splenic masses, such as in metastatic disease or lymphoma. Ascending thoracic aortic aneurysm. Recommend semi-annual imaging followup by CTA or MRA and referral to cardiothoracic surgery if not already obtained. This recommendation follows 2010 ACCF/AHA/AATS/ACR/ASA/SCA/SCAI/SIR/STS/SVM Guidelines for the Diagnosis and Management of Patients With Thoracic Aortic Disease. Circulation. 2010; 121: Z733-z630. Aortic aneurysm NOS (ICD10-I71.9) Aortic Atherosclerosis (ICD10-I70.0). These results will be called to the ordering clinician or representative by the Radiologist Assistant and communication documented in the PACS or [employer]. Electronically Signed   By: Rogelia Myers M.D.   On: 06/05/2024 10:35     Subjective: No complaints  Discharge Exam: Vitals:   06/15/24 0644 06/15/24 0746  BP:  (!) 156/104  Pulse: 60   Resp:  20  Temp:  97.8 F (36.6 C)  SpO2: 95%    Vitals:   06/14/24 2341 06/15/24 0404 06/15/24 0644 06/15/24 0746  BP:  (!) 144/106 (!) 156/104  (!) 156/104  Pulse: 65 75 60   Resp: 20 19  20   Temp: 97.7 F (36.5 C) 97.9 F (36.6 C)  97.8 F (36.6 C)  TempSrc: Oral Oral  Oral  SpO2: 97% 97% 95%   Weight:   111.9 kg   Height:        General: Pt is alert, awake, not in acute distress Cardiovascular: RRR, S1/S2 +, no rubs, no gallops Respiratory: CTA bilaterally, no wheezing, no rhonchi Abdominal: Soft, NT, ND, bowel sounds + Extremities: no edema,  no cyanosis    The results of significant diagnostics from this hospitalization (including imaging, microbiology, ancillary and laboratory) are listed below for reference.     Microbiology: No results found for this or any previous visit (from the past 240 hours).   Labs: BNP (last 3 results) Recent Labs    04/17/24 0030  BNP 759.8*   Basic Metabolic Panel: Recent Labs  Lab 06/11/24 1346 06/12/24 0639 06/12/24 1254 06/12/24 1300 06/13/24 0842 06/14/24 0343 06/15/24 0352  NA 131* 139 143 143 141 140 140  K 3.4* 4.6 3.6 3.6 3.8 4.3 4.2  CL 99 103  --   --  108 110 110  CO2 20* 26  --   --  22 21* 22  GLUCOSE 120* 90  --   --  125* 110* 109*  BUN 39* 36*  --   --  34* 30* 27*  CREATININE 2.48* 2.22*  --   --  1.80* 2.04* 1.78*  CALCIUM  9.7 9.8  --   --  9.5 9.8 10.0  MG 2.4 2.5*  --   --   --   --   --    Liver Function Tests: Recent Labs  Lab 06/11/24 1346  AST 46*  ALT 33  ALKPHOS 86  BILITOT 1.8*  PROT 8.0  ALBUMIN 4.0   No results for input(s): LIPASE, AMYLASE in the last 168 hours. No results for input(s): AMMONIA in the last 168 hours. CBC: Recent Labs  Lab 06/11/24 1346 06/12/24 0639 06/12/24 1254 06/12/24 1300 06/13/24 0842 06/14/24 0343 06/15/24 0352  WBC 12.8* 10.5  --   --  9.1 13.4* 10.0  NEUTROABS 10.3*  --   --   --   --   --   --   HGB 18.1* 17.3* 16.3 16.3 16.7 16.9 17.7*  HCT 54.2* 52.7* 48.0 48.0 49.8 51.1 53.4*  MCV 87.8 88.3  --   --  88.1 88.6 88.7  PLT 220 195  --   --  185 199 202    Cardiac Enzymes: Recent Labs  Lab 06/11/24 1621  CKTOTAL 738*   BNP: Invalid input(s): POCBNP CBG: No results for input(s): GLUCAP in the last 168 hours. D-Dimer No results for input(s): DDIMER in the last 72 hours. Hgb A1c No results for input(s): HGBA1C in the last 72 hours. Lipid Profile No results for input(s): CHOL, HDL, LDLCALC, TRIG, CHOLHDL, LDLDIRECT in the last 72 hours. Thyroid  function studies No results for input(s): TSH, T4TOTAL, T3FREE, THYROIDAB in the last 72 hours.  Invalid input(s): FREET3 Anemia work up No results for input(s): VITAMINB12, FOLATE, FERRITIN, TIBC, IRON, RETICCTPCT in the last 72 hours. Urinalysis    Component Value Date/Time   COLORURINE COLORLESS (A) 10/19/2021 0300   APPEARANCEUR CLEAR 10/19/2021 0300   LABSPEC 1.004 (L) 10/19/2021 0300   PHURINE 7.0 10/19/2021 0300   GLUCOSEU NEGATIVE 10/19/2021 0300   HGBUR NEGATIVE 10/19/2021 0300   BILIRUBINUR NEGATIVE 10/19/2021 0300   KETONESUR NEGATIVE 10/19/2021 0300   PROTEINUR NEGATIVE 10/19/2021 0300   NITRITE NEGATIVE 10/19/2021 0300   LEUKOCYTESUR NEGATIVE 10/19/2021 0300   Sepsis Labs Recent Labs  Lab 06/12/24 0639 06/13/24 0842 06/14/24 0343 06/15/24 0352  WBC 10.5 9.1 13.4* 10.0   Microbiology No results found for this or any previous visit (from the past 240 hours).   Time coordinating discharge: Over 35 minutes  SIGNED:   Erle Odell Castor, MD  [facility] 06/15/2024, 8:27 AM Pager   If 7PM-7AM, please contact night-coverage  [email or web address] Password [email or web address]

## 2024-06-15 NOTE — Progress Notes (Signed)
 DC order noted per MD. DC RN at bedside. Reviewing patients chart, BP this am prior to am meds 156/104. After NT recheck after am meds, BP 147/107. MD Hezzie Castor informed via secure chat, states patient okay to dc after daily dose of lasix , no need to recheck after, patient cleared to dc home. Primary RN administered lasix  dose. AVS printed/reviewed. PIV removed. Skin intact. All belongings accounted for. TOC meds delivered to the patient. Patient taken down to the dc lounge.

## 2024-06-18 ENCOUNTER — Telehealth: Payer: Self-pay | Admitting: *Deleted

## 2024-06-18 ENCOUNTER — Other Ambulatory Visit (HOSPITAL_COMMUNITY): Payer: Self-pay | Admitting: Internal Medicine

## 2024-06-18 ENCOUNTER — Other Ambulatory Visit (HOSPITAL_COMMUNITY): Payer: Self-pay

## 2024-06-18 ENCOUNTER — Other Ambulatory Visit: Payer: Self-pay

## 2024-06-18 NOTE — Progress Notes (Signed)
 Pt called for pre procedure instructions. Arrival time 1000 NPO after midnight explained Instructed to take am meds with sip of water and confirmed blood thinner consistency, Eliquis  Instructed pt need for ride home tomorrow and have responsible adult with them for 24 hrs post procedure.

## 2024-06-18 NOTE — Transitions of Care (Post Inpatient/ED Visit) (Signed)
 06/18/2024  Name: Timothy Moon MRN: [id number] DOB: 10-06-1956  Today's TOC FU Call Status: Today's TOC FU Call Status:: Successful TOC FU Call Completed TOC FU Call Complete Date: 06/18/24 Patient's Name and Date of Birth confirmed.  Transition Care Management Follow-up Telephone Call Date of Discharge: 06/15/24 Discharge Facility: [facility]) Type of Discharge: Inpatient Admission Primary Inpatient Discharge Diagnosis:: Ventricular tachycardia How have you been since you were released from the hospital?:  (appetite good, no issues with bowel/ bladder, independent, friend lives with pt, she is a Engineer, civil (consulting)) Any questions or concerns?: No  Items Reviewed: Did you receive and understand the discharge instructions provided?: Yes Medications obtained,verified, and reconciled?: Yes (Medications Reviewed) Any new allergies since your discharge?: No Dietary orders reviewed?: Yes Type of Diet Ordered:: heart healthy,  low sodium Do you have support at home?: Yes People in Home [RPT]: friend(s) Name of Support/Comfort Primary Source: does not share name of friend that lives with pt Reviewed HF action plan, encouraged pt to start weighing daily (pt weighs every now and then) Reviewed signs/ symptoms of V-tach, abstain from substances  Medications Reviewed Today: Medications Reviewed Today     Reviewed by Aura Mliss LABOR, RN (Registered Nurse) on 06/18/24 at 1152  Med List Status: <None>   Medication Order Taking? Sig Documenting Provider Last Dose Status Informant  amiodarone  (PACERONE ) 200 MG tablet [id number] Yes Take 1 tablet (200 mg total) by mouth daily. Odell Celinda Balo, MD  Active   apixaban  (ELIQUIS ) 5 MG TABS tablet [id number] Yes Take 1 tablet (5 mg total) by mouth 2 (two) times daily. Fenton, Clint R, PA  Active Self, Pharmacy Records  aspirin  EC 81 MG tablet [id number] Yes Take 81-162 mg by mouth every 4 (four) hours as needed for moderate pain (pain score 4-6). Swallow  whole. [provider]  Active Self, Pharmacy Records  carvedilol  (COREG ) 12.5 MG tablet [id number] Yes Take 1 tablet (12.5 mg total) by mouth 2 (two) times daily with a meal. NEEDS FOLLOW UP APPOINTMENT FOR MORE REFILLS Fenton, Clint R, PA  Active Self, Pharmacy Records  dapagliflozin  propanediol (FARXIGA ) 10 MG TABS tablet [id number] Yes Take 1 tablet (10 mg total) by mouth daily. Bensimhon, Toribio SAUNDERS, MD  Active Self, Pharmacy Records           Med Note JACKOLYN WADDELL DEL   Wed Apr 17, 2024  8:10 AM) Gets through mail order. Dispenses may not show.   furosemide  (LASIX ) 40 MG tablet [id number] Yes Take 2 tablets (80 mg total) by mouth daily. Fenton, [place] R, PA  Active Self, Pharmacy Records  hydrALAZINE  (APRESOLINE ) 100 MG tablet [id number] Yes Take 1 tablet (100 mg total) by mouth 3 (three) times daily. Pt must schedule follow up visit with cardiology for further refills 3rd attempt. Fenton, Clint R, PA  Active Self, Pharmacy Records  isosorbide  mononitrate (IMDUR ) 60 MG 24 hr tablet [id number] Yes Take 1 tablet (60 mg total) by mouth daily. NEEDS FOLLOW UP APPOINTMENT FOR MORE REFILLS Fenton, Clint R, PA  Active Self, Pharmacy Records  mexiletine (MEXITIL ) 150 MG capsule [id number] Yes Take 1 capsule (150 mg total) by mouth every 12 (twelve) hours. Odell Celinda Balo, MD  Active   Multiple Vitamin (MULTIVITAMIN) capsule [id number] Yes Take 1 capsule by mouth as needed. Centrum Silver for men Lucius Krabbe, NP  Active Self, Pharmacy Records  nitroGLYCERIN  (NITROSTAT ) 0.4 MG SL tablet [id number] Yes Place 1 tablet (0.4 mg total) under the tongue every 5 (five) minutes  as needed for chest pain. Leotis Bogus, MD  Active Self, Pharmacy Records           Med Note [facility], SEBASTIAN   Tue Jun 11, 2024  6:58 PM) PRN, no recent need/use  potassium chloride  SA (KLOR-CON  M) 20 MEQ tablet [id number] Yes Take 1 tablet (20 mEq total) by mouth daily. NEEDS FOLLOW UP APPOINTMENT FOR MORE REFILLS Bensimhon,  Toribio SAUNDERS, MD  Active Self, Pharmacy Records  rosuvastatin  (CRESTOR ) 10 MG tablet [id number] Yes Take 1 tablet (10 mg total) by mouth daily. Odell Celinda Balo, MD  Active   sacubitril -valsartan  (ENTRESTO ) 24-26 MG [id number] Yes Take 1 tablet by mouth 2 (two) times daily. Odell Celinda Balo, MD  Active             Home Care and Equipment/Supplies: Were Home Health Services Ordered?: No Any new equipment or medical supplies ordered?: No  Functional Questionnaire: Do you need assistance with bathing/showering or dressing?: No Do you need assistance with meal preparation?: No Do you need assistance with eating?: No Do you have difficulty maintaining continence: No Do you need assistance with getting out of bed/getting out of a chair/moving?: No Do you have difficulty managing or taking your medications?: No  Follow up appointments reviewed: PCP Follow-up appointment confirmed?: Yes (collaborated with care guide and scheduled post hospital follow up) Date of PCP follow-up appointment?: 06/24/24 Follow-up Provider: Corean Comment NP  @ 3 pm Specialist Hospital Follow-up appointment confirmed?: Yes Date of Specialist follow-up appointment?: 07/01/24 Follow-Up Specialty Provider:: cardiologist Dr. Lesia    9/19  HF- Dr. Bensimhon Do you need transportation to your follow-up appointment?: No Do you understand care options if your condition(s) worsen?: Yes-patient verbalized understanding  SDOH Interventions Today    Flowsheet Row Most Recent Value  SDOH Interventions   Food Insecurity Interventions Intervention Not Indicated  Housing Interventions Intervention Not Indicated  Transportation Interventions Intervention Not Indicated  Utilities Interventions Intervention Not Indicated   Mliss [facility], BSN RN Care Manager/ Transition of Care [place]/ [facility] Population Health [phone]

## 2024-06-19 ENCOUNTER — Other Ambulatory Visit: Payer: Self-pay

## 2024-06-19 ENCOUNTER — Ambulatory Visit (HOSPITAL_COMMUNITY)
Admission: RE | Admit: 2024-06-19 | Discharge: 2024-06-19 | Disposition: A | Discharge status: Home / Self Care | Attending: Cardiology | Admitting: Cardiology

## 2024-06-19 ENCOUNTER — Encounter (HOSPITAL_COMMUNITY): Payer: Self-pay | Admitting: Cardiology

## 2024-06-19 ENCOUNTER — Ambulatory Visit (HOSPITAL_COMMUNITY)

## 2024-06-19 ENCOUNTER — Encounter (HOSPITAL_COMMUNITY)
Admission: RE | Disposition: A | Discharge status: Home / Self Care | Payer: Self-pay | Source: Home / Self Care | Attending: Cardiology

## 2024-06-19 DIAGNOSIS — I4819 Other persistent atrial fibrillation: Secondary | ICD-10-CM | POA: Diagnosis present

## 2024-06-19 DIAGNOSIS — I714 Abdominal aortic aneurysm, without rupture, unspecified: Secondary | ICD-10-CM | POA: Insufficient documentation

## 2024-06-19 DIAGNOSIS — Z7901 Long term (current) use of anticoagulants: Secondary | ICD-10-CM | POA: Insufficient documentation

## 2024-06-19 DIAGNOSIS — I5022 Chronic systolic (congestive) heart failure: Secondary | ICD-10-CM | POA: Diagnosis not present

## 2024-06-19 DIAGNOSIS — I130 Hypertensive heart and chronic kidney disease with heart failure and stage 1 through stage 4 chronic kidney disease, or unspecified chronic kidney disease: Secondary | ICD-10-CM | POA: Diagnosis not present

## 2024-06-19 DIAGNOSIS — I110 Hypertensive heart disease with heart failure: Secondary | ICD-10-CM | POA: Diagnosis not present

## 2024-06-19 DIAGNOSIS — I428 Other cardiomyopathies: Secondary | ICD-10-CM | POA: Diagnosis not present

## 2024-06-19 DIAGNOSIS — I739 Peripheral vascular disease, unspecified: Secondary | ICD-10-CM | POA: Insufficient documentation

## 2024-06-19 DIAGNOSIS — Z87891 Personal history of nicotine dependence: Secondary | ICD-10-CM | POA: Insufficient documentation

## 2024-06-19 DIAGNOSIS — Z79899 Other long term (current) drug therapy: Secondary | ICD-10-CM | POA: Insufficient documentation

## 2024-06-19 DIAGNOSIS — N1832 Chronic kidney disease, stage 3b: Secondary | ICD-10-CM

## 2024-06-19 DIAGNOSIS — F1490 Cocaine use, unspecified, uncomplicated: Secondary | ICD-10-CM | POA: Insufficient documentation

## 2024-06-19 DIAGNOSIS — I480 Paroxysmal atrial fibrillation: Secondary | ICD-10-CM

## 2024-06-19 DIAGNOSIS — I472 Ventricular tachycardia, unspecified: Secondary | ICD-10-CM | POA: Diagnosis not present

## 2024-06-19 DIAGNOSIS — I4891 Unspecified atrial fibrillation: Secondary | ICD-10-CM

## 2024-06-19 HISTORY — PX: CARDIOVERSION: EP1203

## 2024-06-19 SURGERY — CARDIOVERSION (CATH LAB)
Anesthesia: General

## 2024-06-19 MED ORDER — PROPOFOL 10 MG/ML IV BOLUS
INTRAVENOUS | Status: DC | PRN
  Administered 2024-06-19: 20 mg via INTRAVENOUS
  Administered 2024-06-19: 60 mg via INTRAVENOUS

## 2024-06-19 MED ORDER — LIDOCAINE 2% (20 MG/ML) 5 ML SYRINGE
INTRAMUSCULAR | Status: DC | PRN
  Administered 2024-06-19: 60 mg via INTRAVENOUS

## 2024-06-19 SURGICAL SUPPLY — 1 items: PAD DEFIB RADIO PHYSIO CONN (PAD) ×1 IMPLANT

## 2024-06-19 NOTE — Transfer of Care (Signed)
 Immediate Anesthesia Transfer of Care Note  Patient: Timothy Moon  Procedure(s) Performed: CARDIOVERSION  Patient Location: PACU and Cath Lab  Anesthesia Type:General  Level of Consciousness: awake and sedated  Airway & Oxygen Therapy: Patient Spontanous Breathing and Patient connected to face mask oxygen  Post-op Assessment: Report given to RN and Post -op Vital signs reviewed and stable  Post vital signs: stable  Last Vitals:  Vitals Value Taken Time  BP 152/108 06/19/24 11:45  Temp    Pulse 72 06/19/24 11:46  Resp 22 06/19/24 11:46  SpO2 96 % 06/19/24 11:46  Vitals shown include unfiled device data.  Last Pain:  Vitals:   06/19/24 1045  TempSrc:   PainSc: 0-No pain         Complications: No notable events documented.

## 2024-06-19 NOTE — CV Procedure (Signed)
    Electrical Cardioversion Procedure Note Timothy Moon [id number] [birth year]  Procedure: Electrical Cardioversion Indications:  Atrial Fibrillation  Time Out: Verified patient identification, verified procedure,medications/allergies/relevent history reviewed, required imaging and test results available.  Performed  Procedure Details  The patient was NPO after midnight. Anesthesia was administered at the beside  by Dr.Turk with  propofol .  Cardioversion was performed with synchronized biphasic defibrillation via AP pads with 200 joules.  1 attempt(s) were performed.  The patient converted to normal sinus rhythm. The patient tolerated the procedure well   IMPRESSION:  Successful cardioversion of atrial fibrillation    Timothy Moon 06/19/2024, 11:58 AM

## 2024-06-19 NOTE — Anesthesia Postprocedure Evaluation (Signed)
 Anesthesia Post Note  Patient: Timothy Moon  Procedure(s) Performed: CARDIOVERSION     Patient location during evaluation: Cath Lab Anesthesia Type: General Level of consciousness: awake and alert Pain management: pain level controlled Vital Signs Assessment: post-procedure vital signs reviewed and stable Respiratory status: spontaneous breathing, nonlabored ventilation, respiratory function stable and patient connected to nasal cannula oxygen Cardiovascular status: blood pressure returned to baseline and stable Postop Assessment: no apparent nausea or vomiting Anesthetic complications: no   No notable events documented.  Last Vitals:  Vitals:   06/19/24 1205 06/19/24 1210  BP: (!) 133/90 (!) 137/92  Pulse: 65 65  Resp: 16 16  Temp:    SpO2: 98% 98%    Last Pain:  Vitals:   06/19/24 1045  TempSrc:   PainSc: 0-No pain                 Garnette FORBES Skillern

## 2024-06-19 NOTE — Anesthesia Preprocedure Evaluation (Addendum)
 Anesthesia Evaluation  Patient identified by MRN, date of birth, ID band Patient awake    Reviewed: Allergy & Precautions, NPO status , Patient's Chart, lab work & pertinent test results  Airway Mallampati: II  TM Distance: >3 FB Neck ROM: Full    Dental  (+) Dental Advisory Given, Poor Dentition   Pulmonary former smoker   Pulmonary exam normal breath sounds clear to auscultation       Cardiovascular hypertension, (-) angina + Peripheral Vascular Disease (AAA) and +CHF  + dysrhythmias Atrial Fibrillation and Ventricular Tachycardia  Rhythm:Irregular Rate:Abnormal  HFrEF EF of 30%, with a large aortic aneurysm 5 cm by CT on July 2023   Neuro/Psych negative neurological ROS     GI/Hepatic negative GI ROS,,,(+)     substance abuse (last use 1 month ago)  cocaine use  Endo/Other  Obesity   Renal/GU Renal InsufficiencyRenal disease     Musculoskeletal negative musculoskeletal ROS (+)    Abdominal   Peds  Hematology  (+) Blood dyscrasia (Eliquis )   Anesthesia Other Findings Day of surgery medications reviewed with the patient.  Reproductive/Obstetrics                              Anesthesia Physical Anesthesia Plan  ASA: 4  Anesthesia Plan: General   Post-op Pain Management: Minimal or no pain anticipated   Induction: Intravenous  PONV Risk Score and Plan: 2 and TIVA  Airway Management Planned: Natural Airway and Simple Face Mask  Additional Equipment:   Intra-op Plan:   Post-operative Plan:   Informed Consent: I have reviewed the patients History and Physical, chart, labs and discussed the procedure including the risks, benefits and alternatives for the proposed anesthesia with the patient or authorized representative who has indicated his/her understanding and acceptance.     Dental advisory given  Plan Discussed with: CRNA  Anesthesia Plan Comments:           Anesthesia Quick Evaluation

## 2024-06-19 NOTE — Interval H&P Note (Signed)
 History and Physical Interval Note:  06/19/2024 10:30 AM  Timothy Moon  has presented today for surgery, with the diagnosis of AFIB.  The various methods of treatment have been discussed with the patient and family. After consideration of risks, benefits and other options for treatment, the patient has consented to  Procedure(s): CARDIOVERSION (N/A) as a surgical intervention.  The patient's history has been reviewed, patient examined, no change in status, stable for surgery.  I have reviewed the patient's chart and labs.  Questions were answered to the patient's satisfaction.     [employer]

## 2024-06-24 ENCOUNTER — Other Ambulatory Visit (HOSPITAL_COMMUNITY): Payer: Self-pay

## 2024-06-24 ENCOUNTER — Encounter: Payer: Self-pay | Admitting: Family

## 2024-06-24 ENCOUNTER — Ambulatory Visit (INDEPENDENT_AMBULATORY_CARE_PROVIDER_SITE_OTHER): Admitting: Family

## 2024-06-24 VITALS — BP 127/79 | HR 53 | Temp 97.5°F | Ht 75.0 in | Wt 249.6 lb

## 2024-06-24 DIAGNOSIS — N189 Chronic kidney disease, unspecified: Secondary | ICD-10-CM

## 2024-06-24 DIAGNOSIS — N179 Acute kidney failure, unspecified: Secondary | ICD-10-CM | POA: Diagnosis not present

## 2024-06-24 DIAGNOSIS — I25118 Atherosclerotic heart disease of native coronary artery with other forms of angina pectoris: Secondary | ICD-10-CM

## 2024-06-24 DIAGNOSIS — I10 Essential (primary) hypertension: Secondary | ICD-10-CM | POA: Diagnosis not present

## 2024-06-24 DIAGNOSIS — I5022 Chronic systolic (congestive) heart failure: Secondary | ICD-10-CM | POA: Diagnosis not present

## 2024-06-24 DIAGNOSIS — E669 Obesity, unspecified: Secondary | ICD-10-CM

## 2024-06-24 DIAGNOSIS — I5021 Acute systolic (congestive) heart failure: Secondary | ICD-10-CM | POA: Diagnosis not present

## 2024-06-24 DIAGNOSIS — I480 Paroxysmal atrial fibrillation: Secondary | ICD-10-CM

## 2024-06-24 MED ORDER — HYDRALAZINE HCL 100 MG PO TABS
100.0000 mg | ORAL_TABLET | Freq: Three times a day (TID) | ORAL | 0 refills | Status: DC
  Filled 2024-06-24: qty 90, 30d supply, fill #0

## 2024-06-24 NOTE — Progress Notes (Unsigned)
 Patient ID: Timothy Moon, male    DOB: January 22, 1956, 68 y.o.   MRN: [id number]  Chief Complaint  Patient presents with  . Follow-up    Pt was seen in ED on 8/26 and discharged on 9/3.    hosp 8/26-8/30 for CHF w/VT then back to ED on 9/3 & had cardioversion w/Skains  A-fib Ozell prentice Passey at [facility] on 9/15 sees Dr Kerrin regarding for aortic aneurysm on 9/30 9/24 - PA with A-fib office f/u for cardioversion Dr Bensimhon - 9/19  heart cath found   Assessment & Plan:   Subjective:    Outpatient Medications Prior to Visit  Medication Sig Dispense Refill  . amiodarone  (PACERONE ) 200 MG tablet Take 1 tablet (200 mg total) by mouth daily. 30 tablet 0  . apixaban  (ELIQUIS ) 5 MG TABS tablet Take 1 tablet (5 mg total) by mouth 2 (two) times daily. 60 tablet 3  . aspirin  EC 81 MG tablet Take 81-162 mg by mouth every 4 (four) hours as needed for moderate pain (pain score 4-6). Swallow whole.    . carvedilol  (COREG ) 12.5 MG tablet Take 1 tablet (12.5 mg total) by mouth 2 (two) times daily with a meal. NEEDS FOLLOW UP APPOINTMENT FOR MORE REFILLS 90 tablet 1  . dapagliflozin  propanediol (FARXIGA ) 10 MG TABS tablet Take 1 tablet (10 mg total) by mouth daily. 30 tablet 0  . furosemide  (LASIX ) 40 MG tablet Take 2 tablets (80 mg total) by mouth daily. 120 tablet 0  . hydrALAZINE  (APRESOLINE ) 100 MG tablet Take 1 tablet (100 mg total) by mouth 3 (three) times daily. Pt must schedule follow up visit with cardiology for further refills 3rd attempt. 45 tablet 0  . isosorbide  mononitrate (IMDUR ) 60 MG 24 hr tablet Take 1 tablet (60 mg total) by mouth daily. NEEDS FOLLOW UP APPOINTMENT FOR MORE REFILLS 90 tablet 0  . Multiple Vitamin (MULTIVITAMIN) capsule Take 1 capsule by mouth as needed. Centrum Silver for men 90 capsule 0  . nitroGLYCERIN  (NITROSTAT ) 0.4 MG SL tablet Place 1 tablet (0.4 mg total) under the tongue every 5 (five) minutes as needed for chest pain. 30 tablet 12  . potassium  chloride SA (KLOR-CON  M) 20 MEQ tablet Take 1 tablet (20 mEq total) by mouth daily. NEEDS FOLLOW UP APPOINTMENT FOR MORE REFILLS 90 tablet 0  . rosuvastatin  (CRESTOR ) 10 MG tablet Take 1 tablet (10 mg total) by mouth daily. 30 tablet 1  . sacubitril -valsartan  (ENTRESTO ) 24-26 MG Take 1 tablet by mouth 2 (two) times daily. 60 tablet 1  . mexiletine (MEXITIL ) 150 MG capsule Take 1 capsule (150 mg total) by mouth every 12 (twelve) hours. 60 capsule 1   No facility-administered medications prior to visit.   Past Medical History:  Diagnosis Date  . Acute congestive heart failure (HCC) 10/19/2021  . Ascending aortic aneurysm (HCC) 11/23/2021   4.9 cm in January 2023  . Chronic kidney disease, stage 3a (HCC) 10/19/2021  . Cocaine abuse (HCC) 10/19/2021  . Elevated troponin level not due myocardial infarction 10/19/2021  . Essential hypertension 10/19/2021  . Nicotine dependence, cigarettes, uncomplicated 10/19/2021   Past Surgical History:  Procedure Laterality Date  . CARDIOVERSION N/A 09/07/2022   Procedure: CARDIOVERSION;  Surgeon: Hobart Powell BRAVO, MD;  Location[facility] ENDOSCOPY;  Service: Cardiovascular;  Laterality: N/A;  . CARDIOVERSION N/A 06/19/2024   Procedure: CARDIOVERSION;  Surgeon: Jeffrie Oneil BROCKS, MD;  Location: [facility] INVASIVE CV LAB;  Service: Cardiovascular;  Laterality: N/A;  . RIGHT/LEFT HEART  CATH AND CORONARY ANGIOGRAPHY N/A 06/12/2024   Procedure: RIGHT/LEFT HEART CATH AND CORONARY ANGIOGRAPHY;  Surgeon: Darron Deatrice LABOR, MD;  Location: [facility] INVASIVE CV LAB;  Service: Cardiovascular;  Laterality: N/A;   No Known Allergies    Objective:    Physical Exam Vitals and nursing note reviewed.  Constitutional:      General: He is not in acute distress.    Appearance: Normal appearance.  HENT:     Head: Normocephalic.  Cardiovascular:     Rate and Rhythm: Normal rate and regular rhythm.  Pulmonary:     Effort: Pulmonary effort is normal.     Breath sounds: Normal breath sounds.   Musculoskeletal:        General: Normal range of motion.     Cervical back: Normal range of motion.  Skin:    General: Skin is warm and dry.  Neurological:     Mental Status: He is alert and oriented to person, place, and time.  Psychiatric:        Mood and Affect: Mood normal.    BP 127/79 (BP Location: Left Arm, Patient Position: Sitting, Cuff Size: Normal)   Pulse (!) 53   Temp (!) 97.5 F (36.4 C) (Temporal)   Ht 6' 3 (1.905 m)   Wt 249 lb 9.6 oz (113.2 kg)   SpO2 95%   BMI 31.20 kg/m  Wt Readings from Last 3 Encounters:  06/24/24 249 lb 9.6 oz (113.2 kg)  06/19/24 247 lb (112 kg)  06/15/24 246 lb 11.1 oz (111.9 kg)      Timothy Krabbe, NP

## 2024-06-25 ENCOUNTER — Encounter: Payer: Self-pay | Admitting: Family

## 2024-06-25 DIAGNOSIS — I25118 Atherosclerotic heart disease of native coronary artery with other forms of angina pectoris: Secondary | ICD-10-CM | POA: Insufficient documentation

## 2024-06-25 LAB — CBC WITH DIFFERENTIAL/PLATELET
Basophils Absolute: 0.1 K/uL (ref 0.0–0.1)
Basophils Relative: 1.2 % (ref 0.0–3.0)
Eosinophils Absolute: 0.5 K/uL (ref 0.0–0.7)
Eosinophils Relative: 4.9 % (ref 0.0–5.0)
HCT: 53.5 % — ABNORMAL HIGH (ref 39.0–52.0)
Hemoglobin: 17.5 g/dL — ABNORMAL HIGH (ref 13.0–17.0)
Lymphocytes Relative: 15.8 % (ref 12.0–46.0)
Lymphs Abs: 1.6 K/uL (ref 0.7–4.0)
MCHC: 32.7 g/dL (ref 30.0–36.0)
MCV: 90.3 fl (ref 78.0–100.0)
Monocytes Absolute: 0.7 K/uL (ref 0.1–1.0)
Monocytes Relative: 7.1 % (ref 3.0–12.0)
Neutro Abs: 7.2 K/uL (ref 1.4–7.7)
Neutrophils Relative %: 71 % (ref 43.0–77.0)
Platelets: 212 K/uL (ref 150.0–400.0)
RBC: 5.93 Mil/uL — ABNORMAL HIGH (ref 4.22–5.81)
RDW: 16.2 % — ABNORMAL HIGH (ref 11.5–15.5)
WBC: 10.1 K/uL (ref 4.0–10.5)

## 2024-06-25 LAB — BASIC METABOLIC PANEL WITH GFR
BUN: 33 mg/dL — ABNORMAL HIGH (ref 6–23)
CO2: 23 meq/L (ref 19–32)
Calcium: 9.6 mg/dL (ref 8.4–10.5)
Chloride: 107 meq/L (ref 96–112)
Creatinine, Ser: 2.04 mg/dL — ABNORMAL HIGH (ref 0.40–1.50)
GFR: 33.02 mL/min — ABNORMAL LOW (ref >60.00)
Glucose, Bld: 98 mg/dL (ref 70–99)
Potassium: 3.9 meq/L (ref 3.5–5.1)
Sodium: 140 meq/L (ref 135–145)

## 2024-06-26 ENCOUNTER — Ambulatory Visit: Payer: Self-pay | Admitting: Family

## 2024-07-01 ENCOUNTER — Ambulatory Visit: Attending: Student | Admitting: Student

## 2024-07-01 ENCOUNTER — Other Ambulatory Visit (HOSPITAL_COMMUNITY): Payer: Self-pay

## 2024-07-01 ENCOUNTER — Encounter: Payer: Self-pay | Admitting: Student

## 2024-07-01 VITALS — BP 124/78 | HR 53 | Ht 75.0 in | Wt 253.0 lb

## 2024-07-01 DIAGNOSIS — I472 Ventricular tachycardia, unspecified: Secondary | ICD-10-CM | POA: Diagnosis not present

## 2024-07-01 DIAGNOSIS — Z5181 Encounter for therapeutic drug level monitoring: Secondary | ICD-10-CM | POA: Diagnosis not present

## 2024-07-01 DIAGNOSIS — Z9189 Other specified personal risk factors, not elsewhere classified: Secondary | ICD-10-CM | POA: Insufficient documentation

## 2024-07-01 DIAGNOSIS — I480 Paroxysmal atrial fibrillation: Secondary | ICD-10-CM | POA: Diagnosis not present

## 2024-07-01 DIAGNOSIS — Z79899 Other long term (current) drug therapy: Secondary | ICD-10-CM | POA: Diagnosis not present

## 2024-07-01 MED ORDER — MEXILETINE HCL 150 MG PO CAPS
150.0000 mg | ORAL_CAPSULE | Freq: Two times a day (BID) | ORAL | 6 refills | Status: AC
  Filled 2024-07-01 – 2024-07-12 (×2): qty 60, 30d supply, fill #0
  Filled 2024-08-15: qty 60, 30d supply, fill #1
  Filled 2024-09-17: qty 60, 30d supply, fill #2
  Filled 2024-10-16: qty 60, 30d supply, fill #3
  Filled 2024-11-20 (×2): qty 60, 30d supply, fill #4

## 2024-07-01 MED ORDER — AMIODARONE HCL 200 MG PO TABS
200.0000 mg | ORAL_TABLET | Freq: Every day | ORAL | 6 refills | Status: AC
  Filled 2024-07-01 – 2024-07-12 (×2): qty 30, 30d supply, fill #0
  Filled 2024-08-15: qty 30, 30d supply, fill #1
  Filled 2024-09-17: qty 30, 30d supply, fill #2
  Filled 2024-10-16: qty 30, 30d supply, fill #3
  Filled 2024-11-20 (×2): qty 30, 30d supply, fill #4

## 2024-07-01 NOTE — Patient Instructions (Signed)
 Medication Instructions:  No medications changes today   *If you need a refill on your cardiac medications before your next appointment, please call your pharmacy*  Lab Work: CMET, TSH, and Free T4 today If you have labs (blood work) drawn today and your tests are completely normal, you will receive your results only by: MyChart Message (if you have MyChart) OR A paper copy in the mail If you have any lab test that is abnormal or we need to change your treatment, we will call you to review the results.  Testing/Procedures: No testing/procedures were scheduled today  Follow-Up: At [facility], you and your health needs are our priority.  As part of our continuing mission to provide you with exceptional heart care, our providers are all part of one team.  This team includes your primary Cardiologist (physician) and Advanced Practice Providers or APPs (Physician Assistants and Nurse Practitioners) who all work together to provide you with the care you need, when you need it.  Your next appointment:   6-8 weeks  Provider:   You may see Will Gladis Norton, MD   We recommend signing up for the patient portal called MyChart.  Sign up information is provided on this After Visit Summary.  MyChart is used to connect with patients for Virtual Visits (Telemedicine).  Patients are able to view lab/test results, encounter notes, upcoming appointments, etc.  Non-urgent messages can be sent to your provider as well.   To learn more about what you can do with MyChart, go to [email or web address].

## 2024-07-01 NOTE — Progress Notes (Signed)
  Electrophysiology Office Note:   Date:  07/01/2024  ID:  Ryan CHRISTELLA Side, DOB 07/16/1956, MRN [id number]  Primary Cardiologist: Redell Shallow, MD Electrophysiologist: Will Gladis Norton, MD   Electrophysiologist:  Soyla Gladis Norton, MD      History of Present Illness:   Timothy Moon is a 68 y.o. male with h/o chronic HFrEF, persistent atrial fibrillation, thoracic aortic aneurysm 5 cm, cocaine use, nicotine dependence, hypertension, CKD seen today for post hospital follow up.    Admitted 8/26 - 8/30  with fall and found to be in VT in the 200s. Required cardioversion by EMS. UDS + for cocaine. Echo with EF 30%; Cath with mild to moderate non-obstructive disease. Not felt to be candidate for ICD given active cocaine use. Started on GDMT.   Since discharge from hospital the patient reports doing OK. Reports taking all medication as directed. Reports he has stopped cocaine. Needs refills on a couple of medications.  he denies chest pain, palpitations, dyspnea, PND, orthopnea, nausea, vomiting, dizziness, syncope, edema, weight gain, or early satiety.   Review of systems complete and found to be negative unless listed in HPI.   EP Information / Studies Reviewed:    EKG is ordered today. Personal review as below.  EKG Interpretation Date/Time:  Monday July 01 2024 10:48:48 EDT Ventricular Rate:  53 PR Interval:  236 QRS Duration:  114 QT Interval:  490 QTC Calculation: 459 R Axis:   -60  Text Interpretation: Sinus bradycardia with 1st degree A-V block Left axis deviation Incomplete left bundle branch block Minimal voltage criteria for LVH, may be normal variant ( Cornell product ) When compared with ECG of 19-Jun-2024 12:01, No significant change was found Confirmed by Lesia Sharper [phone]) on 07/01/2024 10:51:48 AM    Arrhythmia/Device History No specialty comments available.     Physical Exam:   VS:  BP 124/78   Pulse (!) 53   Ht 6' 3 (1.905 m)   Wt 253 lb  (114.8 kg)   SpO2 96%   BMI 31.62 kg/m    Wt Readings from Last 3 Encounters:  07/01/24 253 lb (114.8 kg)  06/24/24 249 lb 9.6 oz (113.2 kg)  06/19/24 247 lb (112 kg)     GEN: No acute distress NECK: No JVD; No carotid bruits CARDIAC: Regular rate and rhythm, no murmurs, rubs, gallops RESPIRATORY:  Clear to auscultation without rales, wheezing or rhonchi  ABDOMEN: Soft, non-tender, non-distended EXTREMITIES:  No edema; No deformity   ASSESSMENT AND PLAN:    Sustained VT In setting of cocaine use, technically a reversible cause Encouraged abstinence.  EF 25-30% If fails to improve despite GDMT and substance avoidance, may be candidate for ICD.  Continue amiodarone  200 mg daily. Labs today.  Continue mexitil  150 mg BID  Persistent AF NSR today.  Continue amiodarone  for now. Had previously discussed possibility of being an ablation candidate Continue eliquis  5 mg BID for CHA2DS2VASc of at least 6 Continue coreg  12.5 mg BID  Thoracic aortic aneurysm Following with TCTS   Follow up with Dr. Norton in 3 months  Signed, Sharper Prentice Lesia, PA-C

## 2024-07-02 ENCOUNTER — Other Ambulatory Visit (HOSPITAL_COMMUNITY): Payer: Self-pay

## 2024-07-02 ENCOUNTER — Ambulatory Visit: Payer: Self-pay | Admitting: Student

## 2024-07-02 LAB — COMPREHENSIVE METABOLIC PANEL WITH GFR
ALT: 49 IU/L — ABNORMAL HIGH (ref 0–44)
AST: 41 IU/L — ABNORMAL HIGH (ref 0–40)
Albumin: 4.2 g/dL (ref 3.9–4.9)
Alkaline Phosphatase: 103 IU/L (ref 47–123)
BUN/Creatinine Ratio: 15 (ref 10–24)
BUN: 28 mg/dL — ABNORMAL HIGH (ref 8–27)
Bilirubin Total: 0.7 mg/dL (ref 0.0–1.2)
CO2: 20 mmol/L (ref 20–29)
Calcium: 9.6 mg/dL (ref 8.6–10.2)
Chloride: 106 mmol/L (ref 96–106)
Creatinine, Ser: 1.81 mg/dL — ABNORMAL HIGH (ref 0.76–1.27)
Globulin, Total: 3.2 g/dL (ref 1.5–4.5)
Glucose: 98 mg/dL (ref 70–99)
Potassium: 4 mmol/L (ref 3.5–5.2)
Sodium: 141 mmol/L (ref 134–144)
Total Protein: 7.4 g/dL (ref 6.0–8.5)
eGFR: 40 mL/min/1.73 — ABNORMAL LOW (ref >59)

## 2024-07-02 LAB — T4, FREE: Free T4: 1.45 ng/dL (ref 0.82–1.77)

## 2024-07-02 LAB — TSH: TSH: 1.03 u[IU]/mL (ref 0.450–4.500)

## 2024-07-03 NOTE — Telephone Encounter (Signed)
 Received shipment notification from [employer]. Medication is being processed as of 09/16 and should be delivered within 7-10 business days.

## 2024-07-05 ENCOUNTER — Ambulatory Visit (HOSPITAL_COMMUNITY)
Admission: RE | Admit: 2024-07-05 | Discharge: 2024-07-05 | Disposition: A | Discharge status: Home / Self Care | Source: Ambulatory Visit | Attending: Internal Medicine | Admitting: Internal Medicine

## 2024-07-05 ENCOUNTER — Encounter (HOSPITAL_COMMUNITY): Payer: Self-pay | Admitting: Internal Medicine

## 2024-07-05 ENCOUNTER — Other Ambulatory Visit (HOSPITAL_COMMUNITY): Payer: Self-pay

## 2024-07-05 VITALS — BP 142/70 | HR 61 | Ht 75.0 in | Wt 254.4 lb

## 2024-07-05 DIAGNOSIS — I480 Paroxysmal atrial fibrillation: Secondary | ICD-10-CM

## 2024-07-05 DIAGNOSIS — I10 Essential (primary) hypertension: Secondary | ICD-10-CM

## 2024-07-05 DIAGNOSIS — I472 Ventricular tachycardia, unspecified: Secondary | ICD-10-CM

## 2024-07-05 DIAGNOSIS — I444 Left anterior fascicular block: Secondary | ICD-10-CM | POA: Insufficient documentation

## 2024-07-05 DIAGNOSIS — Z79899 Other long term (current) drug therapy: Secondary | ICD-10-CM | POA: Diagnosis not present

## 2024-07-05 DIAGNOSIS — I454 Nonspecific intraventricular block: Secondary | ICD-10-CM | POA: Diagnosis not present

## 2024-07-05 DIAGNOSIS — I440 Atrioventricular block, first degree: Secondary | ICD-10-CM | POA: Diagnosis not present

## 2024-07-05 DIAGNOSIS — I5022 Chronic systolic (congestive) heart failure: Secondary | ICD-10-CM

## 2024-07-05 DIAGNOSIS — N1832 Chronic kidney disease, stage 3b: Secondary | ICD-10-CM | POA: Diagnosis not present

## 2024-07-05 MED ORDER — SPIRONOLACTONE 25 MG PO TABS
12.5000 mg | ORAL_TABLET | Freq: Every day | ORAL | 3 refills | Status: AC
  Filled 2024-07-05 – 2024-07-16 (×2): qty 45, 90d supply, fill #0
  Filled 2024-08-15 – 2024-10-16 (×3): qty 45, 90d supply, fill #1

## 2024-07-05 NOTE — Progress Notes (Signed)
 [facility]  Referring Physician: Caffie Shed PA Primary Care: Corean Comment HF Cardiologist: Dr. Cherrie  HPI: [name] is a 68 y.o. male w/ history of poorly controlled HTN, ETOH use, tobacco use and cocaine use, and recently diagnosed with HFrEF.   Admitted to [facility] 1/23 with Acute HFrEF.  Echo showed moderately reduced LVEF 30 to 35%, mod LVH w/ GIIDD, normal RV. Chest CT also showed thoracic aortic aneurysm. Aortic root measures 4.7 to 4.9 cm. Coronary atherosclerosis also noted. UDS negative. Diuresed w/ IV Lasix . Did not undergo LHC given renal insufficiency, which also limited medical therapy. SCr 2.2 (baseline unknown). No ARNi/ARB/spiro/dig. Placed on Imdur /hydarl, coreg  and farxiga . D/c wt 249 lb.   He was seen in the HF [facility] 1/23 and referred to the [facility]. Hypertensive at the visit. Coreg  and hydralazine  increased. Also started on farxiga .   Found to have with a large aortic aneurysm 5 cm by CT on July 2023, he also missed his CT surgery   We have not seen him since 2023  Admitted 8/26 - 06/15/24  with fall and found to be in VT in the 200s. Required cardioversion by EMS. UDS + for cocaine. Had been taking his heart meds  Echo with EF 30%; Cath with mild to moderate non-obstructive disease. Not felt to be candidate for ICD given active cocaine use. Started on GDMT.    Since discharge from hospital the patient reports doing OK. Reports taking all medication as directed. Reports he has stopped cocaine. Smoking a few cigs per day (many days won't smoke at all)     CT 06/05/24 AoRoot 4.9 cm + COPD Echo EF 06/12/24 EF 25-30% Cath pLAD 60%, RCA 30% RPAV 60%    Cardiac Testing   - Echo 10/19/21 EF 30-35%. Global HK, moderate ventricular hypertrophy, grade II DD, RV normal Aneurysm of the ascending aorta, measuring 43 mm.   Past Medical History:  Diagnosis Date   Acute congestive heart failure (HCC) 10/19/2021   Ascending aortic aneurysm  (HCC) 11/23/2021   4.9 cm in January 2023   Chronic kidney disease, stage 3a (HCC) 10/19/2021   Cocaine abuse (HCC) 10/19/2021   Elevated troponin level not due myocardial infarction 10/19/2021   Erythrocytosis 06/11/2024   Essential hypertension 10/19/2021   Hypertensive urgency 10/19/2021   Hypokalemia 10/19/2021   Hyponatremia 06/11/2024   Leukocytosis 06/11/2024   Nicotine dependence, cigarettes, uncomplicated 10/19/2021   Ventricular tachycardia (HCC) 06/11/2024   VT (ventricular tachycardia) (HCC) 06/12/2024   Current Outpatient Medications  Medication Sig Dispense Refill   amiodarone  (PACERONE ) 200 MG tablet Take 1 tablet (200 mg total) by mouth daily. 30 tablet 6   apixaban  (ELIQUIS ) 5 MG TABS tablet Take 1 tablet (5 mg total) by mouth 2 (two) times daily. 60 tablet 3   aspirin  EC 81 MG tablet Take 81-162 mg by mouth every 4 (four) hours as needed for moderate pain (pain score 4-6). Swallow whole.     carvedilol  (COREG ) 12.5 MG tablet Take 1 tablet (12.5 mg total) by mouth 2 (two) times daily with a meal. NEEDS FOLLOW UP APPOINTMENT FOR MORE REFILLS 90 tablet 1   dapagliflozin  propanediol (FARXIGA ) 10 MG TABS tablet Take 1 tablet (10 mg total) by mouth daily. 30 tablet 0   furosemide  (LASIX ) 40 MG tablet Take 2 tablets (80 mg total) by mouth daily. 120 tablet 0   hydrALAZINE  (APRESOLINE ) 100 MG tablet Take 1 tablet (100 mg total) by mouth 3 (three) times  daily. Patient must schedule follow up visit with cardiology for further refills. 90 tablet 0   isosorbide  mononitrate (IMDUR ) 60 MG 24 hr tablet Take 1 tablet (60 mg total) by mouth daily. NEEDS FOLLOW UP APPOINTMENT FOR MORE REFILLS 90 tablet 0   mexiletine (MEXITIL ) 150 MG capsule Take 1 capsule (150 mg total) by mouth every 12 (twelve) hours. 60 capsule 6   Multiple Vitamin (MULTIVITAMIN) capsule Take 1 capsule by mouth as needed. Centrum Silver for men 90 capsule 0   nitroGLYCERIN  (NITROSTAT ) 0.4 MG SL tablet Place 1 tablet  (0.4 mg total) under the tongue every 5 (five) minutes as needed for chest pain. 30 tablet 12   potassium chloride  SA (KLOR-CON  M) 20 MEQ tablet Take 1 tablet (20 mEq total) by mouth daily. NEEDS FOLLOW UP APPOINTMENT FOR MORE REFILLS 90 tablet 0   rosuvastatin  (CRESTOR ) 10 MG tablet Take 1 tablet (10 mg total) by mouth daily. 30 tablet 1   sacubitril -valsartan  (ENTRESTO ) 24-26 MG Take 1 tablet by mouth 2 (two) times daily. 60 tablet 1   No current facility-administered medications for this encounter.   No Known Allergies  Social History   Socioeconomic History   Marital status: Divorced    Spouse name: Not on file   Number of children: 2   Years of education: Not on file   Highest education level: Associate degree: academic program  Occupational History   Occupation: retired  Tobacco Use   Smoking status: Former    Current packs/day: 0.00    Average packs/day: 1 pack/day for 20.0 years (20.0 ttl pk-yrs)    Types: Cigarettes    Start date: 10/17/2001    Quit date: 10/17/2021    Years since quitting: 2.7   Smokeless tobacco: Never   Tobacco comments:    Former smoker 04/24/24  Vaping Use   Vaping status: Never Used  Substance and Sexual Activity   Alcohol use: Not Currently    Comment: heavy drinker x 7 years, quit 11 years ago.   Drug use: Not Currently    Types: Cocaine    Comment: 1-2x/mo last use 10/09/2021   Sexual activity: Not Currently  Other Topics Concern   Not on file  Social History Narrative   Not on file   Social Drivers of Health   Financial Resource Strain: Low Risk  (07/25/2022)   Overall Financial Resource Strain (CARDIA)    Difficulty of Paying Living Expenses: Not very hard  Food Insecurity: No Food Insecurity (06/18/2024)   Hunger Vital Sign    Worried About Running Out of Food in the Last Year: Never true    Ran Out of Food in the Last Year: Never true  Transportation Needs: No Transportation Needs (06/18/2024)   PRAPARE - Scientist, research (physical sciences) (Medical): No    Lack of Transportation (Non-Medical): No  Physical Activity: Inactive (07/25/2022)   Exercise Vital Sign    Days of Exercise per Week: 0 days    Minutes of Exercise per Session: 0 min  Stress: No Stress Concern Present (07/25/2022)   [employer] of Occupational Health - Occupational Stress Questionnaire    Feeling of Stress : Only a little  Social Connections: Unknown (06/15/2024)   Social Connection and Isolation Panel    Frequency of Communication with Friends and Family: More than three times a week    Frequency of Social Gatherings with Friends and Family: More than three times a week    Attends Religious Services: Not  on file    Active Member of Clubs or Organizations: No    Attends Banker Meetings: Never    Marital Status: Divorced  Intimate Partner Violence: Not At Risk (06/18/2024)   Humiliation, Afraid, Rape, and Kick questionnaire    Fear of Current or Ex-Partner: No    Emotionally Abused: No    Physically Abused: No    Sexually Abused: No   Family History  Problem Relation Age of Onset   Heart disease Neg Hx    BP (!) 142/70   Pulse 61   Ht 6' 3 (1.905 m)   Wt 115.4 kg (254 lb 6.4 oz)   SpO2 96%   BMI 31.80 kg/m   Wt Readings from Last 3 Encounters:  07/05/24 115.4 kg (254 lb 6.4 oz)  07/01/24 114.8 kg (253 lb)  06/24/24 113.2 kg (249 lb 9.6 oz)   PHYSICAL EXAM: General:  NAD. No resp difficulty HEENT: Normal Neck: Supple. No JVD. Carotids 2+ bilat; no bruits. No lymphadenopathy or thryomegaly appreciated. Cor: PMI nondisplaced. Irregular rate & rhythm. No rubs, gallops or murmurs. Lungs: Clear Abdomen: Soft, nontender, nondistended. No hepatosplenomegaly. No bruits or masses. Good bowel sounds. Extremities: No cyanosis, clubbing, rash, edema Neuro: Alert & oriented x 3, cranial nerves grossly intact. Moves all 4 extremities w/o difficulty. Affect pleasant.  ECG: atrial fibrillation 92 bpm (personally  reviewed).  ReDs: 31%  ASSESSMENT & PLAN:  Chronic Systolic Heart Failure -  Echo 1/23: EF 30 to 35%, mod LVH w/ GIIDD, normal RV (no prior study for comparison) -  Etiology uncertain. 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 He will need cath if renal fx improves (chest CT + coronary calcifications, multiple RFs, ETOH and cocaine may also contribute) - 8/25 admitted with VT - Echo 06/12/24 EF 25-30% - Cath 8/25 pLAD 60%, RCA 30% RPAV 60%  - NYHA II. Volume ok Continue lasix  40 daily - Continue carvedilol  12.5mg  bid - Continue Imdur  60 mg daily. - Continue hydralazine  75 mg tid - Continue Farxiga  10 mg daily.  - Add spiro 12.5 - stop Kcl - labs 1 week - get cMRI - Refer CR   2. Atrial fibrillation, persistent - s/p DC-CV 06/19/24 - Seen by EP 07/01/24. Continue amio 200 daily. No ablation at this time - Continue Eliquis  5 bid. No bleeding - Continue to follow with AF Clinic - Needs sleep study   3. Hypertension  - Long h/o poor control. - BP still elevated - add spiro  4. VT - sustained VT in 8/25 in setting of cocaine use - seen by EP. On mexilitene 150 bid and amio 200 daily - Following with EP for possible ICD once abstinent from substance abuse   5. CKD 3b - Recent hospitalization w/ SCr elevated at 2.2 - ? Hypertensive nephropathy. BP control per above.  - Last Scr 1.8 on 07/01/24 Personally reviewed - Continue Farxiga  10 mg daily.   6. Thoracic aortic aneurysm - aortic root measured 4.7 to 4.9 cm on noncontrast chest CT 1/23. - CT 06/05/24 AoRoot 4.9 cm + COPD - Has follow up with Dr. Henrickson, on 07/16/24  - Tight BP control - On ? blocker.  7. Cocaine abuse - Discussed need for cessation  8. Snoring - Needs sleep study   9. Obesity - Body mass index is 31.8 kg/m. - Consider GLP1-RA  I spent a total of 51 minutes today: 1) reviewing the patient's medical records including previous charts, labs and recent  notes from other providers;  2) examining the patient and counseling them on their medical issues/explaining the plan of care; 3) adjusting meds as needed and 4) ordering lab work or other needed tests.    Toribio Fuel, MD  2:31 PM

## 2024-07-05 NOTE — Patient Instructions (Signed)
 Medication Changes:  STOP POTASSIUM   START SPIRONOLACTONE  12.5MG  ONCE DAILY   Lab Work:  RETURN FOR LABS AS SCHEDULED IN 1 WEEK   Testing/Procedures:  SCHEDULING WILL REACH OUT TO ARRANGE THIS ONCE APPROVED WITH INSURANCE  [facility] [address] [employer], [place] [address] Please take advantage of the free valet parking available at the [facility] and [employer] (Entrance C).  Proceed to the [facility] Radiology Department (First Floor) for check-in.   Magnetic resonance imaging (MRI) is a painless test that produces images of the inside of the body without using Xrays.  During an MRI, strong magnets and radio waves work together in a Data processing manager to form detailed images.   MRI images may provide more details about a medical condition than X-rays, CT scans, and ultrasounds can provide.  You may be given earphones to listen for instructions.  You may eat a light breakfast and take medications as ordered with the exception of furosemide , hydrochlorothiazide, chlorthalidone or spironolactone  (or any other fluid pill). If you are undergoing a stress MRI, please avoid stimulants for 12 hr prior to test. (I.e. Caffeine, nicotine, chocolate, or antihistamine medications)  If your provider has ordered anti-anxiety medications for this test, then you will need a driver.  An IV will be inserted into one of your veins. Contrast material will be injected into your IV. It will leave your body through your urine within a day. You may be told to drink plenty of fluids to help flush the contrast material out of your system.  You will be asked to remove all metal, including: Watch, jewelry, and other metal objects including hearing aids, hair pieces and dentures. Also wearable glucose monitoring systems (ie. Freestyle Libre and Omnipods) (Braces and fillings normally are not a problem.)   TEST WILL TAKE APPROXIMATELY 1 HOUR  PLEASE NOTIFY SCHEDULING AT LEAST 24 HOURS IN  ADVANCE IF YOU ARE UNABLE TO KEEP YOUR APPOINTMENT. [phone]  For more information and frequently asked questions, please visit our website : [email or web address]  Please call the Cardiac Imaging Nurse Navigators with any questions/concerns. [phone] Office    Referrals:  YOU HAVE BEEN REFERRED TO CARDIAC REHAB THEY WILL REACH OUT TO YOU OR CALL TO ARRANGE THIS. PLEASE CALL US  WITH ANY CONCERNS   Follow-Up in: 2 MONTHS AS SCHEDULED WITH APP CLINIC   At the [facility], you and your health needs are our priority. We have a designated team specialized in the treatment of Heart Failure. This Care Team includes your primary Heart Failure Specialized Cardiologist (physician), Advanced Practice Providers (APPs- Physician Assistants and Nurse Practitioners), and Pharmacist who all work together to provide you with the care you need, when you need it.   You may see any of the following providers on your designated Care Team at your next follow up:  Dr. Toribio Fuel Dr. Ezra Shuck Dr. Ria Commander Dr. Odis Brownie Greig Mosses, NP Caffie Shed, [place] [facility] [place], [place] Beckey Coe, NP [place] Lee, NP Tinnie Redman, PharmD   Please be sure to bring in all your medications bottles to every appointment.   Need to Contact Us :  If you have any questions or concerns before your next appointment please send us  a message through [place] or call our office at [phone].    TO LEAVE A MESSAGE FOR THE NURSE SELECT OPTION 2, PLEASE LEAVE A MESSAGE INCLUDING: YOUR NAME DATE OF BIRTH CALL BACK NUMBER REASON FOR CALL**this is important as we  prioritize the call backs  YOU WILL RECEIVE A CALL BACK THE SAME DAY AS LONG AS YOU CALL BEFORE 4:00 PM

## 2024-07-08 ENCOUNTER — Encounter (HOSPITAL_COMMUNITY): Payer: Self-pay

## 2024-07-08 ENCOUNTER — Telehealth (HOSPITAL_COMMUNITY): Payer: Self-pay

## 2024-07-08 ENCOUNTER — Other Ambulatory Visit (HOSPITAL_COMMUNITY): Payer: Self-pay

## 2024-07-08 NOTE — Telephone Encounter (Signed)
 Contacted [employer] for status update: Proof of income is needed before making a determination for this patient. Left voicemail for patient.

## 2024-07-08 NOTE — Telephone Encounter (Signed)
Attempted to call patient in regards to Cardiac Rehab - LM on VM   Sent letter 

## 2024-07-10 ENCOUNTER — Ambulatory Visit (HOSPITAL_COMMUNITY): Admitting: Physician Assistant

## 2024-07-12 ENCOUNTER — Other Ambulatory Visit (HOSPITAL_COMMUNITY): Payer: Self-pay

## 2024-07-12 NOTE — Telephone Encounter (Signed)
 Spoke to patient by phone, he is calling to request income attestation letter from [employer]

## 2024-07-15 ENCOUNTER — Other Ambulatory Visit (HOSPITAL_COMMUNITY): Payer: Self-pay

## 2024-07-15 ENCOUNTER — Other Ambulatory Visit (HOSPITAL_COMMUNITY)

## 2024-07-16 ENCOUNTER — Other Ambulatory Visit (HOSPITAL_COMMUNITY): Payer: Self-pay

## 2024-07-16 ENCOUNTER — Ambulatory Visit: Admitting: Thoracic Surgery (Cardiothoracic Vascular Surgery)

## 2024-07-19 NOTE — Telephone Encounter (Signed)
 Patient was approved to receive Entresto  from [employer] Effective 07/16/2024 to 10/16/2024

## 2024-07-23 ENCOUNTER — Ambulatory Visit (HOSPITAL_COMMUNITY)
Admission: RE | Admit: 2024-07-23 | Discharge: 2024-07-23 | Disposition: A | Discharge status: Home / Self Care | Source: Ambulatory Visit | Attending: Physician Assistant | Admitting: Physician Assistant

## 2024-07-23 ENCOUNTER — Encounter (HOSPITAL_COMMUNITY): Payer: Self-pay

## 2024-07-23 VITALS — BP 136/80 | HR 51 | Ht 75.0 in | Wt 253.0 lb

## 2024-07-23 DIAGNOSIS — I4891 Unspecified atrial fibrillation: Secondary | ICD-10-CM | POA: Diagnosis not present

## 2024-07-23 DIAGNOSIS — D6869 Other thrombophilia: Secondary | ICD-10-CM | POA: Insufficient documentation

## 2024-07-23 DIAGNOSIS — Z79899 Other long term (current) drug therapy: Secondary | ICD-10-CM | POA: Diagnosis not present

## 2024-07-23 DIAGNOSIS — Z5181 Encounter for therapeutic drug level monitoring: Secondary | ICD-10-CM | POA: Insufficient documentation

## 2024-07-23 DIAGNOSIS — I4811 Longstanding persistent atrial fibrillation: Secondary | ICD-10-CM | POA: Diagnosis not present

## 2024-07-23 NOTE — Progress Notes (Signed)
 Primary Care Physician: Lucius Krabbe, NP Referring Physician: Harlene Gainer, FNP Primary EP: Dr Inocencio [facility]: Dr Cherrie Nottingham Timothy Moon is a 68 y.o. male with a h/o of poorly controlled HTN, ETOH use, tobacco use and cocaine use, and recently diagnosed with HFrEF.   Admitted to [facility] 1/23 with Acute HFrEF.  Echo showed moderately reduced LVEF 30 to 35%, mod LVH w/ GIIDD, normal RV. Chest CT also showed thoracic aortic aneurysm. Aortic root measures 4.7 to 4.9 cm. Coronary atherosclerosis also noted. UDS negative. Diuresed w/ IV Lasix . Did not undergo LHC given renal insufficiency, which also limited medical therapy. SCr 2.2 (baseline unknown). No ARNi/ARB/spiro/dig. Placed on Imdur /hydarl, coreg  and farxiga . D/c wt 249 lb.   He was seen in the HF [facility] 1/23 and referred to the [facility]. Hypertensive at the visit. Coreg  and hydralazine  increased. Also started on farxiga . - Echo 10/19/21 EF 30-35%. Global HK, moderate ventricular hypertrophy, grade II DD, RV normal Aneurysm of the ascending aorta, measuring 43 mm.   He was seen by Harlene Gainer, FNP, 11/24/21 and was started on eliquis  5 mg bid and referred to afib clinic to discuss cardioversion. However, he states today that he has only been taking  eliquis  1x daily since 2/9 . His HR's at home in afib  are in the 60-80's.   He is here with his friend today. He states no recent cocaine. Denies alcohol use. He does not appear to be symptomatic with the afib. States compliance with BB. Recently increased.   F/u in afib clinic, 08/11/22. I have not seen since February at which time he was suppose to see me back in 2 weeks to have a cardioversion. He missed Dr. Nelle visit around that time as well. He recently aw Harlene Gainer, NP, 10/24 and was still in afib. He is here now to see about obtaining cardioversion. In the meantime, pt states that he has been feeling all right. He has a rx for eliquis  thru the mail  coming to him, but he was given samples today to start back tonight. After 21 days on anticoagulation, we discussed going on to have a cardioversion. He is in agreement.   F/u in afib clinic, pt states that he has been back on eliquis  5 mg bid since 10/27. No missed doses since then. He remains in afib at 111 bpm. Fluid status stable. He wants to proceed with cardioversion.   F/u  afib clinic, 09/22/22 after successful cardioversion from 09/07/22 but unfortunately ERAF. I will refer to EP to be considered for ablation. EKG shows Afib at 96 bpm.   Follow up 04/24/24. Patient returns for follow up for atrial fibrillation. He was seen by Dr Inocencio 11/03/22 and amiodarone  was recommended. Patient was concerned about the possible side effects and never started the medication. He was lost to follow up. He presented to the ED 04/17/24 with right sided weakness and aphasia. He had not been taking Eliquis  due to loss of coverage. Head CT and MRI did not show acute CVA. He remains in afib today. He has been taking Eliquis  consistently since his ED visit. No bleeding issues.   Follow up 05/24/24. Patient returns for follow up for atrial fibrillation. He remains in rate controlled afib. He states his fatigue has improved since starting amiodarone . No bleeding issues on anticoagulation.   Follow up 07/23/24. Patient returns for follow up for atrial fibrillation and amiodarone  monitoring. He is s/p DCCV on 06/19/24 and remains in  SR today. He reports that he feels much better with more energy. No bleeding issues on anticoagulation.    Today, he  denies symptoms of palpitations, chest pain, shortness of breath, orthopnea, PND, lower extremity edema, dizziness, presyncope, syncope, snoring, daytime somnolence, bleeding, or neurologic sequela. The patient is tolerating medications without difficulties and is otherwise without complaint today.    Past Medical History:  Diagnosis Date   Acute congestive heart failure (HCC)  10/19/2021   Ascending aortic aneurysm 11/23/2021   4.9 cm in January 2023   Chronic kidney disease, stage 3a (HCC) 10/19/2021   Cocaine abuse (HCC) 10/19/2021   Elevated troponin level not due myocardial infarction 10/19/2021   Erythrocytosis 06/11/2024   Essential hypertension 10/19/2021   Hypertensive urgency 10/19/2021   Hypokalemia 10/19/2021   Hyponatremia 06/11/2024   Leukocytosis 06/11/2024   Nicotine dependence, cigarettes, uncomplicated 10/19/2021   Ventricular tachycardia (HCC) 06/11/2024   VT (ventricular tachycardia) (HCC) 06/12/2024     Current Outpatient Medications  Medication Sig Dispense Refill   amiodarone  (PACERONE ) 200 MG tablet Take 1 tablet (200 mg total) by mouth daily. 30 tablet 6   apixaban  (ELIQUIS ) 5 MG TABS tablet Take 1 tablet (5 mg total) by mouth 2 (two) times daily. 60 tablet 3   aspirin  EC 81 MG tablet Take 81-162 mg by mouth every 4 (four) hours as needed for moderate pain (pain score 4-6). Swallow whole. (Patient taking differently: Take 162 mg by mouth as needed for moderate pain (pain score 4-6). Swallow whole.)     carvedilol  (COREG ) 12.5 MG tablet Take 1 tablet (12.5 mg total) by mouth 2 (two) times daily with a meal. NEEDS FOLLOW UP APPOINTMENT FOR MORE REFILLS 90 tablet 1   dapagliflozin  propanediol (FARXIGA ) 10 MG TABS tablet Take 1 tablet (10 mg total) by mouth daily. 30 tablet 0   furosemide  (LASIX ) 40 MG tablet Take 2 tablets (80 mg total) by mouth daily. 120 tablet 0   hydrALAZINE  (APRESOLINE ) 100 MG tablet Take 1 tablet (100 mg total) by mouth 3 (three) times daily. Patient must schedule follow up visit with cardiology for further refills. 90 tablet 0   isosorbide  mononitrate (IMDUR ) 60 MG 24 hr tablet Take 1 tablet (60 mg total) by mouth daily. NEEDS FOLLOW UP APPOINTMENT FOR MORE REFILLS 90 tablet 0   mexiletine (MEXITIL ) 150 MG capsule Take 1 capsule (150 mg total) by mouth every 12 (twelve) hours. 60 capsule 6   Multiple Vitamin  (MULTIVITAMIN) capsule Take 1 capsule by mouth as needed. Centrum Silver for men 90 capsule 0   nitroGLYCERIN  (NITROSTAT ) 0.4 MG SL tablet Place 1 tablet (0.4 mg total) under the tongue every 5 (five) minutes as needed for chest pain. 30 tablet 12   rosuvastatin  (CRESTOR ) 10 MG tablet Take 1 tablet (10 mg total) by mouth daily. 30 tablet 1   sacubitril -valsartan  (ENTRESTO ) 24-26 MG Take 1 tablet by mouth 2 (two) times daily. 60 tablet 1   spironolactone  (ALDACTONE ) 25 MG tablet Take 1/2 tablets (12.5 mg total) by mouth daily. 45 tablet 3   No current facility-administered medications for this encounter.    ROS- All systems are reviewed and negative except as per the HPI above  Physical Exam: Vitals:   07/23/24 0944  BP: 136/80  Pulse: (!) 51  Weight: 114.8 kg  Height: 6' 3 (1.905 m)     Wt Readings from Last 3 Encounters:  07/23/24 114.8 kg  07/05/24 115.4 kg  07/01/24 114.8 kg    GEN:  Well nourished, well developed in no acute distress CARDIAC: Regular rate and rhythm, no murmurs, rubs, gallops RESPIRATORY:  Clear to auscultation without rales, wheezing or rhonchi  ABDOMEN: Soft, non-tender, non-distended EXTREMITIES:  No edema; No deformity    ECG today demonstrates SB, 1st degree AV block, LAFB Vent. rate 51 BPM PR interval 232 ms QRS duration 116 ms QT/QTcB 476/438 ms   Echo 06/12/24  1. Left ventricular ejection fraction, by estimation, is 25 to 30%. The  left ventricle has severely decreased function. The left ventricle  demonstrates global hypokinesis. There is moderate concentric left  ventricular hypertrophy. Left ventricular diastolic function could not be evaluated.   2. Right ventricular systolic function is normal. The right ventricular  size is normal.   3. The mitral valve is grossly normal. Trivial mitral valve  regurgitation.   4. The aortic valve is grossly normal. Aortic valve regurgitation is  mild.   5. Aortic dilatation noted. There is  dilatation of the aortic root,  measuring 48 mm.    CHA2DS2-VASc Score = 6  The patient's score is based upon: CHF History: 1 HTN History: 1 Diabetes History: 0 Stroke History: 2 (TIA) Vascular Disease History: 1 Age Score: 1 Gender Score: 0       ASSESSMENT AND PLAN: Longstanding Persistent Atrial Fibrillation (ICD10:  I48.11) The patient's CHA2DS2-VASc score is 6, indicating a 9.7% annual risk of stroke.   S/p DCCV 06/19/24 Patient appears to be maintaining SR Continue amiodarone  200 mg daily Continue Eliquis  5 mg BID Continue carvedilol  12.5 mg BID  Secondary Hypercoagulable State (ICD10:  D68.69) The patient is at significant risk for stroke/thromboembolism based upon his CHA2DS2-VASc Score of 6.  Continue Apixaban  (Eliquis ). No bleeding issues.   High Risk Medication Monitoring (ICD 10: [id number]) Patient requires ongoing monitoring for anti-arrhythmic medication which has the potential to cause life threatening arrhythmias. Intervals on ECG acceptable for amiodarone  monitoring.   Chronic HFrEF EF 25-30% GDMT per [facility] team Fluid status appears stable today  HTN Stable on current regimen   Follow up with Dr Inocencio as scheduled.     Daril Kicks PA-C Afib [facility] [address] [place], [place] [address] [phone]

## 2024-07-24 ENCOUNTER — Ambulatory Visit (HOSPITAL_COMMUNITY)
Admission: RE | Admit: 2024-07-24 | Discharge: 2024-07-24 | Disposition: A | Discharge status: Home / Self Care | Source: Ambulatory Visit | Attending: Cardiology | Admitting: Cardiology

## 2024-07-24 ENCOUNTER — Telehealth (HOSPITAL_COMMUNITY): Payer: Self-pay

## 2024-07-24 DIAGNOSIS — I77810 Thoracic aortic ectasia: Secondary | ICD-10-CM | POA: Insufficient documentation

## 2024-07-24 DIAGNOSIS — F1490 Cocaine use, unspecified, uncomplicated: Secondary | ICD-10-CM | POA: Insufficient documentation

## 2024-07-24 DIAGNOSIS — I5022 Chronic systolic (congestive) heart failure: Secondary | ICD-10-CM | POA: Insufficient documentation

## 2024-07-24 LAB — BASIC METABOLIC PANEL WITH GFR
Anion gap: 9 (ref 5–15)
BUN: 30 mg/dL — ABNORMAL HIGH (ref 8–23)
CO2: 25 mmol/L (ref 22–32)
Calcium: 9.6 mg/dL (ref 8.9–10.3)
Chloride: 107 mmol/L (ref 98–111)
Creatinine, Ser: 2.16 mg/dL — ABNORMAL HIGH (ref 0.61–1.24)
GFR, Estimated: 33 mL/min — ABNORMAL LOW (ref >60)
Glucose, Bld: 80 mg/dL (ref 70–99)
Potassium: 4.3 mmol/L (ref 3.5–5.1)
Sodium: 141 mmol/L (ref 135–145)

## 2024-07-24 NOTE — Telephone Encounter (Signed)
 Pt insurance is active and benefits verified through Medicare B. Co-pay $0, DED $257/unknown met, out of pocket $0/$0 met, co-insurance 20%. No pre-authorization required.

## 2024-07-24 NOTE — Telephone Encounter (Signed)
 Disregard previous insurance information, patient is cardiac rehab and not pulmonary.  F/u call regarding cardiac rehab, patient confirmed interest. Passing to nurse navigator for review.

## 2024-07-25 ENCOUNTER — Other Ambulatory Visit (HOSPITAL_COMMUNITY): Payer: Self-pay | Admitting: Internal Medicine

## 2024-07-25 ENCOUNTER — Ambulatory Visit (HOSPITAL_COMMUNITY)
Admission: RE | Admit: 2024-07-25 | Discharge: 2024-07-25 | Disposition: A | Source: Ambulatory Visit | Attending: Internal Medicine | Admitting: Internal Medicine

## 2024-07-25 DIAGNOSIS — I5022 Chronic systolic (congestive) heart failure: Secondary | ICD-10-CM

## 2024-07-25 MED ORDER — GADOBUTROL 1 MMOL/ML IV SOLN
10.0000 mL | Freq: Once | INTRAVENOUS | Status: AC | PRN
  Administered 2024-07-25: 10 mL via INTRAVENOUS

## 2024-07-26 ENCOUNTER — Telehealth (HOSPITAL_COMMUNITY): Payer: Self-pay

## 2024-07-26 NOTE — Telephone Encounter (Signed)
 Spoke to patient by phone, patient consents to advocate signature for authorization.  Re-enrollment form faxed on 07/26/2024.

## 2024-07-26 NOTE — Telephone Encounter (Signed)
 Advanced Heart Failure Patient Advocate Encounter  Received notification from [employer] that patient has been conditionally approved for 2026 but needs to submit updated authorization form. Attempted to contact patient by phone, no answer. Left voicemail.  Rachel DEL, CPhT Rx Patient Advocate Phone: [phone]

## 2024-07-28 NOTE — Addendum Note (Signed)
 Encounter addended by: Leone Putman R, MD on: 07/28/2024 6:55 PM  Actions taken: Level of Service modified, Visit diagnoses modified, Pend clinical note

## 2024-07-28 NOTE — Addendum Note (Signed)
 Encounter addended by: Noal Abshier R, MD on: 07/28/2024 9:25 PM  Actions taken: Clinical Note Signed

## 2024-07-29 ENCOUNTER — Other Ambulatory Visit: Payer: Self-pay | Admitting: Family

## 2024-07-29 DIAGNOSIS — I5022 Chronic systolic (congestive) heart failure: Secondary | ICD-10-CM

## 2024-07-29 DIAGNOSIS — I10 Essential (primary) hypertension: Secondary | ICD-10-CM

## 2024-07-29 NOTE — Telephone Encounter (Signed)
 Patient was approved to receive Farxiga  from [employer] Effective 10/17/2024 to 10/16/2025

## 2024-07-30 ENCOUNTER — Ambulatory Visit: Admitting: Thoracic Surgery (Cardiothoracic Vascular Surgery)

## 2024-07-30 ENCOUNTER — Encounter (HOSPITAL_COMMUNITY): Payer: Self-pay

## 2024-07-30 ENCOUNTER — Other Ambulatory Visit (HOSPITAL_COMMUNITY): Payer: Self-pay

## 2024-08-02 ENCOUNTER — Telehealth: Payer: Self-pay | Admitting: Cardiology

## 2024-08-02 DIAGNOSIS — I10 Essential (primary) hypertension: Secondary | ICD-10-CM

## 2024-08-02 DIAGNOSIS — I5022 Chronic systolic (congestive) heart failure: Secondary | ICD-10-CM

## 2024-08-02 NOTE — Telephone Encounter (Signed)
 *  STAT* If patient is at the pharmacy, call can be transferred to refill team.   1. Which medications need to be refilled? (please list name of each medication and dose if known)   hydrALAZINE  (APRESOLINE ) 100 MG tablet     2. Would you like to learn more about the convenience, safety, & potential cost savings by using the [facility]? No      3. Are you open to using the [facility] (Type [facility]. ).no    4. Which pharmacy/location (including street and city if local pharmacy) is medication to be sent to?  [place] [facility]     5. Do they need a 30 day or 90 day supply? 90 days

## 2024-08-05 ENCOUNTER — Other Ambulatory Visit (HOSPITAL_COMMUNITY): Payer: Self-pay

## 2024-08-05 MED ORDER — HYDRALAZINE HCL 100 MG PO TABS
100.0000 mg | ORAL_TABLET | Freq: Three times a day (TID) | ORAL | 6 refills | Status: AC
  Filled 2024-08-05 – 2024-08-15 (×2): qty 90, 30d supply, fill #0
  Filled 2024-09-17: qty 90, 30d supply, fill #1
  Filled 2024-10-16: qty 90, 30d supply, fill #2
  Filled 2024-11-20 (×2): qty 90, 30d supply, fill #3

## 2024-08-05 NOTE — Telephone Encounter (Signed)
 Will forward to Heart Failure Clinic. Patient only sees EP for A. FIB. Patient has not been seeing a general cardiologist.

## 2024-08-05 NOTE — Telephone Encounter (Signed)
 Refill sent.

## 2024-08-12 ENCOUNTER — Ambulatory Visit: Admitting: Cardiology

## 2024-08-13 ENCOUNTER — Encounter: Payer: Self-pay | Admitting: Thoracic Surgery (Cardiothoracic Vascular Surgery)

## 2024-08-13 ENCOUNTER — Ambulatory Visit
Attending: Thoracic Surgery (Cardiothoracic Vascular Surgery) | Admitting: Thoracic Surgery (Cardiothoracic Vascular Surgery)

## 2024-08-13 VITALS — BP 116/72 | HR 51 | Resp 20 | Wt 253.0 lb

## 2024-08-13 DIAGNOSIS — I7121 Aneurysm of the ascending aorta, without rupture: Secondary | ICD-10-CM | POA: Insufficient documentation

## 2024-08-13 NOTE — Progress Notes (Signed)
 [address], [place] [address]             [phone]     HPI: Timothy Moon returns for a follow-up visit regarding his ascending aneurysm.  Timothy Moon is a 68 year old man with a history of hypertension, tobacco abuse, stage III chronic kidney disease, atrial fibrillation, congestive heart failure, and ascending aortic aneurysm.  I last saw him in August 2023.  CT showed a 4.9 cm ascending aneurysm.  Recommended follow-up scan in 6 months.  However he was lost to follow-up.  He was admitted in late August after a fall and found to be in ventricular tachycardia with a rate in the 200s.  Drug screen was positive for cocaine.  He had an echocardiogram which showed ejection fraction of 30%.  There was mild AI.  There was a dilated aortic root.  Underwent catheterization which showed moderate nonobstructive disease.  Since then he has been feeling well.  Denies cocaine use.  He says he has quit smoking for the most part.  Denies chest pain, pressure, tightness, shortness of breath.  Past Medical History:  Diagnosis Date   Acute congestive heart failure (HCC) 10/19/2021   Ascending aortic aneurysm 11/23/2021   4.9 cm in January 2023   Chronic kidney disease, stage 3a (HCC) 10/19/2021   Cocaine abuse (HCC) 10/19/2021   Elevated troponin level not due myocardial infarction 10/19/2021   Erythrocytosis 06/11/2024   Essential hypertension 10/19/2021   Hypertensive urgency 10/19/2021   Hypokalemia 10/19/2021   Hyponatremia 06/11/2024   Leukocytosis 06/11/2024   Nicotine dependence, cigarettes, uncomplicated 10/19/2021   Ventricular tachycardia (HCC) 06/11/2024   VT (ventricular tachycardia) (HCC) 06/12/2024    Current Outpatient Medications  Medication Sig Dispense Refill   amiodarone  (PACERONE ) 200 MG tablet Take 1 tablet (200 mg total) by mouth daily. 30 tablet 6   apixaban  (ELIQUIS ) 5 MG TABS tablet Take 1 tablet (5 mg total) by mouth 2 (two)  times daily. 60 tablet 3   aspirin  EC 81 MG tablet Take 81-162 mg by mouth every 4 (four) hours as needed for moderate pain (pain score 4-6). Swallow whole. (Patient taking differently: Take 162 mg by mouth as needed for moderate pain (pain score 4-6). Swallow whole.)     carvedilol  (COREG ) 12.5 MG tablet Take 1 tablet (12.5 mg total) by mouth 2 (two) times daily with a meal. NEEDS FOLLOW UP APPOINTMENT FOR MORE REFILLS 90 tablet 1   dapagliflozin  propanediol (FARXIGA ) 10 MG TABS tablet Take 1 tablet (10 mg total) by mouth daily. 30 tablet 0   furosemide  (LASIX ) 40 MG tablet Take 2 tablets (80 mg total) by mouth daily. 120 tablet 0   hydrALAZINE  (APRESOLINE ) 100 MG tablet Take 1 tablet (100 mg total) by mouth 3 (three) times daily. 90 tablet 6   isosorbide  mononitrate (IMDUR ) 60 MG 24 hr tablet Take 1 tablet (60 mg total) by mouth daily. NEEDS FOLLOW UP APPOINTMENT FOR MORE REFILLS 90 tablet 0   mexiletine (MEXITIL ) 150 MG capsule Take 1 capsule (150 mg total) by mouth every 12 (twelve) hours. 60 capsule 6   Multiple Vitamin (MULTIVITAMIN) capsule Take 1 capsule by mouth as needed. Centrum Silver for men 90 capsule 0   nitroGLYCERIN  (NITROSTAT ) 0.4 MG SL tablet Place 1 tablet (0.4 mg total) under the tongue every 5 (five) minutes as needed for chest pain. 30 tablet 12   rosuvastatin  (CRESTOR ) 10 MG tablet Take 1 tablet (10 mg  total) by mouth daily. 30 tablet 1   sacubitril -valsartan  (ENTRESTO ) 24-26 MG Take 1 tablet by mouth 2 (two) times daily. 60 tablet 1   spironolactone  (ALDACTONE ) 25 MG tablet Take 1/2 tablets (12.5 mg total) by mouth daily. 45 tablet 3   No current facility-administered medications for this visit.    Physical Exam BP 116/72 (BP Location: Left Arm, Patient Position: Sitting, Cuff Size: Large)   Pulse (!) 51   Resp 20   Wt 253 lb (114.8 kg)   SpO2 94% Comment: RA  BMI 31.67 kg/m  68 year old man in no acute distress Alert and oriented x 3 with no focal deficits Lungs  clear with equal breath sounds bilaterally Cardiac regular rate and rhythm, no murmur or gallop No carotid bruits  Diagnostic Tests: CT CHEST WITHOUT CONTRAST   TECHNIQUE: Multidetector CT imaging of the chest was performed following the standard protocol without IV contrast.   RADIATION DOSE REDUCTION: This exam was performed according to the departmental dose-optimization program which includes automated exposure control, adjustment of the mA and/or kV according to patient size and/or use of iterative reconstruction technique.   COMPARISON:  May 13, 2022   FINDINGS: Cardiovascular: Mild cardiomegaly. No pericardial effusion. Diffuse multi-vessel coronary atherosclerosis. Unchanged fusiform aneurysm of the ascending aorta measuring 4.9 cm (previously, 4.8 cm). Diffuse aortic atherosclerosis.   Mediastinum/Nodes: No mediastinal mass.Unchanged 1.6 cm nodule in the left thyroid  lobe.No mediastinal, hilar, or axillary lymphadenopathy.   Lungs/Pleura: The midline trachea and bronchi are patent. Mild centrilobular emphysema. No focal airspace consolidation, pleural effusion, or pneumothorax. Multifocal fibrolinear scarring and subsegmental atelectasis in the lung bases. 4 mm right lower lobe nodule, which in retrospect, is unchanged, likely benign.   Musculoskeletal: No acute fracture or destructive bone lesion. Mild bilateral glenohumeral joint osteoarthritis. Multilevel degenerative disc disease of the spine. Thoracic DISH.   Upper Abdomen: Decreased size of the spleen with interval development of a multinodular contour. Similar appearance of a couple of subcentimeter hypodensities in the right and left hepatic lobes.   IMPRESSION: 1. Similar fusiform aneurysm of the ascending aorta, measuring 4.9 cm. Continued follow-up should be considered as documented below. 2. Mild centrilobular emphysema. No pneumonia, pulmonary edema, or pleural effusion. 3. Decreased size of the  spleen with interval development of a multinodular contour. While this could represent interval scarring, a nonemergent multiphase abdominal MRI with IV contrast is recommended to exclude underlying splenic masses, such as in metastatic disease or lymphoma.   Ascending thoracic aortic aneurysm. Recommend semi-annual imaging followup by CTA or MRA and referral to cardiothoracic surgery if not already obtained. This recommendation follows 2010 ACCF/AHA/AATS/ACR/ASA/SCA/SCAI/SIR/STS/SVM Guidelines for the Diagnosis and Management of Patients With Thoracic Aortic Disease. Circulation. 2010; 121: Z733-z630. Aortic aneurysm NOS (ICD10-I71.9) Aortic Atherosclerosis (ICD10-I70.0).   These results will be called to the ordering clinician or representative by the Radiologist Assistant and communication documented in the PACS or [employer].     Electronically Signed   By: Rogelia Myers M.D.   On: 06/05/2024 10:35 I personally reviewed the CT images.  Stable 4.9 to 5 cm ascending aneurysm  Impression: Timothy Moon is a 68 year old man with a history of hypertension, tobacco abuse, stage III chronic kidney disease, atrial fibrillation, congestive heart failure, and ascending aortic aneurysm.  Ascending aneurysm-stable at 4.9 cm.  Needs semiannual follow-up.  Emphasized the importance of regular follow-up and blood pressure control with Timothy Moon.  Hypertension-blood pressure well-controlled on current regimen.  I did recommend that he get a  blood pressure cuff and check himself on a regular basis.  Chronic systolic heart failure-symptoms well-controlled.  Followed by Dr. Cherrie  Substance use-emphasized the importance of avoiding cocaine and tobacco  Plan: Monitor blood pressure at home Return in 6 months with CT chest follow-up ascending aneurysm.  Will avoid contrast unless absolutely necessary due to chronic kidney disease.  I spent over 20 minutes in review of  records, images, and consultation with Timothy Moon today. Elspeth JAYSON Millers, MD [facility] [phone]

## 2024-08-14 ENCOUNTER — Other Ambulatory Visit (HOSPITAL_COMMUNITY): Payer: Self-pay

## 2024-08-15 ENCOUNTER — Other Ambulatory Visit: Payer: Self-pay

## 2024-08-15 ENCOUNTER — Other Ambulatory Visit (HOSPITAL_COMMUNITY): Payer: Self-pay | Admitting: Internal Medicine

## 2024-08-15 ENCOUNTER — Other Ambulatory Visit (HOSPITAL_COMMUNITY): Payer: Self-pay

## 2024-08-15 ENCOUNTER — Other Ambulatory Visit (HOSPITAL_COMMUNITY): Payer: Self-pay | Admitting: Physician Assistant

## 2024-08-15 MED ORDER — FUROSEMIDE 40 MG PO TABS
80.0000 mg | ORAL_TABLET | Freq: Every day | ORAL | 0 refills | Status: DC
  Filled 2024-08-15: qty 120, 60d supply, fill #0

## 2024-08-15 MED ORDER — CARVEDILOL 12.5 MG PO TABS
12.5000 mg | ORAL_TABLET | Freq: Two times a day (BID) | ORAL | 1 refills | Status: DC
  Filled 2024-08-15: qty 90, 45d supply, fill #0
  Filled 2024-09-17: qty 60, 30d supply, fill #1
  Filled 2024-11-20: qty 30, 15d supply, fill #2

## 2024-08-25 NOTE — Progress Notes (Unsigned)
  Electrophysiology Office Note:   Date:  08/25/2024  ID:  Timothy Moon, DOB [birth year], MRN [id number]  Primary Cardiologist: Redell Shallow, MD Primary Heart Failure: None Electrophysiologist: Eason Housman Gladis Norton, MD  {Click to update primary MD,subspecialty MD or APP then REFRESH:1}    History of Present Illness:   Timothy Moon is a 68 y.o. male with h/o poorly controlled hypertension, alcohol use, cocaine and tobacco use, chronic systolic heart failure seen today for routine electrophysiology followup.   Since last being seen in our clinic the patient reports doing ***.  he denies chest pain, palpitations, dyspnea, PND, orthopnea, nausea, vomiting, dizziness, syncope, edema, weight gain, or early satiety.   Review of systems complete and found to be negative unless listed in HPI.   EP Information / Studies Reviewed:    {EKGtoday:28818}        Risk Assessment/Calculations:    CHA2DS2-VASc Score = 6  {Confirm score is correct.  If not, click here to update score.  REFRESH note.  :1} This indicates a 9.7% annual risk of stroke. The patient's score is based upon: CHF History: 1 HTN History: 1 Diabetes History: 0 Stroke History: 2 (TIA) Vascular Disease History: 1 Age Score: 1 Gender Score: 0   {This patient has a significant risk of stroke if diagnosed with atrial fibrillation.  Please consider VKA or DOAC agent for anticoagulation if the bleeding risk is acceptable.   You can also use the SmartPhrase .[email or web address] for documentation.   :[id number]}         Physical Exam:   VS:  There were no vitals taken for this visit.   Wt Readings from Last 3 Encounters:  08/13/24 253 lb (114.8 kg)  07/23/24 253 lb (114.8 kg)  07/05/24 254 lb 6.4 oz (115.4 kg)     GEN: Well nourished, well developed in no acute distress NECK: No JVD; No carotid bruits CARDIAC: {EPRHYTHM:28826}, no murmurs, rubs, gallops RESPIRATORY:  Clear to auscultation without rales, wheezing or rhonchi   ABDOMEN: Soft, non-tender, non-distended EXTREMITIES:  No edema; No deformity   ASSESSMENT AND PLAN:    1.  Longstanding persistent atrial fibrillation: On amiodarone  and carvedilol .***  2.  Secondary hypercoagulable state: On Eliquis   3.  High-risk medication monitoring: On amiodarone .  Recent TSH and LFTs within normal limits  4.  Chronic systolic heart failure: Ejection fraction 25 to 30%.  Fluid status appears stable.  5.  Hypertension:***  6.  Ascending aortic aneurysm: Follows with cardiac surgery  Follow up with {EPMDS:28135::EP Team} {EPFOLLOW LE:71826}  Signed, Shantanu Strauch Gladis Norton, MD

## 2024-08-26 ENCOUNTER — Ambulatory Visit: Attending: Cardiology | Admitting: Cardiology

## 2024-08-26 ENCOUNTER — Encounter: Payer: Self-pay | Admitting: Cardiology

## 2024-08-26 VITALS — BP 133/80 | HR 63 | Ht 75.0 in | Wt 257.0 lb

## 2024-08-26 DIAGNOSIS — D6869 Other thrombophilia: Secondary | ICD-10-CM | POA: Diagnosis not present

## 2024-08-26 DIAGNOSIS — I10 Essential (primary) hypertension: Secondary | ICD-10-CM | POA: Diagnosis not present

## 2024-08-26 DIAGNOSIS — I4811 Longstanding persistent atrial fibrillation: Secondary | ICD-10-CM | POA: Insufficient documentation

## 2024-08-26 DIAGNOSIS — I5022 Chronic systolic (congestive) heart failure: Secondary | ICD-10-CM | POA: Diagnosis not present

## 2024-08-26 NOTE — Patient Instructions (Signed)

## 2024-09-03 ENCOUNTER — Telehealth (HOSPITAL_COMMUNITY): Payer: Self-pay

## 2024-09-03 NOTE — Telephone Encounter (Signed)
 Called to confirm/remind patient of their appointment at the [facility] Failure Clinic on 09/04/2024.   Appointment:   [x] Confirmed  [] Left mess   [] No answer/No voice mail  [] VM Full/unable to leave message  [] Phone not in service  Patient reminded to bring all medications and/or complete list.  Confirmed patient has transportation. Gave directions, instructed to utilize valet parking.

## 2024-09-04 ENCOUNTER — Ambulatory Visit (HOSPITAL_COMMUNITY)
Admission: RE | Admit: 2024-09-04 | Discharge: 2024-09-04 | Disposition: A | Discharge status: Home / Self Care | Source: Ambulatory Visit | Attending: Family Medicine | Admitting: Family Medicine

## 2024-09-04 ENCOUNTER — Ambulatory Visit (HOSPITAL_COMMUNITY): Payer: Self-pay | Admitting: Family Medicine

## 2024-09-04 ENCOUNTER — Encounter (HOSPITAL_COMMUNITY): Payer: Self-pay

## 2024-09-04 VITALS — BP 126/78 | HR 54 | Ht 75.0 in | Wt 261.2 lb

## 2024-09-04 DIAGNOSIS — N1832 Chronic kidney disease, stage 3b: Secondary | ICD-10-CM | POA: Diagnosis not present

## 2024-09-04 DIAGNOSIS — I10 Essential (primary) hypertension: Secondary | ICD-10-CM | POA: Diagnosis not present

## 2024-09-04 DIAGNOSIS — Z7901 Long term (current) use of anticoagulants: Secondary | ICD-10-CM | POA: Insufficient documentation

## 2024-09-04 DIAGNOSIS — E669 Obesity, unspecified: Secondary | ICD-10-CM | POA: Insufficient documentation

## 2024-09-04 DIAGNOSIS — Z79899 Other long term (current) drug therapy: Secondary | ICD-10-CM | POA: Diagnosis not present

## 2024-09-04 DIAGNOSIS — I4891 Unspecified atrial fibrillation: Secondary | ICD-10-CM | POA: Diagnosis not present

## 2024-09-04 DIAGNOSIS — F17210 Nicotine dependence, cigarettes, uncomplicated: Secondary | ICD-10-CM | POA: Diagnosis not present

## 2024-09-04 DIAGNOSIS — I4819 Other persistent atrial fibrillation: Secondary | ICD-10-CM | POA: Insufficient documentation

## 2024-09-04 DIAGNOSIS — I712 Thoracic aortic aneurysm, without rupture, unspecified: Secondary | ICD-10-CM | POA: Diagnosis not present

## 2024-09-04 DIAGNOSIS — I472 Ventricular tachycardia, unspecified: Secondary | ICD-10-CM | POA: Diagnosis not present

## 2024-09-04 DIAGNOSIS — F1491 Cocaine use, unspecified, in remission: Secondary | ICD-10-CM | POA: Diagnosis not present

## 2024-09-04 DIAGNOSIS — Z6832 Body mass index (BMI) 32.0-32.9, adult: Secondary | ICD-10-CM | POA: Diagnosis not present

## 2024-09-04 DIAGNOSIS — R0683 Snoring: Secondary | ICD-10-CM | POA: Insufficient documentation

## 2024-09-04 DIAGNOSIS — F1410 Cocaine abuse, uncomplicated: Secondary | ICD-10-CM

## 2024-09-04 DIAGNOSIS — I130 Hypertensive heart and chronic kidney disease with heart failure and stage 1 through stage 4 chronic kidney disease, or unspecified chronic kidney disease: Secondary | ICD-10-CM | POA: Diagnosis not present

## 2024-09-04 DIAGNOSIS — I5022 Chronic systolic (congestive) heart failure: Secondary | ICD-10-CM | POA: Diagnosis not present

## 2024-09-04 LAB — BASIC METABOLIC PANEL WITH GFR
Anion gap: 9 (ref 5–15)
BUN: 32 mg/dL — ABNORMAL HIGH (ref 8–23)
CO2: 25 mmol/L (ref 22–32)
Calcium: 9.5 mg/dL (ref 8.9–10.3)
Chloride: 105 mmol/L (ref 98–111)
Creatinine, Ser: 2.22 mg/dL — ABNORMAL HIGH (ref 0.61–1.24)
GFR, Estimated: 31 mL/min — ABNORMAL LOW (ref >60)
Glucose, Bld: 106 mg/dL — ABNORMAL HIGH (ref 70–99)
Potassium: 4.2 mmol/L (ref 3.5–5.1)
Sodium: 139 mmol/L (ref 135–145)

## 2024-09-04 LAB — BRAIN NATRIURETIC PEPTIDE: B Natriuretic Peptide: 70.4 pg/mL (ref 0.0–100.0)

## 2024-09-04 NOTE — Progress Notes (Signed)
 [facility]   Primary Care: Corean Comment, NP EP: Dr. Inocencio HF Cardiologist: Dr. Cherrie  HPI: [name] is a 68 y.o. male w/ history of poorly controlled HTN, ETOH use, tobacco use and cocaine use, and recently diagnosed with HFrEF.   Admitted to [facility] 1/23 with Acute HFrEF.  Echo showed moderately reduced LVEF 30 to 35%, mod LVH w/ GIIDD, normal RV. Chest CT also showed thoracic aortic aneurysm. Aortic root measures 4.7 to 4.9 cm. Coronary atherosclerosis also noted. UDS negative. Diuresed w/ IV Lasix . Did not undergo LHC given renal insufficiency, which also limited medical therapy. SCr 2.2 (baseline unknown). No ARNi/ARB/spiro/dig. Placed on Imdur /hydarl, coreg  and farxiga . D/c wt 249 lb.   He was seen in the [facility] 1/23 and referred to the [facility] Failure Clinic. Hypertensive at the visit. Coreg  and hydralazine  increased. Also started on farxiga .   Found to have with a large aortic aneurysm 5 cm by CT on July 2023, he also missed his CT surgery   Lost to follow up after 2023.  Admitted 8/26 - 06/15/24  with fall and found to be in VT in the 200s. Required cardioversion by EMS. UDS + for cocaine. Had been taking his heart meds  Echo with EF 30%; Cath with mild to moderate non-obstructive disease. Not felt to be candidate for ICD given active cocaine use. Started on GDMT.   cMRI 10/25: LVEF 35%, RVEF 55%, cannot r/o sarcoidosis   Today he returns for HF follow up. Overall feeling fine. No SOB with activity, does some yard-work without undue dyspnea. Denies palpitations, abnormal bleeding, CP, dizziness, edema, or PND/Orthopnea. Appetite ok. Weight at home 250s pounds. Taking all medications. Last used cocaine 2 months ago. No ETOH use, smokes 1 cig/day occasionally. Agreeable to ICD.   Cardiac Testing   - cMRI (10/25): LVEF 35%, RVEF 55%, cannot r/o sarcoidosis  - CT 06/05/24 AoRoot 4.9 cm + COPD  - R/LHC (8/25):  pLAD 60%, RCA 30% RPAV 60%; RA11, PA 36/21 (28),  PCWP 23, PAPi 1.4, CO/CI (Fick) 5.06/2.09   - Echo 8/25: EF 25-30%  - Echo 10/19/21: EF 30-35%. Global HK, moderate ventricular hypertrophy, grade II DD, RV normal Aneurysm of the ascending aorta, measuring 43 mm.   Past Medical History:  Diagnosis Date   Acute congestive heart failure (HCC) 10/19/2021   Ascending aortic aneurysm 11/23/2021   4.9 cm in January 2023   Chronic kidney disease, stage 3a (HCC) 10/19/2021   Cocaine abuse (HCC) 10/19/2021   Elevated troponin level not due myocardial infarction 10/19/2021   Erythrocytosis 06/11/2024   Essential hypertension 10/19/2021   Hypertensive urgency 10/19/2021   Hypokalemia 10/19/2021   Hyponatremia 06/11/2024   Leukocytosis 06/11/2024   Nicotine dependence, cigarettes, uncomplicated 10/19/2021   Ventricular tachycardia (HCC) 06/11/2024   VT (ventricular tachycardia) (HCC) 06/12/2024   Current Outpatient Medications  Medication Sig Dispense Refill   amiodarone  (PACERONE ) 200 MG tablet Take 1 tablet (200 mg total) by mouth daily. 30 tablet 6   apixaban  (ELIQUIS ) 5 MG TABS tablet Take 1 tablet (5 mg total) by mouth 2 (two) times daily. 60 tablet 3   aspirin  EC 81 MG tablet Take 81-162 mg by mouth every 4 (four) hours as needed for moderate pain (pain score 4-6). Swallow whole. (Patient taking differently: Take 162 mg by mouth as needed for moderate pain (pain score 4-6). Swallow whole.)     carvedilol  (COREG ) 12.5 MG tablet Take 1 tablet (12.5 mg total) by mouth 2 (  two) times daily with a meal. 90 tablet 1   dapagliflozin  propanediol (FARXIGA ) 10 MG TABS tablet Take 1 tablet (10 mg total) by mouth daily. 30 tablet 0   furosemide  (LASIX ) 40 MG tablet Take 2 tablets (80 mg total) by mouth daily. 120 tablet 0   hydrALAZINE  (APRESOLINE ) 100 MG tablet Take 1 tablet (100 mg total) by mouth 3 (three) times daily. 90 tablet 6   isosorbide  mononitrate (IMDUR ) 60 MG 24 hr tablet Take 1 tablet (60 mg total) by mouth daily. NEEDS FOLLOW UP  APPOINTMENT FOR MORE REFILLS 90 tablet 0   mexiletine (MEXITIL ) 150 MG capsule Take 1 capsule (150 mg total) by mouth every 12 (twelve) hours. 60 capsule 6   Multiple Vitamin (MULTIVITAMIN) capsule Take 1 capsule by mouth as needed. Centrum Silver for men 90 capsule 0   nitroGLYCERIN  (NITROSTAT ) 0.4 MG SL tablet Place 1 tablet (0.4 mg total) under the tongue every 5 (five) minutes as needed for chest pain. 30 tablet 12   rosuvastatin  (CRESTOR ) 10 MG tablet Take 1 tablet (10 mg total) by mouth daily. 30 tablet 1   sacubitril -valsartan  (ENTRESTO ) 24-26 MG Take 1 tablet by mouth 2 (two) times daily. 60 tablet 1   spironolactone  (ALDACTONE ) 25 MG tablet Take 1/2 tablets (12.5 mg total) by mouth daily. 45 tablet 3   No current facility-administered medications for this encounter.   No Known Allergies  Social History   Socioeconomic History   Marital status: Divorced    Spouse name: Not on file   Number of children: 2   Years of education: Not on file   Highest education level: Associate degree: academic program  Occupational History   Occupation: retired  Tobacco Use   Smoking status: Former    Current packs/day: 0.00    Average packs/day: 1 pack/day for 20.0 years (20.0 ttl pk-yrs)    Types: Cigarettes    Start date: 10/17/2001    Quit date: 10/17/2021    Years since quitting: 2.8   Smokeless tobacco: Never   Tobacco comments:    Former smoker 04/24/24  Vaping Use   Vaping status: Never Used  Substance and Sexual Activity   Alcohol use: Not Currently    Comment: heavy drinker x 7 years, quit 11 years ago.   Drug use: Not Currently    Types: Cocaine    Comment: 1-2x/mo last use 10/09/2021   Sexual activity: Not Currently  Other Topics Concern   Not on file  Social History Narrative   Not on file   Social Drivers of Health   Financial Resource Strain: Low Risk  (07/25/2022)   Overall Financial Resource Strain (CARDIA)    Difficulty of Paying Living Expenses: Not very hard   Food Insecurity: No Food Insecurity (06/18/2024)   Hunger Vital Sign    Worried About Running Out of Food in the Last Year: Never true    Ran Out of Food in the Last Year: Never true  Transportation Needs: No Transportation Needs (06/18/2024)   PRAPARE - Administrator, Civil Service (Medical): No    Lack of Transportation (Non-Medical): No  Physical Activity: Inactive (07/25/2022)   Exercise Vital Sign    Days of Exercise per Week: 0 days    Minutes of Exercise per Session: 0 min  Stress: No Stress Concern Present (07/25/2022)   [employer] of Occupational Health - Occupational Stress Questionnaire    Feeling of Stress : Only a little  Social Connections: Unknown (06/15/2024)  Social Connection and Isolation Panel    Frequency of Communication with Friends and Family: More than three times a week    Frequency of Social Gatherings with Friends and Family: More than three times a week    Attends Religious Services: Not on file    Active Member of Clubs or Organizations: No    Attends Banker Meetings: Never    Marital Status: Divorced  Catering Manager Violence: Not At Risk (06/18/2024)   Humiliation, Afraid, Rape, and Kick questionnaire    Fear of Current or Ex-Partner: No    Emotionally Abused: No    Physically Abused: No    Sexually Abused: No   Family History  Problem Relation Age of Onset   Heart disease Neg Hx    BP 126/78   Pulse (!) 54   Ht 6' 3 (1.905 m)   Wt 118.5 kg (261 lb 3.2 oz)   SpO2 94%   BMI 32.65 kg/m   Wt Readings from Last 3 Encounters:  09/04/24 118.5 kg (261 lb 3.2 oz)  08/26/24 116.6 kg (257 lb)  08/13/24 114.8 kg (253 lb)   PHYSICAL EXAM: General:  NAD. No resp difficulty, walked into clinic HEENT: Normal Neck: Supple. No JVD. Thick neck Cor: Regular rate & rhythm. No rubs, gallops or murmurs. Lungs: Clear, diminished in bases Abdomen: Soft, obese, nontender, nondistended.  Extremities: No cyanosis, clubbing, rash,  edema Neuro: Alert & oriented x 3, moves all 4 extremities w/o difficulty. Affect pleasant.  ReDs: 32%  ASSESSMENT & PLAN:  Chronic Systolic Heart Failure -  Echo 1/23: EF 30 to 35%, mod LVH w/ GIIDD, normal RV (no prior study for comparison) -  Etiology uncertain. 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 He will need cath if renal fx improves (chest CT + coronary calcifications, multiple RFs, ETOH and cocaine may also contribute) - 8/25 admitted with VT - Echo 06/12/24 EF 25-30% - Cath 8/25 pLAD 60%, RCA 30% RPAV 60%  - cMRI (10/25): LVEF 35%, RVEF 55%, cannot r/o sarcoidosis - With arrhythmias, arrange cPET to r/o cardiac sarcoidosis, check ACE level today. Discussed with Dr. Zenaida - NYHA II. Volume ok ReDs 32% - Continue Lasix  80 mg daily - Continue carvedilol  12.5 mg bid - Continue hydralazine  100 mg tid + Imdur  60 mg daily - Continue Farxiga  10 mg daily.  - Continue Entresto  24/26 mg bid. - Continue spironolactone  12.5 mg daily - Referred to CR  - Back to EP for ICD - Labs today  2. Atrial fibrillation, persistent - s/p DC-CV 06/19/24 - Seen by EP 07/01/24.  - Continue amio 200 daily. No ablation at this time - Continue Eliquis  5 bid. No bleeding - Continue to follow with AF Clinic - Needs sleep study   3. HTN - Long h/o poor control. - BP now well-controlled - Meds as above  4. VT - Sustained VT in 8/25 in setting of cocaine use - Seen by EP.  - On mexilitene 150 bid and amio 200 mg daily. - No cocaine in 2 months. - Agreeable to ICD, I asked him to call EP and schedule procedure.   5. CKD 3b - Recent hospitalization w/ SCr elevated at 2.2 - ? Hypertensive nephropathy. BP control per above.  - Continue Farxiga  10 mg daily. - Labs today.   6. Thoracic aortic aneurysm - aortic root measured 4.7 to 4.9 cm on noncontrast chest CT 1/23. - CT 06/05/24 AoRoot 4.9 cm + COPD - Follows with  Dr. Kerrin - Tight BP control - On ? blocker.  7.  Cocaine abuse - None since 05/2024 admission - Discussed importance of on-going cessation  8. Snoring - Refer to [facility] for sleep study  9. Obesity - Body mass index is 32.65 kg/m. - Consider GLP1-RA  Follow up in 3 months with Dr. Cherrie  Harlene CHRISTELLA Gainer, FNP  9:58 AM

## 2024-09-04 NOTE — Patient Instructions (Addendum)
 Good to see you today!   Labs done today, your results will be available in MyChart, we will contact you for abnormal readings.    Please report to Radiology at the [facility] Main Entrance 30 minutes early for your test.  [address] [place], [place] [address]                         OR   Please report to Radiology at [facility] Main Entrance, medical mall, 30 mins prior to your test.  [address]  [place], [place]  How to Prepare for Your Cardiac PET/CT Stress Test:  Nothing to eat or drink, except water , 3 hours prior to arrival time.  NO caffeine/decaffeinated products, or chocolate 12 hours prior to arrival. (Please note decaffeinated beverages (teas/coffees) still contain caffeine).  If you have caffeine within 12 hours prior, the test will need to be rescheduled.  Medication instructions: Do not take erectile dysfunction medications for 72 hours prior to test (sildenafil, tadalafil) Do not take nitrates (isosorbide  mononitrate, Ranexa) the day before or day of test Do not take tamsulosin the day before or morning of test Hold theophylline containing medications for 12 hours. Hold Dipyridamole 48 hours prior to the test.  Diabetic Preparation: If able to eat breakfast prior to 3 hour fasting, you may take all medications, including your insulin. Do not worry if you miss your breakfast dose of insulin - start at your next meal. If you do not eat prior to 3 hour fast-Hold all diabetes (oral and insulin) medications. Patients who wear a continuous glucose monitor MUST remove the device prior to scanning.  You may take your remaining medications with water .  NO perfume, cologne or lotion on chest or abdomen area. FEMALES - Please avoid wearing dresses to this appointment.  Total time is 1 to 2 hours; you may want to bring reading material for the waiting time.  IF YOU THINK YOU MAY BE PREGNANT, OR ARE NURSING PLEASE INFORM THE  TECHNOLOGIST.  In preparation for your appointment, medication and supplies will be purchased.  Appointment availability is limited, so if you need to cancel or reschedule, please call the Radiology Department Scheduler at [phone] 24 hours in advance to avoid a cancellation fee of $100.00  What to Expect When you Arrive:  Once you arrive and check in for your appointment, you will be taken to a preparation room within the Radiology Department.  A technologist or Nurse will obtain your medical history, verify that you are correctly prepped for the exam, and explain the procedure.  Afterwards, an IV will be started in your arm and electrodes will be placed on your skin for EKG monitoring during the stress portion of the exam. Then you will be escorted to the PET/CT scanner.  There, staff will get you positioned on the scanner and obtain a blood pressure and EKG.  During the exam, you will continue to be connected to the EKG and blood pressure machines.  A small, safe amount of a radioactive tracer will be injected in your IV to obtain a series of pictures of your heart along with an injection of a stress agent.    After your Exam:  It is recommended that you eat a meal and drink a caffeinated beverage to counter act any effects of the stress agent.  Drink plenty of fluids for the remainder of the day and urinate frequently for the first  couple of hours after the exam.  Your doctor will inform you of your test results within 7-10 business days.  For more information and frequently asked questions, please visit our website: [email or web address]  For questions about your test or how to prepare for your test, please call: Cardiac Imaging Nurse Navigators Office: [phone]  Your physician recommends that you schedule a follow-up appointment 3 months(February) Call office in December to schedule an appointment  You have been referred to pulmonary they will call to schedule an  appointment  You have been referred to cardiac rehab they will call you to schedule  Follow up  with Dr. Inocencio about ICD If you have any questions or concerns before your next appointment please send us  a message through [facility] or call our office at [phone].    TO LEAVE A MESSAGE FOR THE NURSE SELECT OPTION 2, PLEASE LEAVE A MESSAGE INCLUDING: YOUR NAME DATE OF BIRTH CALL BACK NUMBER REASON FOR CALL**this is important as we prioritize the call backs  YOU WILL RECEIVE A CALL BACK THE SAME DAY AS LONG AS YOU CALL BEFORE 4:00 PM  At the [facility], you and your health needs are our priority. As part of our continuing mission to provide you with exceptional heart care, we have created designated Provider Care Teams. These Care Teams include your primary Cardiologist (physician) and Advanced Practice Providers (APPs- Physician Assistants and Nurse Practitioners) who all work together to provide you with the care you need, when you need it.   You may see any of the following providers on your designated Care Team at your next follow up: Dr Toribio Fuel Dr Ezra Shuck Dr. Morene Brownie Greig Mosses, NP Caffie Shed, [place] [facility] [place], [place] Beckey Coe, NP Jordan Lee, NP Ellouise Class, NP Tinnie Redman, PharmD Jaun Bash, PharmD   Please be sure to bring in all your medications bottles to every appointment.    Thank you for choosing [facility]

## 2024-09-04 NOTE — Progress Notes (Signed)
 ReDS Vest / Clip - 09/04/24 1000       ReDS Vest / Clip   Station Marker D    Ruler Value 37    ReDS Value Range Low volume    ReDS Actual Value 32

## 2024-09-05 LAB — ANGIOTENSIN CONVERTING ENZYME: Angiotensin-Converting Enzyme: 45 U/L (ref 14–82)

## 2024-09-11 ENCOUNTER — Telehealth (HOSPITAL_COMMUNITY): Payer: Self-pay

## 2024-09-11 NOTE — Telephone Encounter (Signed)
 Attempted to schedule cardiac rehab- no answer, left message. Sent MyChart message.

## 2024-09-11 NOTE — Telephone Encounter (Signed)
 Pt insurance is active and benefits verified through Medicare B. Co-pay $0, DED $257/$257 met, out of pocket $0/$0 met, co-insurance 20%. No pre-authorization required. Passport, 09/11/2024 @ 1:21pm, REF# [id number].  TCR/ICR? ICR Visit(date of service)limitation? No Can multiple codes be used on the same date of service/visit?(IF ITS A LIMIT) N/A  Is this a lifetime maximum or an annual maximum? Annual Has the member used any of these services to date? No Is there a time limit (weeks/months) on start of program and/or program completion? No

## 2024-09-17 ENCOUNTER — Other Ambulatory Visit (HOSPITAL_COMMUNITY): Payer: Self-pay | Admitting: Internal Medicine

## 2024-09-17 ENCOUNTER — Other Ambulatory Visit (HOSPITAL_COMMUNITY): Payer: Self-pay

## 2024-09-17 ENCOUNTER — Other Ambulatory Visit: Payer: Self-pay

## 2024-09-17 MED ORDER — ISOSORBIDE MONONITRATE ER 60 MG PO TB24
60.0000 mg | ORAL_TABLET | Freq: Every day | ORAL | 0 refills | Status: AC
  Filled 2024-09-17: qty 30, 30d supply, fill #0
  Filled 2024-10-16: qty 30, 30d supply, fill #1
  Filled 2024-11-20 (×2): qty 30, 30d supply, fill #2

## 2024-09-17 MED ORDER — FUROSEMIDE 40 MG PO TABS
80.0000 mg | ORAL_TABLET | Freq: Every day | ORAL | 0 refills | Status: DC
  Filled 2024-09-17 – 2024-10-16 (×2): qty 120, 60d supply, fill #0

## 2024-09-25 ENCOUNTER — Other Ambulatory Visit (HOSPITAL_COMMUNITY): Payer: Self-pay

## 2024-09-25 ENCOUNTER — Telehealth (HOSPITAL_COMMUNITY): Payer: Self-pay

## 2024-09-25 NOTE — Telephone Encounter (Signed)
 F/u call regarding cardiac rehab- no answer, left message.  Closing referral.

## 2024-10-18 ENCOUNTER — Other Ambulatory Visit: Payer: Self-pay

## 2024-10-18 ENCOUNTER — Other Ambulatory Visit (HOSPITAL_COMMUNITY): Payer: Self-pay

## 2024-11-19 ENCOUNTER — Telehealth (HOSPITAL_COMMUNITY): Payer: Self-pay | Admitting: Emergency Medicine

## 2024-11-19 NOTE — Telephone Encounter (Signed)
 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

## 2024-11-20 ENCOUNTER — Other Ambulatory Visit (HOSPITAL_COMMUNITY): Payer: Self-pay

## 2024-11-20 ENCOUNTER — Encounter (HOSPITAL_COMMUNITY): Payer: Self-pay

## 2024-11-20 ENCOUNTER — Other Ambulatory Visit: Payer: Self-pay

## 2024-11-20 ENCOUNTER — Other Ambulatory Visit (HOSPITAL_COMMUNITY): Payer: Self-pay | Admitting: Internal Medicine

## 2024-11-20 MED ORDER — CARVEDILOL 12.5 MG PO TABS
12.5000 mg | ORAL_TABLET | Freq: Two times a day (BID) | ORAL | 5 refills | Status: AC
  Filled 2024-11-20: qty 60, 30d supply, fill #0

## 2024-11-20 MED ORDER — FUROSEMIDE 40 MG PO TABS
80.0000 mg | ORAL_TABLET | Freq: Every day | ORAL | 0 refills | Status: AC
  Filled 2024-11-20: qty 120, 60d supply, fill #0

## 2024-11-21 ENCOUNTER — Encounter (HOSPITAL_COMMUNITY): Payer: Medicare (Managed Care)

## 2024-12-19 ENCOUNTER — Encounter (HOSPITAL_COMMUNITY): Payer: Medicare (Managed Care)
